# Patient Record
Sex: Female | Born: 1970 | Race: White | Hispanic: No | Smoking: Never smoker
Health system: Southern US, Community
[De-identification: ages and names within clinical notes are randomized; demographics above are authoritative.]

## PROBLEM LIST (undated history)

## (undated) DIAGNOSIS — E785 Hyperlipidemia, unspecified: Secondary | ICD-10-CM

## (undated) DIAGNOSIS — F32A Depression, unspecified: Secondary | ICD-10-CM

## (undated) DIAGNOSIS — I10 Essential (primary) hypertension: Secondary | ICD-10-CM

## (undated) DIAGNOSIS — I679 Cerebrovascular disease, unspecified: Secondary | ICD-10-CM

## (undated) DIAGNOSIS — E119 Type 2 diabetes mellitus without complications: Secondary | ICD-10-CM

## (undated) DIAGNOSIS — I639 Cerebral infarction, unspecified: Secondary | ICD-10-CM

## (undated) HISTORY — PX: OTHER SURGICAL HISTORY: SHX169

## (undated) HISTORY — DX: Cerebrovascular disease, unspecified: I67.9

---

## 2012-11-17 ENCOUNTER — Ambulatory Visit: Payer: Self-pay | Admitting: Endocrinology

## 2020-09-24 ENCOUNTER — Other Ambulatory Visit: Payer: Self-pay

## 2020-09-24 ENCOUNTER — Emergency Department (HOSPITAL_COMMUNITY): Payer: BC Managed Care – PPO

## 2020-09-24 ENCOUNTER — Inpatient Hospital Stay (HOSPITAL_COMMUNITY)
Admission: EM | Admit: 2020-09-24 | Discharge: 2020-10-01 | DRG: 065 | Disposition: A | Discharge status: Rehabilitation Facility | Payer: BC Managed Care – PPO | Attending: Internal Medicine | Admitting: Internal Medicine

## 2020-09-24 ENCOUNTER — Encounter (HOSPITAL_COMMUNITY): Payer: Self-pay | Admitting: Radiology

## 2020-09-24 ENCOUNTER — Observation Stay (HOSPITAL_COMMUNITY): Payer: BC Managed Care – PPO

## 2020-09-24 DIAGNOSIS — I6302 Cerebral infarction due to thrombosis of basilar artery: Secondary | ICD-10-CM

## 2020-09-24 DIAGNOSIS — E872 Acidosis: Secondary | ICD-10-CM | POA: Diagnosis present

## 2020-09-24 DIAGNOSIS — E871 Hypo-osmolality and hyponatremia: Secondary | ICD-10-CM | POA: Diagnosis not present

## 2020-09-24 DIAGNOSIS — E781 Pure hyperglyceridemia: Secondary | ICD-10-CM | POA: Diagnosis present

## 2020-09-24 DIAGNOSIS — Z823 Family history of stroke: Secondary | ICD-10-CM

## 2020-09-24 DIAGNOSIS — Z79899 Other long term (current) drug therapy: Secondary | ICD-10-CM

## 2020-09-24 DIAGNOSIS — G8191 Hemiplegia, unspecified affecting right dominant side: Secondary | ICD-10-CM | POA: Diagnosis present

## 2020-09-24 DIAGNOSIS — D751 Secondary polycythemia: Secondary | ICD-10-CM | POA: Diagnosis not present

## 2020-09-24 DIAGNOSIS — R29810 Facial weakness: Secondary | ICD-10-CM | POA: Diagnosis present

## 2020-09-24 DIAGNOSIS — R739 Hyperglycemia, unspecified: Secondary | ICD-10-CM | POA: Diagnosis not present

## 2020-09-24 DIAGNOSIS — F32A Depression, unspecified: Secondary | ICD-10-CM | POA: Diagnosis present

## 2020-09-24 DIAGNOSIS — B373 Candidiasis of vulva and vagina: Secondary | ICD-10-CM | POA: Diagnosis not present

## 2020-09-24 DIAGNOSIS — I6322 Cerebral infarction due to unspecified occlusion or stenosis of basilar arteries: Principal | ICD-10-CM | POA: Diagnosis present

## 2020-09-24 DIAGNOSIS — E785 Hyperlipidemia, unspecified: Secondary | ICD-10-CM | POA: Diagnosis present

## 2020-09-24 DIAGNOSIS — I639 Cerebral infarction, unspecified: Secondary | ICD-10-CM | POA: Diagnosis present

## 2020-09-24 DIAGNOSIS — Z20822 Contact with and (suspected) exposure to covid-19: Secondary | ICD-10-CM | POA: Diagnosis present

## 2020-09-24 DIAGNOSIS — I10 Essential (primary) hypertension: Secondary | ICD-10-CM | POA: Diagnosis present

## 2020-09-24 DIAGNOSIS — E119 Type 2 diabetes mellitus without complications: Secondary | ICD-10-CM

## 2020-09-24 DIAGNOSIS — Z880 Allergy status to penicillin: Secondary | ICD-10-CM

## 2020-09-24 DIAGNOSIS — E669 Obesity, unspecified: Secondary | ICD-10-CM | POA: Diagnosis present

## 2020-09-24 DIAGNOSIS — R260 Ataxic gait: Secondary | ICD-10-CM | POA: Diagnosis present

## 2020-09-24 DIAGNOSIS — Z87892 Personal history of anaphylaxis: Secondary | ICD-10-CM

## 2020-09-24 DIAGNOSIS — E1165 Type 2 diabetes mellitus with hyperglycemia: Secondary | ICD-10-CM

## 2020-09-24 DIAGNOSIS — E1159 Type 2 diabetes mellitus with other circulatory complications: Secondary | ICD-10-CM

## 2020-09-24 DIAGNOSIS — I809 Phlebitis and thrombophlebitis of unspecified site: Secondary | ICD-10-CM | POA: Diagnosis present

## 2020-09-24 DIAGNOSIS — Z9181 History of falling: Secondary | ICD-10-CM

## 2020-09-24 DIAGNOSIS — F419 Anxiety disorder, unspecified: Secondary | ICD-10-CM | POA: Diagnosis present

## 2020-09-24 DIAGNOSIS — R29703 NIHSS score 3: Secondary | ICD-10-CM | POA: Diagnosis present

## 2020-09-24 DIAGNOSIS — Z888 Allergy status to other drugs, medicaments and biological substances status: Secondary | ICD-10-CM

## 2020-09-24 HISTORY — DX: Depression, unspecified: F32.A

## 2020-09-24 HISTORY — DX: Type 2 diabetes mellitus without complications: E11.9

## 2020-09-24 HISTORY — DX: Hyperlipidemia, unspecified: E78.5

## 2020-09-24 HISTORY — DX: Essential (primary) hypertension: I10

## 2020-09-24 LAB — CBC
HCT: 46.2 % — ABNORMAL HIGH (ref 36.0–46.0)
Hemoglobin: 15.2 g/dL — ABNORMAL HIGH (ref 12.0–15.0)
MCH: 25.9 pg — ABNORMAL LOW (ref 26.0–34.0)
MCHC: 32.9 g/dL (ref 30.0–36.0)
MCV: 78.6 fL — ABNORMAL LOW (ref 80.0–100.0)
Platelets: 247 10*3/uL (ref 150–400)
RBC: 5.88 MIL/uL — ABNORMAL HIGH (ref 3.87–5.11)
RDW: 14.6 % (ref 11.5–15.5)
WBC: 10.7 10*3/uL — ABNORMAL HIGH (ref 4.0–10.5)
nRBC: 0 % (ref 0.0–0.2)

## 2020-09-24 LAB — RESP PANEL BY RT-PCR (FLU A&B, COVID) ARPGX2
Influenza A by PCR: NEGATIVE
Influenza B by PCR: NEGATIVE
SARS Coronavirus 2 by RT PCR: NEGATIVE

## 2020-09-24 LAB — DIFFERENTIAL
Abs Immature Granulocytes: 0.03 10*3/uL (ref 0.00–0.07)
Basophils Absolute: 0.1 10*3/uL (ref 0.0–0.1)
Basophils Relative: 1 %
Eosinophils Absolute: 0.2 10*3/uL (ref 0.0–0.5)
Eosinophils Relative: 2 %
Immature Granulocytes: 0 %
Lymphocytes Relative: 24 %
Lymphs Abs: 2.5 10*3/uL (ref 0.7–4.0)
Monocytes Absolute: 0.6 10*3/uL (ref 0.1–1.0)
Monocytes Relative: 6 %
Neutro Abs: 7.2 10*3/uL (ref 1.7–7.7)
Neutrophils Relative %: 67 %

## 2020-09-24 LAB — URINALYSIS, ROUTINE W REFLEX MICROSCOPIC
Bilirubin Urine: NEGATIVE
Glucose, UA: >=500 mg/dL — AB
Hgb urine dipstick: NEGATIVE
Ketones, ur: 80 mg/dL — AB
Nitrite: POSITIVE — AB
Protein, ur: NEGATIVE mg/dL
Specific Gravity, Urine: >1.046 — ABNORMAL HIGH (ref 1.005–1.030)
pH: 5 (ref 5.0–8.0)

## 2020-09-24 LAB — COMPREHENSIVE METABOLIC PANEL
ALT: 19 U/L (ref 0–44)
AST: 14 U/L — ABNORMAL LOW (ref 15–41)
Albumin: 3.7 g/dL (ref 3.5–5.0)
Alkaline Phosphatase: 66 U/L (ref 38–126)
Anion gap: 12 (ref 5–15)
BUN: 13 mg/dL (ref 6–20)
CO2: 22 mmol/L (ref 22–32)
Calcium: 9.1 mg/dL (ref 8.9–10.3)
Chloride: 99 mmol/L (ref 98–111)
Creatinine, Ser: 0.59 mg/dL (ref 0.44–1.00)
GFR, Estimated: >60 mL/min (ref >60)
Glucose, Bld: 310 mg/dL — ABNORMAL HIGH (ref 70–99)
Potassium: 4 mmol/L (ref 3.5–5.1)
Sodium: 133 mmol/L — ABNORMAL LOW (ref 135–145)
Total Bilirubin: 1.5 mg/dL — ABNORMAL HIGH (ref 0.3–1.2)
Total Protein: 6.5 g/dL (ref 6.5–8.1)

## 2020-09-24 LAB — I-STAT CHEM 8, ED
BUN: 13 mg/dL (ref 6–20)
Calcium, Ion: 1.13 mmol/L — ABNORMAL LOW (ref 1.15–1.40)
Chloride: 101 mmol/L (ref 98–111)
Creatinine, Ser: 0.4 mg/dL — ABNORMAL LOW (ref 0.44–1.00)
Glucose, Bld: 314 mg/dL — ABNORMAL HIGH (ref 70–99)
HCT: 47 % — ABNORMAL HIGH (ref 36.0–46.0)
Hemoglobin: 16 g/dL — ABNORMAL HIGH (ref 12.0–15.0)
Potassium: 4.1 mmol/L (ref 3.5–5.1)
Sodium: 135 mmol/L (ref 135–145)
TCO2: 23 mmol/L (ref 22–32)

## 2020-09-24 LAB — CBG MONITORING, ED
Glucose-Capillary: 299 mg/dL — ABNORMAL HIGH (ref 70–99)
Glucose-Capillary: 295 mg/dL — ABNORMAL HIGH (ref 70–99)

## 2020-09-24 LAB — I-STAT BETA HCG BLOOD, ED (MC, WL, AP ONLY): I-stat hCG, quantitative: <5 m[IU]/mL (ref <5)

## 2020-09-24 LAB — PROTIME-INR
INR: 1 (ref 0.8–1.2)
Prothrombin Time: 13 seconds (ref 11.4–15.2)

## 2020-09-24 LAB — APTT: aPTT: 23 seconds — ABNORMAL LOW (ref 24–36)

## 2020-09-24 LAB — CK: Total CK: 112 U/L (ref 38–234)

## 2020-09-24 MED ORDER — STROKE: EARLY STAGES OF RECOVERY BOOK
Freq: Once | Status: AC
  Filled 2020-09-24: qty 1

## 2020-09-24 MED ORDER — LORAZEPAM 2 MG/ML IJ SOLN
0.5000 mg | Freq: Once | INTRAMUSCULAR | Status: AC
  Administered 2020-09-24: 0.5 mg via INTRAVENOUS
  Filled 2020-09-24: qty 1

## 2020-09-24 MED ORDER — INSULIN ASPART 100 UNIT/ML IJ SOLN
0.0000 [IU] | Freq: Three times a day (TID) | INTRAMUSCULAR | Status: DC
  Administered 2020-09-24: 3 [IU] via SUBCUTANEOUS
  Administered 2020-09-25: 4 [IU] via SUBCUTANEOUS

## 2020-09-24 MED ORDER — SODIUM CHLORIDE 0.9 % IV BOLUS
1000.0000 mL | Freq: Once | INTRAVENOUS | Status: AC
  Administered 2020-09-24: 1000 mL via INTRAVENOUS

## 2020-09-24 MED ORDER — IOHEXOL 350 MG/ML SOLN
75.0000 mL | Freq: Once | INTRAVENOUS | Status: AC | PRN
  Administered 2020-09-24: 75 mL via INTRAVENOUS

## 2020-09-24 MED ORDER — SODIUM CHLORIDE 0.9% FLUSH
3.0000 mL | Freq: Once | INTRAVENOUS | Status: AC
Start: 2020-09-24 — End: 2020-09-24
  Administered 2020-09-24: 3 mL via INTRAVENOUS

## 2020-09-24 MED ORDER — ASPIRIN 325 MG PO TABS
325.0000 mg | ORAL_TABLET | Freq: Every day | ORAL | Status: DC
  Administered 2020-09-24 – 2020-09-25 (×2): 325 mg via ORAL
  Filled 2020-09-24 (×2): qty 1

## 2020-09-24 MED ORDER — ACETAMINOPHEN 325 MG PO TABS
650.0000 mg | ORAL_TABLET | Freq: Four times a day (QID) | ORAL | Status: DC | PRN
  Administered 2020-09-30 (×2): 650 mg via ORAL
  Filled 2020-09-24 (×2): qty 2

## 2020-09-24 MED ORDER — ACETAMINOPHEN 650 MG RE SUPP: 650.0000 mg | Freq: Four times a day (QID) | RECTAL | Status: DC | PRN

## 2020-09-24 NOTE — Consult Note (Signed)
Neurology Consultation Reason for Consult: Right-sided weakness Referring Physician: Para Skeans  CC: Right-sided weakness  History is obtained from: Patient  HPI: Amber [name] is a 50 y.o. female with a history of hypertension who presents with right-sided weakness that has been going on since July 4.  She states that around noon on July 4 she started having right-sided weakness.  Yesterday she fell and was down for about 20 hours.  She states that the right-sided weakness, however has been fairly static since onset.  When her mother found her today, she called 911 to have the patient brought into the emergency department.  Despite being symptomatic for 48 hours,  EMS activated a code stroke and the patient was taken for emergent CT which demonstrated a pontine infarct.  CTA reveals no LVO.   LKW: July 4 tpa given?: no, outside of window   ROS: A 14 point ROS was performed and is negative except as noted in the HPI.    Past Medical History:  Diagnosis Date   Depression    HTN (hypertension)      Family medical history: Mother-stroke   Social History: She denies tobacco Exam: Current vital signs: BP (!) 157/87   Pulse 88   Temp 97.9 F (36.6 C) (Oral)   Resp 12   SpO2 97%  Vital signs in last 24 hours: Temp:  [97.9 F (36.6 C)] 97.9 F (36.6 C) (07/06 1655) Pulse Rate:  [88-98] 88 (07/06 1800) Resp:  [11-18] 12 (07/06 1800) BP: (149-163)/(76-87) 157/87 (07/06 1800) SpO2:  [94 %-100 %] 97 % (07/06 1800)   Physical Exam  Constitutional: Appears well-developed and well-nourished.  Psych: Affect appropriate to situation Eyes: No scleral injection HENT: No OP obstruction MSK: no joint deformities.  Cardiovascular: Normal rate and regular rhythm.  Respiratory: Effort normal, non-labored breathing GI: Soft.  No distension. There is no tenderness.  Skin: WDI  Neuro: Mental Status: Patient is awake, alert, oriented to person, place, month, year, and  situation. Patient is able to give a clear and coherent history. No signs of aphasia or neglect Cranial Nerves: II: Visual Fields are full. Pupils are equal, round, and reactive to light.   III,IV, VI: EOMI without ptosis or diploplia.  V: Facial sensation is symmetric to temperature VII: Facial movement with right facial weakness.  VIII: hearing is intact to voice X: Uvula elevates symmetrically XI: Shoulder shrug is symmetric. XII: tongue is midline without atrophy or fasciculations.  Motor: Tone is normal. Bulk is normal. 5/5 strength was present on the left, she has 4/5 weakness of the right arm and leg.  Sensory: Sensation is diminished in the right arm  Cerebellar: Ataxia out of proportion to weakness in the right arm and leg.      I have reviewed labs in epic and the results pertinent to this consultation are: Cr 0.59 Glucose 310  I have reviewed the images obtained:CT head- pontine and thalamic strokes  Impression: 50 year old female with likely small vessel ischemic stroke.  I suspect that the pontine stroke is acute, and she will need to be admitted for physical therapy and secondary risk factor modification.  Recommendations: - HgbA1c, fasting lipid panel - MRI  of the brain without contrast - Frequent neuro checks - Echocardiogram - Prophylactic therapy-Antiplatelet med: Aspirin - dose 325mg  PO or 300mg  PR - Risk factor modification - Telemetry monitoring - PT consult, OT consult, Speech consult - Stroke team to follow    , MD [facility] [phone]  If 7pm- 7am, please page neurology on call as listed in [place].

## 2020-09-24 NOTE — ED Notes (Signed)
Patient transported to MRI 
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
## 2020-09-25 ENCOUNTER — Encounter (HOSPITAL_COMMUNITY): Payer: Self-pay | Admitting: Internal Medicine

## 2020-09-25 ENCOUNTER — Observation Stay (HOSPITAL_COMMUNITY): Payer: BC Managed Care – PPO

## 2020-09-25 DIAGNOSIS — G8191 Hemiplegia, unspecified affecting right dominant side: Secondary | ICD-10-CM | POA: Diagnosis present

## 2020-09-25 DIAGNOSIS — E872 Acidosis: Secondary | ICD-10-CM | POA: Diagnosis present

## 2020-09-25 DIAGNOSIS — I10 Essential (primary) hypertension: Secondary | ICD-10-CM | POA: Diagnosis present

## 2020-09-25 DIAGNOSIS — F32A Depression, unspecified: Secondary | ICD-10-CM | POA: Diagnosis present

## 2020-09-25 DIAGNOSIS — E669 Obesity, unspecified: Secondary | ICD-10-CM | POA: Diagnosis present

## 2020-09-25 DIAGNOSIS — R29703 NIHSS score 3: Secondary | ICD-10-CM | POA: Diagnosis present

## 2020-09-25 DIAGNOSIS — E781 Pure hyperglyceridemia: Secondary | ICD-10-CM | POA: Diagnosis present

## 2020-09-25 DIAGNOSIS — I6389 Other cerebral infarction: Secondary | ICD-10-CM | POA: Diagnosis not present

## 2020-09-25 DIAGNOSIS — E1165 Type 2 diabetes mellitus with hyperglycemia: Secondary | ICD-10-CM | POA: Diagnosis present

## 2020-09-25 DIAGNOSIS — E785 Hyperlipidemia, unspecified: Secondary | ICD-10-CM | POA: Diagnosis present

## 2020-09-25 DIAGNOSIS — Z79899 Other long term (current) drug therapy: Secondary | ICD-10-CM | POA: Diagnosis not present

## 2020-09-25 DIAGNOSIS — R739 Hyperglycemia, unspecified: Secondary | ICD-10-CM | POA: Diagnosis present

## 2020-09-25 DIAGNOSIS — I809 Phlebitis and thrombophlebitis of unspecified site: Secondary | ICD-10-CM | POA: Diagnosis present

## 2020-09-25 DIAGNOSIS — Z888 Allergy status to other drugs, medicaments and biological substances status: Secondary | ICD-10-CM | POA: Diagnosis not present

## 2020-09-25 DIAGNOSIS — Z880 Allergy status to penicillin: Secondary | ICD-10-CM | POA: Diagnosis not present

## 2020-09-25 DIAGNOSIS — D751 Secondary polycythemia: Secondary | ICD-10-CM | POA: Diagnosis not present

## 2020-09-25 DIAGNOSIS — B373 Candidiasis of vulva and vagina: Secondary | ICD-10-CM | POA: Diagnosis not present

## 2020-09-25 DIAGNOSIS — Z9181 History of falling: Secondary | ICD-10-CM | POA: Diagnosis not present

## 2020-09-25 DIAGNOSIS — I6322 Cerebral infarction due to unspecified occlusion or stenosis of basilar arteries: Secondary | ICD-10-CM | POA: Diagnosis present

## 2020-09-25 DIAGNOSIS — R260 Ataxic gait: Secondary | ICD-10-CM | POA: Diagnosis present

## 2020-09-25 DIAGNOSIS — Z87892 Personal history of anaphylaxis: Secondary | ICD-10-CM | POA: Diagnosis not present

## 2020-09-25 DIAGNOSIS — Z20822 Contact with and (suspected) exposure to covid-19: Secondary | ICD-10-CM | POA: Diagnosis present

## 2020-09-25 DIAGNOSIS — E119 Type 2 diabetes mellitus without complications: Secondary | ICD-10-CM

## 2020-09-25 DIAGNOSIS — R29810 Facial weakness: Secondary | ICD-10-CM | POA: Diagnosis present

## 2020-09-25 DIAGNOSIS — I639 Cerebral infarction, unspecified: Secondary | ICD-10-CM | POA: Diagnosis present

## 2020-09-25 DIAGNOSIS — E871 Hypo-osmolality and hyponatremia: Secondary | ICD-10-CM | POA: Diagnosis not present

## 2020-09-25 DIAGNOSIS — F419 Anxiety disorder, unspecified: Secondary | ICD-10-CM | POA: Diagnosis present

## 2020-09-25 LAB — CBC
HCT: 42.9 % (ref 36.0–46.0)
Hemoglobin: 13.5 g/dL (ref 12.0–15.0)
MCH: 25.5 pg — ABNORMAL LOW (ref 26.0–34.0)
MCHC: 31.5 g/dL (ref 30.0–36.0)
MCV: 81.1 fL (ref 80.0–100.0)
Platelets: 239 K/uL (ref 150–400)
RBC: 5.29 MIL/uL — ABNORMAL HIGH (ref 3.87–5.11)
RDW: 14.7 % (ref 11.5–15.5)
WBC: 8.8 K/uL (ref 4.0–10.5)
nRBC: 0 % (ref 0.0–0.2)

## 2020-09-25 LAB — LIPID PANEL
Cholesterol: 212 mg/dL — ABNORMAL HIGH (ref 0–200)
HDL: 30 mg/dL — ABNORMAL LOW (ref >40)
LDL Cholesterol: 107 mg/dL — ABNORMAL HIGH (ref 0–99)
Total CHOL/HDL Ratio: 7.1 RATIO
Triglycerides: 377 mg/dL — ABNORMAL HIGH (ref <150)
VLDL: 75 mg/dL — ABNORMAL HIGH (ref 0–40)

## 2020-09-25 LAB — ECHOCARDIOGRAM COMPLETE BUBBLE STUDY
Area-P 1/2: 3.91 cm2
S' Lateral: 3.8 cm

## 2020-09-25 LAB — COMPREHENSIVE METABOLIC PANEL
ALT: 17 U/L (ref 0–44)
AST: 12 U/L — ABNORMAL LOW (ref 15–41)
Albumin: 3.3 g/dL — ABNORMAL LOW (ref 3.5–5.0)
Alkaline Phosphatase: 64 U/L (ref 38–126)
Anion gap: 8 (ref 5–15)
BUN: 9 mg/dL (ref 6–20)
CO2: 22 mmol/L (ref 22–32)
Calcium: 8.6 mg/dL — ABNORMAL LOW (ref 8.9–10.3)
Chloride: 105 mmol/L (ref 98–111)
Creatinine, Ser: 0.54 mg/dL (ref 0.44–1.00)
GFR, Estimated: >60 mL/min (ref >60)
Glucose, Bld: 351 mg/dL — ABNORMAL HIGH (ref 70–99)
Potassium: 3.8 mmol/L (ref 3.5–5.1)
Sodium: 135 mmol/L (ref 135–145)
Total Bilirubin: 1.1 mg/dL (ref 0.3–1.2)
Total Protein: 6 g/dL — ABNORMAL LOW (ref 6.5–8.1)

## 2020-09-25 LAB — CBG MONITORING, ED
Glucose-Capillary: 330 mg/dL — ABNORMAL HIGH (ref 70–99)
Glucose-Capillary: 308 mg/dL — ABNORMAL HIGH (ref 70–99)
Glucose-Capillary: 313 mg/dL — ABNORMAL HIGH (ref 70–99)

## 2020-09-25 LAB — HEMOGLOBIN A1C
Hgb A1c MFr Bld: 11.7 % — ABNORMAL HIGH (ref 4.8–5.6)
Mean Plasma Glucose: 289.09 mg/dL

## 2020-09-25 LAB — GLUCOSE, CAPILLARY: Glucose-Capillary: 291 mg/dL — ABNORMAL HIGH (ref 70–99)

## 2020-09-25 LAB — HIV ANTIBODY (ROUTINE TESTING W REFLEX): HIV Screen 4th Generation wRfx: NONREACTIVE

## 2020-09-25 LAB — MAGNESIUM: Magnesium: 1.8 mg/dL (ref 1.7–2.4)

## 2020-09-25 MED ORDER — LACTATED RINGERS IV SOLN: INTRAVENOUS | Status: DC

## 2020-09-25 MED ORDER — CLOPIDOGREL BISULFATE 300 MG PO TABS
300.0000 mg | ORAL_TABLET | Freq: Once | ORAL | Status: AC
  Administered 2020-09-25: 300 mg via ORAL
  Filled 2020-09-25: qty 1

## 2020-09-25 MED ORDER — SERTRALINE HCL 100 MG PO TABS
100.0000 mg | ORAL_TABLET | Freq: Every day | ORAL | Status: DC
  Administered 2020-09-25 – 2020-10-01 (×7): 100 mg via ORAL
  Filled 2020-09-25 (×7): qty 1

## 2020-09-25 MED ORDER — INSULIN GLARGINE 100 UNIT/ML ~~LOC~~ SOLN
10.0000 [IU] | Freq: Every day | SUBCUTANEOUS | Status: DC
  Administered 2020-09-25: 10 [IU] via SUBCUTANEOUS
  Filled 2020-09-25 (×3): qty 0.1

## 2020-09-25 MED ORDER — INSULIN ASPART 100 UNIT/ML IJ SOLN
0.0000 [IU] | Freq: Every day | INTRAMUSCULAR | Status: DC
  Administered 2020-09-25: 3 [IU] via SUBCUTANEOUS
  Administered 2020-09-26: 2 [IU] via SUBCUTANEOUS
  Administered 2020-09-27 – 2020-09-29 (×3): 3 [IU] via SUBCUTANEOUS
  Administered 2020-09-30: 2 [IU] via SUBCUTANEOUS

## 2020-09-25 MED ORDER — ASPIRIN EC 81 MG PO TBEC
81.0000 mg | DELAYED_RELEASE_TABLET | Freq: Every day | ORAL | Status: DC
  Administered 2020-09-26 – 2020-10-01 (×6): 81 mg via ORAL
  Filled 2020-09-25 (×6): qty 1

## 2020-09-25 MED ORDER — INSULIN ASPART 100 UNIT/ML IJ SOLN
0.0000 [IU] | Freq: Three times a day (TID) | INTRAMUSCULAR | Status: DC
  Administered 2020-09-25 (×2): 15 [IU] via SUBCUTANEOUS
  Administered 2020-09-26: 11 [IU] via SUBCUTANEOUS
  Administered 2020-09-26: 20 [IU] via SUBCUTANEOUS
  Administered 2020-09-26: 15 [IU] via SUBCUTANEOUS
  Administered 2020-09-27: 4 [IU] via SUBCUTANEOUS
  Administered 2020-09-27: 11 [IU] via SUBCUTANEOUS
  Administered 2020-09-27: 7 [IU] via SUBCUTANEOUS
  Administered 2020-09-28 (×2): 11 [IU] via SUBCUTANEOUS
  Administered 2020-09-28: 4 [IU] via SUBCUTANEOUS
  Administered 2020-09-29 (×2): 11 [IU] via SUBCUTANEOUS
  Administered 2020-09-29: 4 [IU] via SUBCUTANEOUS
  Administered 2020-09-30: 11 [IU] via SUBCUTANEOUS
  Administered 2020-09-30 – 2020-10-01 (×2): 7 [IU] via SUBCUTANEOUS
  Administered 2020-10-01: 11 [IU] via SUBCUTANEOUS

## 2020-09-25 MED ORDER — ATORVASTATIN CALCIUM 80 MG PO TABS
80.0000 mg | ORAL_TABLET | Freq: Every day | ORAL | Status: DC
  Administered 2020-09-25: 80 mg via ORAL
  Filled 2020-09-25: qty 1

## 2020-09-25 MED ORDER — CLOPIDOGREL BISULFATE 75 MG PO TABS
75.0000 mg | ORAL_TABLET | Freq: Every day | ORAL | Status: DC
  Administered 2020-09-26 – 2020-10-01 (×6): 75 mg via ORAL
  Filled 2020-09-25 (×6): qty 1

## 2020-09-25 NOTE — Progress Notes (Signed)
Rehab Admissions Coordinator Note:  Patient was screened by Clois Dupes for appropriateness for an Inpatient Acute Rehab Consult per therapy recs.   At this time, we are recommending Inpatient Rehab consult. I will place order per protocol.  Clois Dupes RN MSN 09/25/2020, 5:41 PM  I can be reached at [phone].
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
## 2020-09-25 NOTE — Progress Notes (Addendum)
Inpatient Diabetes Program Recommendations  AACE/ADA: New Consensus Statement on Inpatient Glycemic Control (2015)  Target Ranges:  Prepandial:   less than 140 mg/dL      Peak postprandial:   less than 180 mg/dL (1-2 hours)      Critically ill patients:  140 - 180 mg/dL   Results for Amber [name], Amber [name] (MRN [id number]) as of 09/25/2020 09:59  Ref. Range 09/24/2020 16:23 09/24/2020 20:32 09/25/2020 08:12  Glucose-Capillary Latest Ref Range: 70 - 99 mg/dL 737 (H) 106 (H)  3 units NOVOLOG  330 (H)  4 units NOVOLOG    Results for Amber [name], Amber [name] (MRN [id number]) as of 09/25/2020 09:59  Ref. Range 09/25/2020 03:47  Hemoglobin A1C Latest Ref Range: 4.8 - 5.6 % 11.7 (H)  (289 mg/dl)    Admit with: Acute ischemic CVA  History: DM  Home DM Meds: None listed       Pt states she takes the following: Glipizide 10 mg Daily, Novolog SSI BID, and Wegovy once Weekly injection  Current Orders: Novolog Resistant Correction Scale/ SSI (0-20 units) TID AC + HS     Lantus 10 units Daily     Note Lantus and Novolog SSi to start this AM   Spoke with pt around 12pm today.  Pt A&O and able to have meaningful conversation.  Pt told me she is in transition of seeing her new PCP--Has appt with Dr. Manson Passey with [facility] on 10/06/2020.  Told me she takes Glipizide 10 mg Daily + Novolog SSi BID.  Was taking Wegovy since October 2021, however, she ran out 2 months ago and has not taken any since then.  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  We reviewed her Current A1c of 11.7% and pt was frustrated to hear it was so high--stated her last A1c was 10.1% about 3 weeks ago.  Used to take Lantus insulin but stopped about 1 year ago.  Pt states she knows her A1c needs to be closer to 7% and she is willing to escalate her meds at home in order to achieve better  glucose control.  Did not have any further questions for me at this time and was very appreciative of visit.   --Will follow patient during hospitalization--  Ambrose Finland RN, MSN, CDE Diabetes Coordinator Inpatient Glycemic Control Team Team Pager: [phone] (8a-5p)

## 2020-09-25 NOTE — Progress Notes (Signed)
[facility] PROGRESS NOTE    Amber [name]  QVZ:[id number] DOB: [birth year] DOA: 09/24/2020 PCP: Pcp, No      Brief Narrative:  Amber [name] is a 50 y.o. F with HTN, DM, who presented with right-sided weakness.  2 days prior to admission, the patient had sudden onset right hemiparesis and right facial droop.  She did not want to inconvenience anyone to drive her to the ER, so she waited until 7/6 to present.  In the ER, CT head suggested new stroke.  Neurology were consulted.         Assessment & Plan:  Acute stroke MRI shows left pons infarct. -Non-invasive angiography showed intracranial atherosclerosis not adjacent to suspect lesion -Echocardiogram shows no cardiac source -Carotid imaging without significant stenosis   -Lipids ordered: started on atorvastatin -Aspirin ordered at admission --> continue aspirin and Plavix three months -Atrial fibrillation: None on tele -tPA not given because outside window -Dysphagia screen ordered in ER -PT eval orderered -Smoking cessation: not pertinent, nonsmoker      Diabetes Glucoses elevated A1c 11% - Continue Lantus - Increase SS corrections   Anxiety -Continue sertraline  Hypertension BP normal now -Permissive HTN -Hold lisinopril HCTZ  Asymptomatic bacteriuria No treatement necessary        Disposition: Status is: Inpatient  Remains inpatient appropriate because: she has dense right hemiparesis and will require residential rehabilitation either inpatient rehab or SNF  Dispo: The patient is from: Home              Anticipated d/c is to:  TBD              Patient currently is not medically stable to d/c.   Difficult to place patient No           Level of care: Med-Surg       MDM: The below labs and imaging reports were reviewed and summarized above.  Medication management as above.   DVT prophylaxis: SCDs Start: 09/24/20 1825  Code Status: FULL Family Communication:               Subjective: She still has right-sided weakness.  No confusion, loss of consciousness, fever, chest pain, dyspnea.  Objective: Vitals:   09/25/20 0600 09/25/20 0700 09/25/20 1000 09/25/20 1330  BP: 138/74 134/69 137/65 (!) 159/81  Pulse: 77 76 93 86  Resp: 15 17 17  (!) 21  Temp:      TempSrc:      SpO2: 93% 95% 95% 95%   No intake or output data in the 24 hours ending 09/25/20 1805 There were no vitals filed for this visit.  Examination: General appearance:  adult female, alert and in no acute distress.   HEENT: Anicteric, conjunctiva pink, lids and lashes normal. No nasal deformity, discharge, epistaxis.  Lips moist.   Skin: Warm and dry.  no jaundice.  No suspicious rashes or lesions. Cardiac: RRR, nl S1-S2, no murmurs appreciated.  Capillary refill is brisk.  JVPnot visible.  No LE edema.  Radial  pulses 2+ and symmetric. Respiratory: Normal respiratory rate and rhythm.  CTAB without rales or wheezes. Abdomen: Abdomen soft.  no TTP. No ascites, distension, hepatosplenomegaly.   MSK: No deformities or effusions. Neuro: Awake and alert.  EOMI, she has right-sided weakness in the right arm and leg, barely able to lift the right arm.  She has right-sided facial droop.11/26/20 Speech fluent.    Psych: Sensorium intact and responding to questions, attention normal. Affect normal.  Judgment  and insight appear normal.    Data Reviewed: I have personally reviewed following labs and imaging studies:  CBC: Recent Labs  Lab 09/24/20 1628 09/24/20 1634 09/25/20 0347  WBC 10.7*  --  8.8  NEUTROABS 7.2  --   --   HGB 15.2* 16.0* 13.5  HCT 46.2* 47.0* 42.9  MCV 78.6*  --  81.1  PLT 247  --  239   Basic Metabolic Panel: Recent Labs  Lab 09/24/20 1628 09/24/20 1634 09/25/20 0347  NA 133* 135 135  K 4.0 4.1 3.8  CL 99 101 105  CO2 22  --  22  GLUCOSE 310* 314* 351*  BUN 13 13 9   CREATININE 0.59 0.40* 0.54  CALCIUM 9.1  --  8.6*  MG  --   --  1.8    GFR: CrCl cannot be calculated (Unknown ideal weight.). Liver Function Tests: Recent Labs  Lab 09/24/20 1628 09/25/20 0347  AST 14* 12*  ALT 19 17  ALKPHOS 66 64  BILITOT 1.5* 1.1  PROT 6.5 6.0*  ALBUMIN 3.7 3.3*   No results for input(s): LIPASE, AMYLASE in the last 168 hours. No results for input(s): AMMONIA in the last 168 hours. Coagulation Profile: Recent Labs  Lab 09/24/20 1628  INR 1.0   Cardiac Enzymes: Recent Labs  Lab 09/24/20 1628  CKTOTAL 112   BNP (last 3 results) No results for input(s): PROBNP in the last 8760 hours. HbA1C: Recent Labs    09/25/20 0347  HGBA1C 11.7*   CBG: Recent Labs  Lab 09/24/20 1623 09/24/20 2032 09/25/20 0812 09/25/20 1159 09/25/20 1641  GLUCAP 299* 295* 330* 308* 313*   Lipid Profile: Recent Labs    09/25/20 0348  CHOL 212*  HDL 30*  LDLCALC 107*  TRIG 377*  CHOLHDL 7.1   Thyroid Function Tests: No results for input(s): TSH, T4TOTAL, FREET4, T3FREE, THYROIDAB in the last 72 hours. Anemia Panel: No results for input(s): VITAMINB12, FOLATE, FERRITIN, TIBC, IRON, RETICCTPCT in the last 72 hours. Urine analysis:    Component Value Date/Time   COLORURINE YELLOW 09/24/2020 1919   APPEARANCEUR HAZY (A) 09/24/2020 1919   LABSPEC >1.046 (H) 09/24/2020 1919   PHURINE 5.0 09/24/2020 1919   GLUCOSEU >=500 (A) 09/24/2020 1919   HGBUR NEGATIVE 09/24/2020 1919   BILIRUBINUR NEGATIVE 09/24/2020 1919   KETONESUR 80 (A) 09/24/2020 1919   PROTEINUR NEGATIVE 09/24/2020 1919   NITRITE POSITIVE (A) 09/24/2020 1919   LEUKOCYTESUR TRACE (A) 09/24/2020 1919   Sepsis Labs: @LABRCNTIP (procalcitonin:4,lacticacidven:4)  ) Recent Results (from the past 240 hour(s))  Resp Panel by RT-PCR (Flu A&B, Covid) Nasopharyngeal Swab     Status: None   Collection Time: 09/24/20  4:28 PM   Specimen: Nasopharyngeal Swab; Nasopharyngeal(NP) swabs in vial transport medium  Result Value Ref Range Status   SARS Coronavirus 2 by RT PCR  NEGATIVE NEGATIVE Final    Comment: (NOTE) SARS-CoV-2 target nucleic acids are NOT DETECTED.  The SARS-CoV-2 RNA is generally detectable in upper respiratory specimens during the acute phase of infection. The lowest concentration of SARS-CoV-2 viral copies this assay can detect is 138 copies/mL. A negative result does not preclude SARS-Cov-2 infection and should not be used as the sole basis for treatment or other patient management decisions. A negative result may occur with  improper specimen collection/handling, submission of specimen other than nasopharyngeal swab, presence of viral mutation(s) within the areas targeted by this assay, and inadequate number of viral copies(<138 copies/mL). A negative result must be combined with  clinical observations, patient history, and epidemiological information. The expected result is Negative.  Fact Sheet for Patients:  BloggerCourse.com  Fact Sheet for Healthcare Providers:  SeriousBroker.it  This test is no t yet approved or cleared by the [place] FDA and  has been authorized for detection and/or diagnosis of SARS-CoV-2 by FDA under an Emergency Use Authorization (EUA). This EUA will remain  in effect (meaning this test can be used) for the duration of the COVID-19 declaration under Section 564(b)(1) of the Act, 21 U.S.C.section 360bbb-3(b)(1), unless the authorization is terminated  or revoked sooner.       Influenza A by PCR NEGATIVE NEGATIVE Final   Influenza B by PCR NEGATIVE NEGATIVE Final    Comment: (NOTE) The Xpert Xpress SARS-CoV-2/FLU/RSV plus assay is intended as an aid in the diagnosis of influenza from Nasopharyngeal swab specimens and should not be used as a sole basis for treatment. Nasal washings and aspirates are unacceptable for Xpert Xpress SARS-CoV-2/FLU/RSV testing.  Fact Sheet for Patients: BloggerCourse.com  Fact Sheet for  Healthcare Providers: SeriousBroker.it  This test is not yet approved or cleared by the [place] FDA and has been authorized for detection and/or diagnosis of SARS-CoV-2 by FDA under an Emergency Use Authorization (EUA). This EUA will remain in effect (meaning this test can be used) for the duration of the COVID-19 declaration under Section 564(b)(1) of the Act, 21 U.S.C. section 360bbb-3(b)(1), unless the authorization is terminated or revoked.  Performed at [facility], [address] [place], [place] [address]          Radiology Studies: MR BRAIN WO CONTRAST  Result Date: 09/24/2020 CLINICAL DATA:  Suspected stroke EXAM: MRI HEAD WITHOUT CONTRAST TECHNIQUE: Multiplanar, multiecho pulse sequences of the brain and surrounding structures were obtained without intravenous contrast. COMPARISON:  None. FINDINGS: Brain: There is a 10 mm acute infarct within the left pons. No acute or chronic hemorrhage. Normal white matter signal, parenchymal volume and CSF spaces. The midline structures are normal. Vascular: Major flow voids are preserved. Skull and upper cervical spine: Normal calvarium and skull base. Visualized upper cervical spine and soft tissues are normal. Sinuses/Orbits:No paranasal sinus fluid levels or advanced mucosal thickening. No mastoid or middle ear effusion. Normal orbits. IMPRESSION: 10 mm acute infarct of the left pons. No hemorrhage or mass effect. Electronically Signed   By: Deatra Robinson M.D.   On: 09/24/2020 23:00   ECHOCARDIOGRAM COMPLETE BUBBLE STUDY  Result Date: 09/25/2020    ECHOCARDIOGRAM REPORT   Patient Name:   LODIE WAHEED Date of Exam: 09/25/2020 Medical Rec #:  [id number]      Height:       67.0 in Accession #:    [id number]     Weight:       260.0 lb Date of Birth:  1970-10-17       BSA:          2.261 m Patient Age:    50 years       BP:           143/41 mmHg Patient Gender: F              HR:           84 bpm. Exam  Location:  Inpatient Procedure: 2D Echo, Cardiac Doppler, Color Doppler and Saline Contrast Bubble            Study Indications:    Stroke I63.9  History:  Patient has no prior history of Echocardiogram examinations.                 Risk Factors:Hypertension, Diabetes and Dyslipidemia.  Sonographer:    Tiffany Dance Referring Phys: [id number] Angie Fava IMPRESSIONS  1. Left ventricular ejection fraction, by estimation, is 60 to 65%. The left ventricle has normal function. The left ventricle has no regional wall motion abnormalities. Left ventricular diastolic parameters are consistent with Grade II diastolic dysfunction (pseudonormalization).  2. Right ventricular systolic function is normal. The right ventricular size is normal. Tricuspid regurgitation signal is inadequate for assessing PA pressure.  3. Left atrial size was mildly dilated.  4. The mitral valve is normal in structure. No evidence of mitral valve regurgitation. No evidence of mitral stenosis.  5. The aortic valve is tricuspid. Aortic valve regurgitation is not visualized. No aortic stenosis is present.  6. The inferior vena cava is normal in size with greater than 50% respiratory variability, suggesting right atrial pressure of 3 mmHg.  7. Bubble study negative. FINDINGS  Left Ventricle: Left ventricular ejection fraction, by estimation, is 60 to 65%. The left ventricle has normal function. The left ventricle has no regional wall motion abnormalities. The left ventricular internal cavity size was normal in size. There is  no left ventricular hypertrophy. Left ventricular diastolic parameters are consistent with Grade II diastolic dysfunction (pseudonormalization). Right Ventricle: The right ventricular size is normal. No increase in right ventricular wall thickness. Right ventricular systolic function is normal. Tricuspid regurgitation signal is inadequate for assessing PA pressure. Left Atrium: Left atrial size was mildly dilated. Right  Atrium: Right atrial size was normal in size. Pericardium: There is no evidence of pericardial effusion. Mitral Valve: The mitral valve is normal in structure. No evidence of mitral valve regurgitation. No evidence of mitral valve stenosis. Tricuspid Valve: The tricuspid valve is normal in structure. Tricuspid valve regurgitation is trivial. Aortic Valve: The aortic valve is tricuspid. Aortic valve regurgitation is not visualized. No aortic stenosis is present. Pulmonic Valve: The pulmonic valve was normal in structure. Pulmonic valve regurgitation is not visualized. Aorta: The aortic root is normal in size and structure. Venous: The inferior vena cava is normal in size with greater than 50% respiratory variability, suggesting right atrial pressure of 3 mmHg. IAS/Shunts: Bubble study negative. Agitated saline contrast was given intravenously to evaluate for intracardiac shunting.  LEFT VENTRICLE PLAX 2D LVIDd:         4.80 cm  Diastology LVIDs:         3.80 cm  LV e' medial:    6.34 cm/s LV PW:         1.30 cm  LV E/e' medial:  14.2 LV IVS:        0.90 cm  LV e' lateral:   9.88 cm/s LVOT diam:     2.00 cm  LV E/e' lateral: 9.1 LV SV:         91 LV SV Index:   40 LVOT Area:     3.14 cm  RIGHT VENTRICLE             IVC RV Basal diam:  2.60 cm     IVC diam: 1.25 cm RV S prime:     14.50 cm/s TAPSE (M-mode): 2.5 cm LEFT ATRIUM             Index       RIGHT ATRIUM           Index LA diam:  3.90 cm 1.73 cm/m  RA Area:     11.00 cm LA Vol (A2C):   68.6 ml 30.35 ml/m RA Volume:   20.60 ml  9.11 ml/m LA Vol (A4C):   34.6 ml 15.31 ml/m LA Biplane Vol: 49.1 ml 21.72 ml/m  AORTIC VALVE LVOT Vmax:   126.00 cm/s LVOT Vmean:  93.800 cm/s LVOT VTI:    0.289 m  AORTA Ao Root diam: 3.00 cm Ao Asc diam:  2.80 cm MITRAL VALVE MV Area (PHT): 3.91 cm    SHUNTS MV Decel Time: 194 msec    Systemic VTI:  0.29 m MV E velocity: 90.30 cm/s  Systemic Diam: 2.00 cm MV A velocity: 64.60 cm/s MV E/A ratio:  1.40 Marca Anconaalton Mclean MD  Electronically signed by Marca Anconaalton Mclean MD Signature Date/Time: 09/25/2020/3:58:15 PM    Final    CT HEAD CODE STROKE WO CONTRAST  Result Date: 09/24/2020 CLINICAL DATA:  Code stroke.  Right-sided weakness. EXAM: CT HEAD WITHOUT CONTRAST TECHNIQUE: Contiguous axial images were obtained from the base of the skull through the vertex without intravenous contrast. COMPARISON:  None. FINDINGS: Brain: Abnormal hypoattenuation within the left pons and left thalamus, concerning for age indeterminate infarcts. No significant mass effect. No acute hemorrhage, hydrocephalus, midline shift, extra-axial fluid collection. Vascular: No hyperdense vessel identified. Calcific atherosclerosis. Skull: No acute fracture. Sinuses/Orbits: Mucosal thickening of a posterior right ethmoid air cell. Unremarkable orbits. Other: No mastoid effusions. IMPRESSION: Abnormal hypoattenuation within the left pons and left thalamus, concerning for age indeterminate infarcts that could be acute/subacute. Recommend MRI to further evaluate. Code stroke imaging results were communicated on 09/24/2020 at 4:36 pm to provider Dr. Amada JupiterKirkpatrick via telephone, who verbally acknowledged these results. Electronically Signed   By: Feliberto HartsFrederick S Jones MD   On: 09/24/2020 16:39   CT ANGIO HEAD NECK W WO CM (CODE STROKE)  Result Date: 09/24/2020 CLINICAL DATA:  Right-sided weakness. EXAM: CT ANGIOGRAPHY HEAD AND NECK TECHNIQUE: Multidetector CT imaging of the head and neck was performed using the standard protocol during bolus administration of intravenous contrast. Multiplanar CT image reconstructions and MIPs were obtained to evaluate the vascular anatomy. Carotid stenosis measurements (when applicable) are obtained utilizing NASCET criteria, using the distal internal carotid diameter as the denominator. CONTRAST:  75mL OMNIPAQUE IOHEXOL 350 MG/ML SOLN COMPARISON:  None. FINDINGS: CTA NECK FINDINGS Aortic arch: Normal variant 4 vessel aortic arch with the left  vertebral artery arising from the arch. Widely patent brachiocephalic and subclavian arteries. Right carotid system: Patent with a small amount of calcified plaque in the carotid bulb. No evidence of a significant stenosis or dissection. Left carotid system: Patent with a small to moderate amount of calcified and soft plaque at the carotid bifurcation. No evidence of a significant stenosis or dissection. Vertebral arteries: Patent and small bilaterally with the left being particularly hypoplastic. No evidence of a significant stenosis or dissection. Skeleton: Mild-to-moderate disc and moderate facet degeneration in the cervical spine. Other neck: No evidence of cervical lymphadenopathy or mass. Upper chest: Clear lung apices. Review of the MIP images confirms the above findings CTA HEAD FINDINGS Anterior circulation: The internal carotid arteries are patent from skull base to carotid termini with mild atherosclerotic irregularity but no significant stenosis. ACAs and MCAs are patent with mild-to-moderate branch vessel irregularity but no evidence of a proximal branch occlusion or significant proximal stenosis. No aneurysm is identified. Posterior circulation: The intracranial vertebral arteries are patent with the left ending in PICA. The basilar artery is patent and congenitally  small in caliber with diffuse irregularity as well as a moderate focal stenosis in its midportion. There are fetal origins of both PCAs. Both PCAs are patent with diffuse irregularity. There is a severe proximal right P2 stenosis. No aneurysm is identified. Venous sinuses: Patent. Anatomic variants: Fetal PCAs. Review of the MIP images confirms the above findings IMPRESSION: 1. No emergent large vessel occlusion. 2. 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 3. Cervical carotid atherosclerosis without stenosis. These results were communicated to Dr. Amada Jupiter at 4:57 pm on  09/24/2020 by text page via the [facility] messaging system. Electronically Signed   By: Sebastian Ache M.D.   On: 09/24/2020 17:06        Scheduled Meds:  aspirin EC  81 mg Oral Daily   atorvastatin  80 mg Oral QHS   [START ON 09/26/2020] clopidogrel  75 mg Oral Daily   insulin aspart  0-20 Units Subcutaneous TID WC   insulin aspart  0-5 Units Subcutaneous QHS   insulin glargine  10 Units Subcutaneous Daily   sertraline  100 mg Oral Daily   Continuous Infusions:     LOS: 0 days    Time spent: 25 minutes    Alberteen Sam, MD [facility] 09/25/2020, 6:05 PM     Please page though AMION or Epic secure chat:  For [employer], Higher education careers adviser

## 2020-09-25 NOTE — Progress Notes (Signed)
STROKE TEAM PROGRESS NOTE   INTERVAL HISTORY No one is at the bedside.  She presented with several days history of gait ataxia on imbalance and MRI scan shows left paramedian pontine infarct.  CT angiogram shows severe right posterior cerebral artery and moderate mid basilar artery stenosis.  LDL cholesterol 107 mg percent and hemoglobin A1c is 11.7.  Patient is interested in participating the sleep smart study.  Vitals:   09/25/20 0600 09/25/20 0700 09/25/20 1000 09/25/20 1330  BP: 138/74 134/69 137/65 (!) 159/81  Pulse: 77 76 93 86  Resp: 15 17 17  (!) 21  Temp:      TempSrc:      SpO2: 93% 95% 95% 95%   CBC:  Recent Labs  Lab 09/24/20 1628 09/24/20 1634 09/25/20 0347  WBC 10.7*  --  8.8  NEUTROABS 7.2  --   --   HGB 15.2* 16.0* 13.5  HCT 46.2* 47.0* 42.9  MCV 78.6*  --  81.1  PLT 247  --  239   Basic Metabolic Panel:  Recent Labs  Lab 09/24/20 1628 09/24/20 1634 09/25/20 0347  NA 133* 135 135  K 4.0 4.1 3.8  CL 99 101 105  CO2 22  --  22  GLUCOSE 310* 314* 351*  BUN 13 13 9   CREATININE 0.59 0.40* 0.54  CALCIUM 9.1  --  8.6*  MG  --   --  1.8   Lipid Panel:  Recent Labs  Lab 09/25/20 0348  CHOL 212*  TRIG 377*  HDL 30*  CHOLHDL 7.1  VLDL 75*  LDLCALC 107*   HgbA1c:  Recent Labs  Lab 09/25/20 0347  HGBA1C 11.7*   Urine Drug Screen: No results for input(s): LABOPIA, COCAINSCRNUR, LABBENZ, AMPHETMU, THCU, LABBARB in the last 168 hours.  Alcohol Level No results for input(s): ETH in the last 168 hours.  IMAGING past 24 hours MR BRAIN WO CONTRAST  Result Date: 09/24/2020 IMPRESSION: 10 mm acute infarct of the left pons. No hemorrhage or mass effect. Electronically Signed   By: 11/26/20 M.D.   On: 09/24/2020 23:00   CT HEAD CODE STROKE WO CONTRAST  Result Date: 09/24/2020 MPRESSION: Abnormal hypoattenuation within the left pons and left thalamus, concerning for age indeterminate infarcts that could be acute/subacute.   CT ANGIO HEAD NECK W WO CM  (CODE STROKE)  Result Date: 09/24/2020 IMPRESSION: 1. No emergent large vessel occlusion. 2. 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 3. Cervical carotid atherosclerosis without stenosis.   PHYSICAL EXAM Obese middle-age  . Afebrile. Head is nontraumatic. Neck is supple without bruit.    Cardiac exam no murmur or gallop. Lungs are clear to auscultation. Distal pulses are well felt. lady not in distress. Neurological Exam ;  Awake  Alert oriented x 3. Normal speech and language.eye movements full without nystagmus.but mild saccadic dysmetria on right greater than left lateral gaze.  Fundi were not visualized. Vision acuity and fields appear normal. Hearing is normal. Palatal movements are normal. Face symmetric. Tongue midline. Normal strength except diminished fine finger movements on the right and orbits lateral right upper extremity.  Impaired right finger-to-nose and knee to heel coordination., tone, reflexes and coordination. Normal sensation. Gait deferred.  NIH stroke scale 2.  Premorbid modified Rankin 0  ASSESSMENT/PLAN [name] is a 50 y.o. female with history of hypertension and depression presenting with right sided weakness.  CT head revealed a left pontine infarct.    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 Code Stroke CT head: abnormal hypoattenuation within the left pons and left thalamus CTA head & neck: intracranial atherosclerosis including severe proximal right P2 and moderate basilar artery stenoses. Cervical carotid atherosclerosis MRI brain: 10 mm acute infarct of the left pon  2D Echo pending LDL 107 HgbA1c 11.7 VTE prophylaxis - SCDs Diet: carb modified No antithrombotic prior to admission, now on aspirin 81 mg daily and clopidogrel 75 mg daily. Recommend aspirin 81mg  and clopidogrel 75mg  for 3 months then aspirin alone Therapy recommendations:   pending Disposition:  pending  Proximal right P2 and moderate basilar artery stenoses Plavix and aspirin for 3 months then aspirin alone Outpatient followup  Hypertension Home meds:  lisinopril-hydrochlorothiazide 20-12.5mg  bid Stable BP goal <130/90 Long-term BP goal normotensive  Hyperlipidemia Home meds:  none,  LDL 107, goal < 70 Add atorvastatin 80mg  daily  Continue statin at discharge  Diabetes type II Uncontrolled Home meds:  none HgbA1c 11.7, goal < 7.0 Diabetes consult CBGs Recent Labs    09/24/20 2032 09/25/20 0812 09/25/20 1159  GLUCAP 295* 330* 308*    SSI  Other Stroke Risk Factors Obesity, There is no height or weight on file to calculate BMI., BMI >/= 30 associated with increased stroke risk, recommend weight loss, diet and exercise as appropriate  Family hx stroke (mother)  Other Active Problems Anxiety/depression: Zoloft  Hospital day # 0  Lissy Olivencia-Simmons, ACNP-BC 09/25/2020 2:05pm  I have personally obtained history,examined this patient, reviewed notes, independently viewed imaging studies, participated in medical decision making and plan of care.ROS completed by me personally and pertinent positives fully documented  I have made any additions or clarifications directly to the above note. Agree with note above.  She presented with several day history of gait ataxia and weakness secondary to pontine infarct likely from symptomatic moderate basilar stenosis.  Recommend dual antiplatelet therapy of aspirin and Plavix for 3 months followed by aspirin alone and aggressive risk factor modification.  Continue ongoing stroke work-up.  Patient also appears to be at risk for sleep apnea and may consider possible participation in the sleep smart study if interested.  She will be given information to review and decide. Greater than 50% time during this 35-minute visit was spent on counseling and coordination of care and discussion only care team and  answering questions.  Discussed with Dr. 11/26/20, MD Medical Director [facility] Pager: [phone] 09/25/2020 3:40 PM  To contact Stroke Continuity provider, please refer to [facility]. After hours, contact General Neurology

## 2020-09-25 NOTE — Progress Notes (Signed)
  Echocardiogram 2D Echocardiogram has been performed.  Amber [name] [name] 09/25/2020, 10:02 AM

## 2020-09-25 NOTE — ED Notes (Signed)
Output of 750cc of urine

## 2020-09-25 NOTE — Evaluation (Signed)
Physical Therapy Evaluation Patient Details Name: Amber [name] MRN: [id number] DOB: 07-Aug-1970 Today's Date: 09/25/2020   History of Present Illness  Pt is 50 yo female who presented on 09/24/20 with R sided weakness that began on 09/22/20 with multiple falls and episode of laying in floor for 20 hrs and then 8 hrs.  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   Pt with hx of HTN and depression.  Clinical Impression  Pt admitted with above diagnosis. At baseline, pt independent and works as a Runner, broadcasting/film/video. She lives with her mother and also has a fiancee for support. She began having R sided weakness on 09/22/20 with multiple falls and episodes of being unable to get off floor for hours. Today, she presents with R sided weakness (UE worse than LE) and R facial droop.  Additionally, pt with decreased balance.  Pt is motivated and has good rehab potential.  Strongly recommend CIR at d/c.  Pt currently with functional limitations due to the deficits listed below (see PT Problem List). Pt will benefit from skilled PT to increase their independence and safety with mobility to allow discharge to the venue listed below.       Follow Up Recommendations CIR    Equipment Recommendations  Other (comment);Wheelchair cushion (measurements PT);Wheelchair (measurements PT);3in1 (PT) (further assessment post acute (hemi walker vs walker with platform or hand trough))    Recommendations for Other Services Rehab consult     Precautions / Restrictions Precautions Precautions: Fall      Mobility  Bed Mobility Overal bed mobility: Needs Assistance Bed Mobility: Supine to Sit;Sit to Supine     Supine to sit: Min assist;+2 for safety/equipment Sit to supine: Min assist   General bed mobility comments: +2 safety as pt on elevated ED stretcher    Transfers Overall transfer level: Needs assistance Equipment used: 2 person hand held assist Transfers: Sit to/from  Stand Sit to Stand: Min assist;+2 physical assistance         General transfer comment: Min A of 2 for safety and to stabilize R side; cues for safety  Ambulation/Gait Ambulation/Gait assistance: Min assist;+2 physical assistance Gait Distance (Feet): 3 Feet Assistive device: 2 person hand held assist Gait Pattern/deviations: Step-to pattern;Decreased stride length;Decreased weight shift to right Gait velocity: decreased   General Gait Details: Seen in ED so with limited space.  Initially, worked on weight shifting with R knee blocked and able to progress to a few side steps toward [facility] with cues for sequencing and R knee blocked  Stairs            Wheelchair Mobility    Modified Rankin (Stroke Patients Only) Modified Rankin (Stroke Patients Only) Pre-Morbid Rankin Score: No symptoms Modified Rankin: Moderately severe disability     Balance Overall balance assessment: Needs assistance Sitting-balance support: No upper extremity supported Sitting balance-Leahy Scale: Good Sitting balance - Comments: Able to sit EOB and minimally shift weight; did not significantly challenge as pt on ED stretcher that was tall   Standing balance support: Bilateral upper extremity supported Standing balance-Leahy Scale: Poor Standing balance comment: Requiring UE support and min A from therapist to stabilize                             Pertinent Vitals/Pain Pain Assessment: 0-10 Pain Score: 2  Pain Location: legs sore from falls; elbows sore from rug burn Pain Descriptors /  Indicators: Discomfort;Sore Pain Intervention(s): Limited activity within patient's tolerance;Monitored during session;Repositioned    Home Living Family/patient expects to be discharged to:: Inpatient rehab Living Arrangements:  (Pt has apartment in mother's basement; mother lives upstairs and is independent) Available Help at Discharge: Family;Available PRN/intermittently Type of Home: House Home  Access: Stairs to enter;Level entry (level entry to first floor where mother lives; down 14 stairs to pt's living area) Entrance Stairs-Rails: Right;Left;Can reach both Entrance Stairs-Number of Steps: 14 Home Layout: Multi-level;Able to live on main level with bedroom/bathroom (pt lives in basement but could stay on first floor with mother if needed) Home Equipment: None      Prior Function Level of Independence: Independent         Comments: Pt works full time as Engineer, agricultural        Extremity/Trunk Assessment   Upper Extremity Assessment Upper Extremity Assessment: Defer to OT evaluation    Lower Extremity Assessment Lower Extremity Assessment: LLE deficits/detail;RLE deficits/detail RLE Deficits / Details: ROM WFL but did note only able to get to neutral dorsiflexion; MMT: ankle 4-/5, knee 4-/5, hip 4/5 RLE Sensation: WNL LLE Deficits / Details: ROM WFL; MMT 5/5 LLE Sensation: WNL    Cervical / Trunk Assessment Cervical / Trunk Assessment: Normal  Communication   Communication: No difficulties  Cognition Arousal/Alertness: Awake/alert Behavior During Therapy: WFL for tasks assessed/performed Overall Cognitive Status: Within Functional Limits for tasks assessed                                 General Comments: Pt recalling therapist names, provided detailed history, followed all commands.  Some decreased insight into medical condition/awareness - she had R sided weakness with multiple falls and inability to walk but waited a few days to come to hospital      General Comments General comments (skin integrity, edema, etc.): Vision testing: Some limitations at baseline due to needing cartaract on R eye and congential strabismus.  Pt reports no changes in vision. Educated on safe transfers and movements, stroke rehab, recommendations, and HEP at bed level to begin as able.    Exercises     Assessment/Plan    PT Assessment Patient  needs continued PT services  PT Problem List Decreased strength;Decreased mobility;Decreased safety awareness;Impaired tone;Decreased range of motion;Decreased activity tolerance;Decreased balance;Decreased knowledge of use of DME       PT Treatment Interventions Therapeutic activities;DME instruction;Gait training;Therapeutic exercise;Patient/family education;Balance training;Functional mobility training;Neuromuscular re-education    PT Goals (Current goals can be found in the Care Plan section)  Acute Rehab PT Goals Patient Stated Goal: regain strength; walk; return to teaching PT Goal Formulation: With patient Time For Goal Achievement: 10/09/20 Potential to Achieve Goals: Good Additional Goals Additional Goal #1: Will increase R leg strength to 5/5 for gait and stairs    Frequency Min 4X/week   Barriers to discharge        Co-evaluation PT/OT/SLP Co-Evaluation/Treatment: Yes Reason for Co-Treatment: Complexity of the patient's impairments (multi-system involvement);For patient/therapist safety PT goals addressed during session: Mobility/safety with mobility;Balance OT goals addressed during session: ADL's and self-care;Strengthening/ROM       AM-PAC PT "6 Clicks" Mobility  Outcome Measure Help needed turning from your back to your side while in a flat bed without using bedrails?: A Little Help needed moving from lying on your back to sitting on the side of a flat bed without using bedrails?:  A Lot Help needed moving to and from a bed to a chair (including a wheelchair)?: A Lot Help needed standing up from a chair using your arms (e.g., wheelchair or bedside chair)?: A Lot Help needed to walk in hospital room?: A Lot Help needed climbing 3-5 steps with a railing? : A Lot 6 Click Score: 13    End of Session Equipment Utilized During Treatment: Gait belt Activity Tolerance: Patient tolerated treatment well Patient left: in bed;with call bell/phone within reach (in ED on  stretcher) Nurse Communication: Mobility status PT Visit Diagnosis: Unsteadiness on feet (R26.81);Muscle weakness (generalized) (M62.81);Hemiplegia and hemiparesis Hemiplegia - Right/Left: Right Hemiplegia - dominant/non-dominant: Dominant Hemiplegia - caused by: Cerebral infarction    Time: 1950-9326 PT Time Calculation (min) (ACUTE ONLY): 38 min   Charges:   PT Evaluation $PT Eval Moderate Complexity: 1 Mod PT Treatments $Therapeutic Activity: 23-37 mins        Anise Salvo, PT Acute Rehab Services Pager [phone] [facility] [phone]   Rayetta Humphrey 09/25/2020, 5:08 PM

## 2020-09-25 NOTE — ED Notes (Signed)
Attempted to call report x 1  

## 2020-09-26 DIAGNOSIS — I6302 Cerebral infarction due to thrombosis of basilar artery: Secondary | ICD-10-CM

## 2020-09-26 DIAGNOSIS — E7800 Pure hypercholesterolemia, unspecified: Secondary | ICD-10-CM

## 2020-09-26 DIAGNOSIS — E1159 Type 2 diabetes mellitus with other circulatory complications: Secondary | ICD-10-CM

## 2020-09-26 LAB — GLUCOSE, CAPILLARY
Glucose-Capillary: 320 mg/dL — ABNORMAL HIGH (ref 70–99)
Glucose-Capillary: 367 mg/dL — ABNORMAL HIGH (ref 70–99)
Glucose-Capillary: 277 mg/dL — ABNORMAL HIGH (ref 70–99)
Glucose-Capillary: 243 mg/dL — ABNORMAL HIGH (ref 70–99)

## 2020-09-26 MED ORDER — HYDROCHLOROTHIAZIDE 12.5 MG PO CAPS
12.5000 mg | ORAL_CAPSULE | Freq: Every day | ORAL | Status: DC
  Administered 2020-09-27 – 2020-10-01 (×5): 12.5 mg via ORAL
  Filled 2020-09-26 (×5): qty 1

## 2020-09-26 MED ORDER — GLIPIZIDE 5 MG PO TABS
5.0000 mg | ORAL_TABLET | Freq: Every day | ORAL | Status: DC
  Administered 2020-09-27 – 2020-10-01 (×5): 5 mg via ORAL
  Filled 2020-09-26 (×6): qty 1

## 2020-09-26 MED ORDER — CYCLOBENZAPRINE HCL 10 MG PO TABS
5.0000 mg | ORAL_TABLET | Freq: Once | ORAL | Status: AC
  Administered 2020-09-26: 5 mg via ORAL
  Filled 2020-09-26: qty 1

## 2020-09-26 MED ORDER — ATORVASTATIN CALCIUM 40 MG PO TABS
40.0000 mg | ORAL_TABLET | Freq: Every day | ORAL | Status: DC
  Administered 2020-09-26 – 2020-09-30 (×5): 40 mg via ORAL
  Filled 2020-09-26 (×5): qty 1

## 2020-09-26 MED ORDER — INSULIN GLARGINE 100 UNIT/ML ~~LOC~~ SOLN
20.0000 [IU] | Freq: Every day | SUBCUTANEOUS | Status: DC
  Administered 2020-09-26 – 2020-09-27 (×2): 20 [IU] via SUBCUTANEOUS
  Filled 2020-09-26 (×3): qty 0.2

## 2020-09-26 MED ORDER — INSULIN ASPART 100 UNIT/ML IJ SOLN
5.0000 [IU] | Freq: Three times a day (TID) | INTRAMUSCULAR | Status: DC
  Administered 2020-09-26 – 2020-09-28 (×8): 5 [IU] via SUBCUTANEOUS

## 2020-09-26 NOTE — Progress Notes (Signed)
[facility] PROGRESS NOTE    Esther Bradstreet  WCB:[id number] DOB: 1971/01/30 DOA: 09/24/2020 PCP: Pcp, No      Brief Narrative:  Amber [name] is a 50 y.o. F with HTN, DM, who presented with right-sided weakness.  2 days prior to admission, the patient had sudden onset right hemiparesis and right facial droop.  She did not want to inconvenience anyone to drive her to the ER, so she waited until 7/6 to present.  In the ER, CT head suggested new stroke.  Neurology were consulted.         Assessment & Plan:  Acute stroke MRI shows left pons infarct. -Non-invasive angiography showed intracranial atherosclerosis not adjacent to suspect lesion -Echocardiogram shows no cardiac source -Carotid imaging without significant stenosis   -Needs sleep study after discharge -Lipids ordered: started on atorvastatin -Aspirin ordered at admission --> continue aspirin and Plavix three months then aspirin alone -Atrial fibrillation: None on tele -tPA not given because outside window -Dysphagia screen ordered in ER -PT eval orderered -Smoking cessation: not pertinent, nonsmoker      Diabetes Glucoses still elevated A1c 11% Previously was on Aspart 40 units BID and metformin.  Also tried glipizide plus semaglutide patient believes.    - Continue Lantus, increase dose - Continue SS corrections, increase scale - Resume home glipizide - Resume home semaglutide at discharge   Anxiety -Continue sertraline  Hypertension BP slightly elevated - Resume home HCTZ tomorrow -Resume Lisinopril on d/c   Asymptomatic bacteriuria No treatement necessary        Disposition: Status is: Inpatient  Remains inpatient appropriate because: she has dense right hemiparesis and will require residential rehabilitation either inpatient rehab or SNF  Dispo: The patient is from: Home              Anticipated d/c is to:  TBD              Patient currently is not medically stable to  d/c.   Difficult to place patient No           Level of care: Med-Surg       MDM: The below labs and imaging reports were reviewed and summarized above.  Medication management as above.   DVT prophylaxis: SCDs Start: 09/24/20 1825  Code Status: FULL Family Communication:              Subjective: Her right-sided weakness is still present.  No other fever, confusion, loss of consciousness, dyspnea, chest pain.  Objective: Vitals:   09/26/20 0500 09/26/20 0759 09/26/20 1134 09/26/20 1619  BP:  (!) 144/83 (!) 147/89 139/76  Pulse:  71 84 87  Resp:  17 18 17   Temp:  97.7 F (36.5 C) 97.9 F (36.6 C) 98.7 F (37.1 C)  TempSrc:  Oral Oral Oral  SpO2:  97% 98% 96%  Weight: 123.5 kg     Height: 5\' 10"  (1.778 m)       Intake/Output Summary (Last 24 hours) at 09/26/2020 1708 Last data filed at 09/26/2020 0600 Gross per 24 hour  Intake --  Output 600 ml  Net -600 ml   Filed Weights   09/26/20 0500  Weight: 123.5 kg    Examination: General appearance:  adult female, alert and in no acute distress.   HEENT: Anicteric, conjunctiva pink, lids and lashes normal. No nasal deformity, discharge, epistaxis.  Lips moist.   Skin: Warm and dry.  no jaundice.  No suspicious rashes or lesions. Cardiac: RRR, nl  S1-S2, no murmurs appreciated.  Capillary refill is brisk.  JVPnot visible.  No LE edema.  Radial  pulses 2+ and symmetric. Respiratory: Normal respiratory rate and rhythm.  CTAB without rales or wheezes. Abdomen: Abdomen soft.  no TTP. No ascites, distension, hepatosplenomegaly.   MSK: No deformities or effusions. Neuro: Awake and alert.  EOMI, she has right-sided weakness in the right arm and leg, barely able to lift the right arm.  She has right-sided facial droop.Marland Kitchen Speech fluent.    Psych: Sensorium intact and responding to questions, attention normal. Affect normal.  Judgment and insight appear normal.    Data Reviewed: I have personally reviewed following  labs and imaging studies:  CBC: Recent Labs  Lab 09/24/20 1628 09/24/20 1634 09/25/20 0347  WBC 10.7*  --  8.8  NEUTROABS 7.2  --   --   HGB 15.2* 16.0* 13.5  HCT 46.2* 47.0* 42.9  MCV 78.6*  --  81.1  PLT 247  --  239   Basic Metabolic Panel: Recent Labs  Lab 09/24/20 1628 09/24/20 1634 09/25/20 0347  NA 133* 135 135  K 4.0 4.1 3.8  CL 99 101 105  CO2 22  --  22  GLUCOSE 310* 314* 351*  BUN 13 13 9   CREATININE 0.59 0.40* 0.54  CALCIUM 9.1  --  8.6*  MG  --   --  1.8   GFR: Estimated Creatinine Clearance: 117.5 mL/min (by C-G formula based on SCr of 0.54 mg/dL). Liver Function Tests: Recent Labs  Lab 09/24/20 1628 09/25/20 0347  AST 14* 12*  ALT 19 17  ALKPHOS 66 64  BILITOT 1.5* 1.1  PROT 6.5 6.0*  ALBUMIN 3.7 3.3*   No results for input(s): LIPASE, AMYLASE in the last 168 hours. No results for input(s): AMMONIA in the last 168 hours. Coagulation Profile: Recent Labs  Lab 09/24/20 1628  INR 1.0   Cardiac Enzymes: Recent Labs  Lab 09/24/20 1628  CKTOTAL 112   BNP (last 3 results) No results for input(s): PROBNP in the last 8760 hours. HbA1C: Recent Labs    09/25/20 0347  HGBA1C 11.7*   CBG: Recent Labs  Lab 09/25/20 1641 09/25/20 2148 09/26/20 0611 09/26/20 1135 09/26/20 1617  GLUCAP 313* 291* 320* 367* 277*   Lipid Profile: Recent Labs    09/25/20 0348  CHOL 212*  HDL 30*  LDLCALC 107*  TRIG 377*  CHOLHDL 7.1   Thyroid Function Tests: No results for input(s): TSH, T4TOTAL, FREET4, T3FREE, THYROIDAB in the last 72 hours. Anemia Panel: No results for input(s): VITAMINB12, FOLATE, FERRITIN, TIBC, IRON, RETICCTPCT in the last 72 hours. Urine analysis:    Component Value Date/Time   COLORURINE YELLOW 09/24/2020 1919   APPEARANCEUR HAZY (A) 09/24/2020 1919   LABSPEC >1.046 (H) 09/24/2020 1919   PHURINE 5.0 09/24/2020 1919   GLUCOSEU >=500 (A) 09/24/2020 1919   HGBUR NEGATIVE 09/24/2020 1919   BILIRUBINUR NEGATIVE  09/24/2020 1919   KETONESUR 80 (A) 09/24/2020 1919   PROTEINUR NEGATIVE 09/24/2020 1919   NITRITE POSITIVE (A) 09/24/2020 1919   LEUKOCYTESUR TRACE (A) 09/24/2020 1919   Sepsis Labs: @LABRCNTIP (procalcitonin:4,lacticacidven:4)  ) Recent Results (from the past 240 hour(s))  Resp Panel by RT-PCR (Flu A&B, Covid) Nasopharyngeal Swab     Status: None   Collection Time: 09/24/20  4:28 PM   Specimen: Nasopharyngeal Swab; Nasopharyngeal(NP) swabs in vial transport medium  Result Value Ref Range Status   SARS Coronavirus 2 by RT PCR NEGATIVE NEGATIVE Final  Comment: (NOTE) SARS-CoV-2 target nucleic acids are NOT DETECTED.  The SARS-CoV-2 RNA is generally detectable in upper respiratory specimens during the acute phase of infection. The lowest concentration of SARS-CoV-2 viral copies this assay can detect is 138 copies/mL. A negative result does not preclude SARS-Cov-2 infection and should not be used as the sole basis for treatment or other patient management decisions. A negative result may occur with  improper specimen collection/handling, submission of specimen other than nasopharyngeal swab, presence of viral mutation(s) within the areas targeted by this assay, and inadequate number of viral copies(<138 copies/mL). A negative result must be combined with clinical observations, patient history, and epidemiological information. The expected result is Negative.  Fact Sheet for Patients:  BloggerCourse.com  Fact Sheet for Healthcare Providers:  SeriousBroker.it  This test is no t yet approved or cleared by the [place] FDA and  has been authorized for detection and/or diagnosis of SARS-CoV-2 by FDA under an Emergency Use Authorization (EUA). This EUA will remain  in effect (meaning this test can be used) for the duration of the COVID-19 declaration under Section 564(b)(1) of the Act, 21 U.S.C.section 360bbb-3(b)(1), unless the  authorization is terminated  or revoked sooner.       Influenza A by PCR NEGATIVE NEGATIVE Final   Influenza B by PCR NEGATIVE NEGATIVE Final    Comment: (NOTE) The Xpert Xpress SARS-CoV-2/FLU/RSV plus assay is intended as an aid in the diagnosis of influenza from Nasopharyngeal swab specimens and should not be used as a sole basis for treatment. Nasal washings and aspirates are unacceptable for Xpert Xpress SARS-CoV-2/FLU/RSV testing.  Fact Sheet for Patients: BloggerCourse.com  Fact Sheet for Healthcare Providers: SeriousBroker.it  This test is not yet approved or cleared by the [place] FDA and has been authorized for detection and/or diagnosis of SARS-CoV-2 by FDA under an Emergency Use Authorization (EUA). This EUA will remain in effect (meaning this test can be used) for the duration of the COVID-19 declaration under Section 564(b)(1) of the Act, 21 U.S.C. section 360bbb-3(b)(1), unless the authorization is terminated or revoked.  Performed at [facility], [address], [place], [place] [address]          Radiology Studies: MR BRAIN WO CONTRAST  Result Date: 09/24/2020 CLINICAL DATA:  Suspected stroke EXAM: MRI HEAD WITHOUT CONTRAST TECHNIQUE: Multiplanar, multiecho pulse sequences of the brain and surrounding structures were obtained without intravenous contrast. COMPARISON:  None. FINDINGS: Brain: There is a 10 mm acute infarct within the left pons. No acute or chronic hemorrhage. Normal white matter signal, parenchymal volume and CSF spaces. The midline structures are normal. Vascular: Major flow voids are preserved. Skull and upper cervical spine: Normal calvarium and skull base. Visualized upper cervical spine and soft tissues are normal. Sinuses/Orbits:No paranasal sinus fluid levels or advanced mucosal thickening. No mastoid or middle ear effusion. Normal orbits. IMPRESSION: 10 mm acute infarct of the  left pons. No hemorrhage or mass effect. Electronically Signed   By: Deatra Robinson M.D.   On: 09/24/2020 23:00   ECHOCARDIOGRAM COMPLETE BUBBLE STUDY  Result Date: 09/25/2020    ECHOCARDIOGRAM REPORT   Patient Name:   Amber [name] Date of Exam: 09/25/2020 Medical Rec #:  [id number]      Height:       67.0 in Accession #:    [id number]     Weight:       260.0 lb Date of Birth:  1970/09/02       BSA:  2.261 m Patient Age:    50 years       BP:           143/41 mmHg Patient Gender: F              HR:           84 bpm. Exam Location:  Inpatient Procedure: 2D Echo, Cardiac Doppler, Color Doppler and Saline Contrast Bubble            Study Indications:    Stroke I63.9  History:        Patient has no prior history of Echocardiogram examinations.                 Risk Factors:Hypertension, Diabetes and Dyslipidemia.  Sonographer:    Tiffany Dance Referring Phys: [id number] Angie Fava IMPRESSIONS  1. Left ventricular ejection fraction, by estimation, is 60 to 65%. The left ventricle has normal function. The left ventricle has no regional wall motion abnormalities. Left ventricular diastolic parameters are consistent with Grade II diastolic dysfunction (pseudonormalization).  2. Right ventricular systolic function is normal. The right ventricular size is normal. Tricuspid regurgitation signal is inadequate for assessing PA pressure.  3. Left atrial size was mildly dilated.  4. The mitral valve is normal in structure. No evidence of mitral valve regurgitation. No evidence of mitral stenosis.  5. The aortic valve is tricuspid. Aortic valve regurgitation is not visualized. No aortic stenosis is present.  6. The inferior vena cava is normal in size with greater than 50% respiratory variability, suggesting right atrial pressure of 3 mmHg.  7. Bubble study negative. FINDINGS  Left Ventricle: Left ventricular ejection fraction, by estimation, is 60 to 65%. The left ventricle has normal function. The left ventricle has no  regional wall motion abnormalities. The left ventricular internal cavity size was normal in size. There is  no left ventricular hypertrophy. Left ventricular diastolic parameters are consistent with Grade II diastolic dysfunction (pseudonormalization). Right Ventricle: The right ventricular size is normal. No increase in right ventricular wall thickness. Right ventricular systolic function is normal. Tricuspid regurgitation signal is inadequate for assessing PA pressure. Left Atrium: Left atrial size was mildly dilated. Right Atrium: Right atrial size was normal in size. Pericardium: There is no evidence of pericardial effusion. Mitral Valve: The mitral valve is normal in structure. No evidence of mitral valve regurgitation. No evidence of mitral valve stenosis. Tricuspid Valve: The tricuspid valve is normal in structure. Tricuspid valve regurgitation is trivial. Aortic Valve: The aortic valve is tricuspid. Aortic valve regurgitation is not visualized. No aortic stenosis is present. Pulmonic Valve: The pulmonic valve was normal in structure. Pulmonic valve regurgitation is not visualized. Aorta: The aortic root is normal in size and structure. Venous: The inferior vena cava is normal in size with greater than 50% respiratory variability, suggesting right atrial pressure of 3 mmHg. IAS/Shunts: Bubble study negative. Agitated saline contrast was given intravenously to evaluate for intracardiac shunting.  LEFT VENTRICLE PLAX 2D LVIDd:         4.80 cm  Diastology LVIDs:         3.80 cm  LV e' medial:    6.34 cm/s LV PW:         1.30 cm  LV E/e' medial:  14.2 LV IVS:        0.90 cm  LV e' lateral:   9.88 cm/s LVOT diam:     2.00 cm  LV E/e' lateral: 9.1 LV SV:  91 LV SV Index:   40 LVOT Area:     3.14 cm  RIGHT VENTRICLE             IVC RV Basal diam:  2.60 cm     IVC diam: 1.25 cm RV S prime:     14.50 cm/s TAPSE (M-mode): 2.5 cm LEFT ATRIUM             Index       RIGHT ATRIUM           Index LA diam:         3.90 cm 1.73 cm/m  RA Area:     11.00 cm LA Vol (A2C):   68.6 ml 30.35 ml/m RA Volume:   20.60 ml  9.11 ml/m LA Vol (A4C):   34.6 ml 15.31 ml/m LA Biplane Vol: 49.1 ml 21.72 ml/m  AORTIC VALVE LVOT Vmax:   126.00 cm/s LVOT Vmean:  93.800 cm/s LVOT VTI:    0.289 m  AORTA Ao Root diam: 3.00 cm Ao Asc diam:  2.80 cm MITRAL VALVE MV Area (PHT): 3.91 cm    SHUNTS MV Decel Time: 194 msec    Systemic VTI:  0.29 m MV E velocity: 90.30 cm/s  Systemic Diam: 2.00 cm MV A velocity: 64.60 cm/s MV E/A ratio:  1.40 Marca Anconaalton Mclean MD Electronically signed by Marca Anconaalton Mclean MD Signature Date/Time: 09/25/2020/3:58:15 PM    Final         Scheduled Meds:  aspirin EC  81 mg Oral Daily   atorvastatin  40 mg Oral QHS   clopidogrel  75 mg Oral Daily   insulin aspart  0-20 Units Subcutaneous TID WC   insulin aspart  0-5 Units Subcutaneous QHS   insulin aspart  5 Units Subcutaneous TID WC   insulin glargine  20 Units Subcutaneous Daily   sertraline  100 mg Oral Daily   Continuous Infusions:     LOS: 1 day    Time spent: 25  minutes    Alberteen Samhristopher P Zeffie Bickert, MD [facility] 09/26/2020, 5:08 PM     Please page though AMION or Epic secure chat:  For [employer] Holdings Corporation[employer]ion password, Higher education careers advisercontact charge nurse

## 2020-09-26 NOTE — Progress Notes (Addendum)
Patient explains having muscle spasm and cramping in her R. Leg that flexes her leg up toward her body. She explains that has been occurring every night since stroke.   She would like some relief from this, have paged the MD for possible new orders.   Addenum: patient was relieved of muscle spasms with a 5mg  dose of Flexeril.

## 2020-09-26 NOTE — Progress Notes (Addendum)
Occupational Therapy Treatment Patient Details Name: Amber [name] MRN: [id number] DOB: 08-07-1970 Today's Date: 09/26/2020    History of present illness 50 yo female who presented on 09/24/20 with R sided weakness that began on 09/22/20 with multiple falls and episode of laying in floor for 20 hrs and then 8 hrs.  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   Pt with hx of HTN and depression.   OT comments  Pt very motivated to participate in therapy and return to PLOF. Providing handout and education on AROM, FM coordination, and theraputty exercises. Pt participating in AROM of RUE and theraputty exercises. Pt continues to present with demonstrating decreased pinch/grasp strength, coordination, motor planning, and proprioception. Providing cues throughout for normalizing movement patterns. Continue to highly recommend dc to CIR for intensive OT and will continue to follow acutely as admitted.    Follow Up Recommendations  CIR    Equipment Recommendations  None recommended by OT    Recommendations for Other Services Rehab consult    Precautions / Restrictions Precautions Precautions: Fall Restrictions Weight Bearing Restrictions: No       Mobility Bed Mobility Overal bed mobility: Needs Assistance Bed Mobility: Supine to Sit;Sit to Supine     Supine to sit: Min assist Sit to supine: Min guard   General bed mobility comments: Min A to hold therapist's hand and pull into upright posture. Min guard A for safety    Transfers                      Balance Overall balance assessment: Needs assistance Sitting-balance support: No upper extremity supported Sitting balance-Leahy Scale: Good                                     ADL either performed or assessed with clinical judgement   ADL                                         General ADL Comments: Focused session on education for exercises  including FM skills, AROM of RUE, and theraputty.     Vision   Vision Assessment?: Yes Eye Alignment: Impaired (comment) Ocular Range of Motion: Within Functional Limits Alignment/Gaze Preference: Within Defined Limits Tracking/Visual Pursuits: Able to track stimulus in all quads without difficulty Visual Fields: No apparent deficits Additional Comments: congenital strabissmus   Perception     Praxis      Cognition Arousal/Alertness: Awake/alert Behavior During Therapy: WFL for tasks assessed/performed Overall Cognitive Status: Within Functional Limits for tasks assessed                                 General Comments: Very motivated and eager to participate in therapy. Applying due and education well.        Exercises Exercises: General Upper Extremity;Other exercises;Hand exercises General Exercises - Upper Extremity Elbow Flexion: AROM;Right;5 reps;Seated Elbow Extension: AROM;Right;5 reps;Seated Wrist Flexion: AROM;Right;5 reps;Seated Wrist Extension: AROM;Right;5 reps;Seated Digit Composite Flexion: AROM;Right;5 reps;Seated Composite Extension: AROM;Right;5 reps;Seated Hand Exercises Forearm Supination: AROM;Right;10 reps;Seated (visual cues) Forearm Pronation: AROM;Right;10 reps;Seated (visual cues) Digit Lifts: AROM;Right;5 reps;Seated Opposition: AROM;Right;5 reps;Seated Other Exercises Other Exercises: Theraputty exercises reviewed. Rolling putty into ball with  BUE, flattening putty, rolling into worm, and thne pinching. Pt requiring verbal cues for normalizing movement patterns and reducing compensatory hiking of shoulder. Also providing hand over hand for pinching to isolate thumb and index finger. Use of super soft tan putty   Shoulder Instructions       General Comments      Pertinent Vitals/ Pain       Pain Assessment: Faces Faces Pain Scale: Hurts a little bit Pain Location: RUE Pain Descriptors / Indicators: Discomfort;Sore Pain  Intervention(s): Monitored during session;Limited activity within patient's tolerance;Repositioned  Home Living                                          Prior Functioning/Environment              Frequency  Min 2X/week        Progress Toward Goals  OT Goals(current goals can now be found in the care plan section)  Progress towards OT goals: Progressing toward goals  Acute Rehab OT Goals Patient Stated Goal: to use my hand and walk OT Goal Formulation: With patient Time For Goal Achievement: 10/09/20 Potential to Achieve Goals: Good ADL Goals Pt Will Perform Grooming: with min guard assist;standing Pt Will Perform Upper Body Bathing: with set-up;sitting Pt Will Perform Lower Body Bathing: with min guard assist;sit to/from stand Pt Will Perform Upper Body Dressing: with set-up;sitting Pt Will Perform Lower Body Dressing: with min guard assist;sit to/from stand Pt Will Transfer to Toilet: with min guard assist;ambulating;regular height toilet;bedside commode;grab bars Pt Will Perform Toileting - Clothing Manipulation and hygiene: with min guard assist;sit to/from stand Pt/caregiver will Perform Home Exercise Program: Increased ROM;Right Upper extremity;With Supervision;With written HEP provided Additional ADL Goal #1: Pt will use Rt UE as an active assist during ADLs consistently  Plan      Co-evaluation                 AM-PAC OT "6 Clicks" Daily Activity     Outcome Measure   Help from another person eating meals?: A Little Help from another person taking care of personal grooming?: A Little Help from another person toileting, which includes using toliet, bedpan, or urinal?: A Lot Help from another person bathing (including washing, rinsing, drying)?: A Lot Help from another person to put on and taking off regular upper body clothing?: A Little Help from another person to put on and taking off regular lower body clothing?: A Lot 6 Click  Score: 15    End of Session Equipment Utilized During Treatment: Gait belt  OT Visit Diagnosis: Unsteadiness on feet (R26.81);Hemiplegia and hemiparesis;Muscle weakness (generalized) (M62.81);History of falling (Z91.81) Hemiplegia - Right/Left: Right Hemiplegia - dominant/non-dominant: Dominant Hemiplegia - caused by: Cerebral infarction   Activity Tolerance Patient tolerated treatment well   Patient Left in bed;with call bell/phone within reach   Nurse Communication Mobility status        Time: 1528-1600 OT Time Calculation (min): 32 min  Charges: OT General Charges $OT Visit: 1 Visit OT Treatments $Therapeutic Exercise: 23-37 mins  Jazzmyne Rasnick MSOT, OTR/L Acute Rehab Pager: [phone] Office: [phone]   Theodoro Grist Latha [place] 09/26/2020, 4:40 PM

## 2020-09-26 NOTE — Progress Notes (Signed)
Inpatient Rehab Admissions Coordinator:   I met with Pt. To discuss potential CIR admit. Pt is interested and states that she has 24/7 supervision level support from her mother and intermittent min assist from her fiance. [employer] does not allow me to open cases Friday-Sunday, so I will open a case with her insurance on Monday.   Clemens Catholic, [place], [place] Admissions Coordinator  [phone] ([place]) [phone] (office)

## 2020-09-26 NOTE — Plan of Care (Signed)
  Problem: Education: Goal: Knowledge of General Education information will improve Description: Including pain rating scale, medication(s)/side effects and non-pharmacologic comfort measures Outcome: Progressing   Problem: Health Behavior/Discharge Planning: Goal: Ability to manage health-related needs will improve Outcome: Progressing   Problem: Clinical Measurements: Goal: Ability to maintain clinical measurements within normal limits will improve Outcome: Progressing Goal: Will remain free from infection Outcome: Progressing Goal: Diagnostic test results will improve Outcome: Progressing Goal: Respiratory complications will improve Outcome: Progressing Goal: Cardiovascular complication will be avoided Outcome: Progressing   Problem: Activity: Goal: Risk for activity intolerance will decrease Outcome: Progressing   Problem: Nutrition: Goal: Adequate nutrition will be maintained Outcome: Progressing   Problem: Coping: Goal: Level of anxiety will decrease Outcome: Progressing   Problem: Elimination: Goal: Will not experience complications related to bowel motility Outcome: Progressing Goal: Will not experience complications related to urinary retention Outcome: Progressing   Problem: Pain Managment: Goal: General experience of comfort will improve Outcome: Progressing   Problem: Safety: Goal: Ability to remain free from injury will improve Outcome: Progressing   Problem: Skin Integrity: Goal: Risk for impaired skin integrity will decrease Outcome: Progressing   Problem: Education: Goal: Knowledge of disease or condition will improve Outcome: Progressing Goal: Knowledge of secondary prevention will improve Outcome: Progressing Goal: Individualized Educational Video(s) Outcome: Progressing   

## 2020-09-26 NOTE — Progress Notes (Signed)
STROKE TEAM PROGRESS NOTE   INTERVAL HISTORY No family is at the bedside. Pt sitting in bed for lunch. She still has right hemiparesis and right facial droop, PT/OT recommend CIR. She was educated on risk factor modification especially DM control.   Vitals:   09/26/20 0317 09/26/20 0500 09/26/20 0759 09/26/20 1134  BP: (!) 145/74  (!) 144/83 (!) 147/89  Pulse: 69  71 84  Resp: 17  17 18   Temp: 97.7 F (36.5 C)  97.7 F (36.5 C) 97.9 F (36.6 C)  TempSrc:   Oral Oral  SpO2: 97%  97% 98%  Weight:  123.5 kg    Height:  5\' 10"  (1.778 m)     CBC:  Recent Labs  Lab 09/24/20 1628 09/24/20 1634 09/25/20 0347  WBC 10.7*  --  8.8  NEUTROABS 7.2  --   --   HGB 15.2* 16.0* 13.5  HCT 46.2* 47.0* 42.9  MCV 78.6*  --  81.1  PLT 247  --  239   Basic Metabolic Panel:  Recent Labs  Lab 09/24/20 1628 09/24/20 1634 09/25/20 0347  NA 133* 135 135  K 4.0 4.1 3.8  CL 99 101 105  CO2 22  --  22  GLUCOSE 310* 314* 351*  BUN 13 13 9   CREATININE 0.59 0.40* 0.54  CALCIUM 9.1  --  8.6*  MG  --   --  1.8   Lipid Panel:  Recent Labs  Lab 09/25/20 0348  CHOL 212*  TRIG 377*  HDL 30*  CHOLHDL 7.1  VLDL 75*  LDLCALC 107*   HgbA1c:  Recent Labs  Lab 09/25/20 0347  HGBA1C 11.7*   Urine Drug Screen: No results for input(s): LABOPIA, COCAINSCRNUR, LABBENZ, AMPHETMU, THCU, LABBARB in the last 168 hours.  Alcohol Level No results for input(s): ETH in the last 168 hours.  IMAGING past 24 hours MR BRAIN WO CONTRAST  Result Date: 09/24/2020 IMPRESSION: 10 mm acute infarct of the left pons. No hemorrhage or mass effect. Electronically Signed   By: 11/26/20 M.D.   On: 09/24/2020 23:00   CT HEAD CODE STROKE WO CONTRAST  Result Date: 09/24/2020 MPRESSION: Abnormal hypoattenuation within the left pons and left thalamus, concerning for age indeterminate infarcts that could be acute/subacute.   CT ANGIO HEAD NECK W WO CM (CODE STROKE)  Result Date: 09/24/2020 IMPRESSION: 1. No emergent  large vessel occlusion. 2. 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 3. Cervical carotid atherosclerosis without stenosis.   PHYSICAL EXAM Obese middle-age  . Afebrile. Head is nontraumatic. Neck is supple without bruit.    Cardiac exam no murmur or gallop. Lungs are clear to auscultation. Distal pulses are well felt. lady not in distress.  Neurological Exam ;  Awake  Alert oriented x 3. 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 Fundi were not visualized. Vision acuity and fields appear normal. Hearing is normal. Palatal movements are normal. Right facial droop. Tongue midline. Normal strength on the left UE and LE, however, RUE 3/5 proximal and distally, RLE 4/5 proximal and 4+/5 distally. Impaired right finger-to-nose and knee to heel coordination not out of proportion to weakness. Normal sensation. Gait deferred.   ASSESSMENT/PLAN Amber [name] is a 50 y.o. female with history of hypertension, DM and depression presenting with right sided weakness.  CT head revealed a left pontine infarct.    Stroke:  acute left pontine stroke likely from symptomatic moderate distal basilar stenosis. Code Stroke CT head: abnormal hypoattenuation within the left pons and left thalamus CTA head & neck: intracranial atherosclerosis including severe proximal right P2 and moderate basilar artery stenoses. Cervical carotid atherosclerosis MRI brain: 10 mm acute infarct of the left pon, old left thalamic lacune 2D Echo EF 60-65% LDL 107 HgbA1c 11.7 VTE prophylaxis - SCDs No antithrombotic prior to admission, now on aspirin 81 mg daily and clopidogrel 75 mg daily DAPT for 3 months then aspirin alone Therapy recommendations:  CIR Disposition:  pending  Hypertension Home meds:  lisinopril-hydrochlorothiazide 20-12.5mg  bid Stable Long-term BP goal  normotensive  Hyperlipidemia Home meds:  none  LDL 107, goal < 70 Add atorvastatin 40mg  daily  Continue statin at discharge  Diabetes type II Uncontrolled Home meds:  none HgbA1c 11.7, goal < 7.0 hyperglycemia CBGs SSI Close follow up with PCP for better DM control  Other Stroke Risk Factors Obesity, Body mass index is 39.07 kg/m., BMI >/= 30 associated with increased stroke risk, recommend weight loss, diet and exercise as appropriate  Family hx stroke (mother)  Other Active Problems Anxiety/depression: Zoloft  Hospital day # 1  Neurology will sign off. Please call with questions. Pt will follow up with stroke clinic NP at [facility] in about 4 weeks. Thanks for the consult.  [facility], MD PhD Stroke Neurology 09/26/2020 12:15 PM  To contact Stroke Continuity provider, please refer to 11/27/2020. After hours, contact General Neurology

## 2020-09-26 NOTE — Progress Notes (Signed)
Physical Therapy Treatment Patient Details Name: Amber [name] MRN: [id number] DOB: [birth year] Today's Date: 09/26/2020    History of Present Illness 50 yo female who presented on 09/24/20 with R sided weakness that began on 09/22/20 with multiple falls and episode of laying in floor for 20 hrs and then 8 hrs.  She was found to have acute L pontine (73mm L pons) CVA with moderate basilar stenosis.   Pt with hx of HTN and depression.    PT Comments    Patient very motivated and eager to participate with therapy. Patient receptive to all education and cueing throughout. Patient required min-modA for 20' ambulation and cues for attending to R hand on RW due to impaired proprioception and coordination. Performed sit to stand x 10 with cues for maintaining midline and equal weight distribution. Continue to recommend comprehensive inpatient rehab (CIR) for post-acute therapy needs.     Follow Up Recommendations  CIR     Equipment Recommendations  Wheelchair cushion (measurements PT);Wheelchair (measurements PT);3in1 (PT);Rolling Iniya Matzek with 5" wheels    Recommendations for Other Services       Precautions / Restrictions Precautions Precautions: Fall Restrictions Weight Bearing Restrictions: No    Mobility  Bed Mobility Overal bed mobility: Needs Assistance Bed Mobility: Supine to Sit;Sit to Supine     Supine to sit: Min assist Sit to supine: Min guard   General bed mobility comments: Min A to hold therapist's hand and pull into upright posture. Min guard A for safety    Transfers Overall transfer level: Needs assistance Equipment used: Rolling Kareem Cathey (2 wheeled) Transfers: Sit to/from Stand Sit to Stand: Min assist         General transfer comment: minA for rise and steady initially and when cued to stand maintaining midline and to limit favoring L side due to R weakness. Repeated sit to stands x 10  Ambulation/Gait Ambulation/Gait assistance: Mod assist;Min assist Gait  Distance (Feet): 20 Feet Assistive device: Rolling Makalya Nave (2 wheeled) Gait Pattern/deviations: Decreased stride length;Decreased weight shift to right;Step-through pattern Gait velocity: decreased   General Gait Details: Demos mild decreased safety awareness with increasing gait speed and not attending to R UE on RW. Min-modA during mobility for balance, RW management, and R hand support   Stairs             Wheelchair Mobility    Modified Rankin (Stroke Patients Only) Modified Rankin (Stroke Patients Only) Pre-Morbid Rankin Score: No symptoms Modified Rankin: Moderately severe disability     Balance Overall balance assessment: Needs assistance Sitting-balance support: No upper extremity supported Sitting balance-Leahy Scale: Good     Standing balance support: Bilateral upper extremity supported Standing balance-Leahy Scale: Poor Standing balance comment: Requiring UE support and min A from therapist to stabilize                            Cognition Arousal/Alertness: Awake/alert Behavior During Therapy: WFL for tasks assessed/performed Overall Cognitive Status: Within Functional Limits for tasks assessed                                 General Comments: Very motivated and eager to participate in therapy. Applying cues and education well.      Exercises General Exercises - Upper Extremity Elbow Flexion: AROM;Right;5 reps;Seated Elbow Extension: AROM;Right;5 reps;Seated Wrist Flexion: AROM;Right;5 reps;Seated Wrist Extension: AROM;Right;5 reps;Seated Digit Composite Flexion: AROM;Right;5 reps;Seated Composite  Extension: AROM;Right;5 reps;Seated Hand Exercises Forearm Supination: AROM;Right;10 reps;Seated (visual cues) Forearm Pronation: AROM;Right;10 reps;Seated (visual cues) Digit Lifts: AROM;Right;5 reps;Seated Opposition: AROM;Right;5 reps;Seated Other Exercises Other Exercises: Sit to stands x 10 with minA    General Comments         Pertinent Vitals/Pain Pain Assessment: Faces Faces Pain Scale: Hurts a little bit Pain Location: RUE Pain Descriptors / Indicators: Discomfort;Sore Pain Intervention(s): Monitored during session;Repositioned    Home Living                      Prior Function            PT Goals (current goals can now be found in the care plan section) Acute Rehab PT Goals Patient Stated Goal: to use my hand and walk PT Goal Formulation: With patient Time For Goal Achievement: 10/09/20 Potential to Achieve Goals: Good Progress towards PT goals: Progressing toward goals    Frequency    Min 4X/week      PT Plan Current plan remains appropriate    Co-evaluation              AM-PAC PT "6 Clicks" Mobility   Outcome Measure  Help needed turning from your back to your side while in a flat bed without using bedrails?: A Little Help needed moving from lying on your back to sitting on the side of a flat bed without using bedrails?: A Little Help needed moving to and from a bed to a chair (including a wheelchair)?: A Little Help needed standing up from a chair using your arms (e.g., wheelchair or bedside chair)?: A Little Help needed to walk in hospital room?: A Lot Help needed climbing 3-5 steps with a railing? : A Lot 6 Click Score: 16    End of Session Equipment Utilized During Treatment: Gait belt Activity Tolerance: Patient tolerated treatment well Patient left: in bed;with call bell/phone within reach;with bed alarm set;with family/visitor present Nurse Communication: Mobility status PT Visit Diagnosis: Unsteadiness on feet (R26.81);Muscle weakness (generalized) (M62.81);Hemiplegia and hemiparesis Hemiplegia - Right/Left: Right Hemiplegia - dominant/non-dominant: Dominant Hemiplegia - caused by: Cerebral infarction     Time: 3220-2542 PT Time Calculation (min) (ACUTE ONLY): 41 min  Charges:  $Gait Training: 23-37 mins $Therapeutic Exercise: 8-22 mins                      Kaity Pitstick A. Dan Humphreys PT, DPT Acute Rehabilitation Services Pager [phone] Office [phone]    Viviann Spare 09/26/2020, 5:20 PM

## 2020-09-27 LAB — BASIC METABOLIC PANEL
Anion gap: 9 (ref 5–15)
BUN: 14 mg/dL (ref 6–20)
CO2: 25 mmol/L (ref 22–32)
Calcium: 9.4 mg/dL (ref 8.9–10.3)
Chloride: 102 mmol/L (ref 98–111)
Creatinine, Ser: 0.57 mg/dL (ref 0.44–1.00)
GFR, Estimated: >60 mL/min (ref >60)
Glucose, Bld: 200 mg/dL — ABNORMAL HIGH (ref 70–99)
Potassium: 4 mmol/L (ref 3.5–5.1)
Sodium: 136 mmol/L (ref 135–145)

## 2020-09-27 LAB — GLUCOSE, CAPILLARY
Glucose-Capillary: 258 mg/dL — ABNORMAL HIGH (ref 70–99)
Glucose-Capillary: 240 mg/dL — ABNORMAL HIGH (ref 70–99)
Glucose-Capillary: 197 mg/dL — ABNORMAL HIGH (ref 70–99)
Glucose-Capillary: 257 mg/dL — ABNORMAL HIGH (ref 70–99)

## 2020-09-27 LAB — MAGNESIUM: Magnesium: 1.6 mg/dL — ABNORMAL LOW (ref 1.7–2.4)

## 2020-09-27 MED ORDER — CYCLOBENZAPRINE HCL 10 MG PO TABS
5.0000 mg | ORAL_TABLET | Freq: Three times a day (TID) | ORAL | Status: DC | PRN
  Administered 2020-09-27 – 2020-09-30 (×3): 5 mg via ORAL
  Filled 2020-09-27 (×3): qty 1

## 2020-09-27 NOTE — Plan of Care (Signed)
  Problem: Education: Goal: Knowledge of General Education information will improve Description: Including pain rating scale, medication(s)/side effects and non-pharmacologic comfort measures Outcome: Progressing   Problem: Health Behavior/Discharge Planning: Goal: Ability to manage health-related needs will improve Outcome: Progressing   Problem: Clinical Measurements: Goal: Ability to maintain clinical measurements within normal limits will improve Outcome: Progressing Goal: Will remain free from infection Outcome: Progressing Goal: Diagnostic test results will improve Outcome: Progressing Goal: Respiratory complications will improve Outcome: Progressing Goal: Cardiovascular complication will be avoided Outcome: Progressing   Problem: Activity: Goal: Risk for activity intolerance will decrease Outcome: Progressing   Problem: Nutrition: Goal: Adequate nutrition will be maintained Outcome: Progressing   Problem: Coping: Goal: Level of anxiety will decrease Outcome: Progressing   Problem: Elimination: Goal: Will not experience complications related to bowel motility Outcome: Progressing Goal: Will not experience complications related to urinary retention Outcome: Progressing   Problem: Pain Managment: Goal: General experience of comfort will improve Outcome: Progressing   Problem: Safety: Goal: Ability to remain free from injury will improve Outcome: Progressing   Problem: Skin Integrity: Goal: Risk for impaired skin integrity will decrease Outcome: Progressing   Problem: Education: Goal: Knowledge of disease or condition will improve Outcome: Progressing Goal: Knowledge of secondary prevention will improve Outcome: Progressing Goal: Individualized Educational Video(s) Outcome: Progressing   

## 2020-09-27 NOTE — Progress Notes (Signed)
[facility] PROGRESS NOTE    Amber [name]  YTK:[id number] DOB: [birth year] DOA: 09/24/2020 PCP: Pcp, No      Brief Narrative:  Amber [name] is a 50 y.o. F with HTN, DM, who presented with right-sided weakness.  2 days prior to admission, the patient had sudden onset right hemiparesis and right facial droop.  She did not want to inconvenience anyone to drive her to the ER, so she waited until 7/6 to present.  In the ER, CT head suggested new stroke.  Neurology were consulted.         Assessment & Plan:  Acute stroke Admitted and MRI showed left pons infarct.  Had persistent marked right sided hemiparesis.  Noninvasive angiography showed likely cause of lesion with significant atherosclerosis and basilar artery feeding pons.  -Echocardiogram showed no cardiogenic source, carotid imaging without significant stenosis - Needs outpatient sleep study - Lipids ordered, started on atorvastatin - Aspirin ordered at admission, continue aspirin and Plavix for 3 months, then aspirin 81 mg alone indefinitely - No atrial fibrillation on telemetry, likely atheroembolic from basilar artery - tPA not given because outside the window - Dysphagia screen ordered in the ER - PT evaluation ordered, recommending CIR - Non-smoker      Diabetes Glucose elevated, but improving A1c 11% Previously was on aspart 40 units twice daily and metformin and had reasonable control.  She recalls prior to that at some point being on a GLP-1 agonist glipizide and having also good control. - Continue Lantus - Continue sliding scale corrections - Resume home glipizide - Resume semaglutide lower dose at discharge with close PCP follow-up    Anxiety - Continue sertraline  Hypertension Blood pressure normal - Continue HCTZ - Resume lisinopril on discharge  Asymptomatic bacteriuria No treatment necessary   Cramps in the feet - Check magnesium and potassium - Okay to take Flexeril as  needed     Disposition: Status is: Inpatient  Remains inpatient appropriate because: she has dense right hemiparesis and will require residential rehabilitation either inpatient rehab or SNF  Dispo: The patient is from: Home              Anticipated d/c is to:  TBD              Patient currently is not medically stable to d/c.   Difficult to place patient No           Level of care: Med-Surg       MDM: The below labs and imaging reports were reviewed and summarized above.  Medication management as above.   DVT prophylaxis: SCDs Start: 09/24/20 1825  Code Status: FULL Family Communication:              Subjective: No fever, confusion, dyspnea, chest pain, loss of consciousness.  Still has dense right-sided hemiparesis, able to lift arm above the bed, but quite discoordinated.  Still with right facial droop.  Has cramps in the right foot and calf.  Objective: Vitals:   09/27/20 0330 09/27/20 0729 09/27/20 1137 09/27/20 1515  BP: (!) 143/74 118/71 (!) 143/81 (!) 151/75  Pulse: 71 64 84 87  Resp: 18 18 18 18   Temp: 97.8 F (36.6 C) 98.4 F (36.9 C) 98.8 F (37.1 C) 97.9 F (36.6 C)  TempSrc: Oral Oral Oral Oral  SpO2: 96% 97% 98% 100%  Weight:      Height:        Intake/Output Summary (Last 24 hours) at 09/27/2020 1537  Last data filed at 09/27/2020 1200 Gross per 24 hour  Intake 840 ml  Output --  Net 840 ml   Filed Weights   09/26/20 0500  Weight: 123.5 kg    Examination: General appearance: Adult female, lying in bed, no acute distress, interactive     HEENT: Right-sided facial droop, anicteric, conjunctival pink, lids and lashes normal.  No nasal deformity, discharge, or epistaxis. Skin:  Cardiac: RRR, no murmurs, no lower extremity edema Respiratory: Normal respiratory rate and rhythm, lungs clear without rales or wheezes Abdomen:   MSK:  Neuro: Right-sided weakness in the right arm and leg, right-sided dyskinesia, right-sided facial  droop.  Speech fluent, extraocular movements intact, awake and alert. Psych: Sensorium intact responding to questions, attention normal, affect pleasant, judgment insight appear normal            Data Reviewed: I have personally reviewed following labs and imaging studies:  CBC: Recent Labs  Lab 09/24/20 1628 09/24/20 1634 09/25/20 0347  WBC 10.7*  --  8.8  NEUTROABS 7.2  --   --   HGB 15.2* 16.0* 13.5  HCT 46.2* 47.0* 42.9  MCV 78.6*  --  81.1  PLT 247  --  239   Basic Metabolic Panel: Recent Labs  Lab 09/24/20 1628 09/24/20 1634 09/25/20 0347  NA 133* 135 135  K 4.0 4.1 3.8  CL 99 101 105  CO2 22  --  22  GLUCOSE 310* 314* 351*  BUN 13 13 9   CREATININE 0.59 0.40* 0.54  CALCIUM 9.1  --  8.6*  MG  --   --  1.8   GFR: Estimated Creatinine Clearance: 117.5 mL/min (by C-G formula based on SCr of 0.54 mg/dL). Liver Function Tests: Recent Labs  Lab 09/24/20 1628 09/25/20 0347  AST 14* 12*  ALT 19 17  ALKPHOS 66 64  BILITOT 1.5* 1.1  PROT 6.5 6.0*  ALBUMIN 3.7 3.3*   No results for input(s): LIPASE, AMYLASE in the last 168 hours. No results for input(s): AMMONIA in the last 168 hours. Coagulation Profile: Recent Labs  Lab 09/24/20 1628  INR 1.0   Cardiac Enzymes: Recent Labs  Lab 09/24/20 1628  CKTOTAL 112   BNP (last 3 results) No results for input(s): PROBNP in the last 8760 hours. HbA1C: Recent Labs    09/25/20 0347  HGBA1C 11.7*   CBG: Recent Labs  Lab 09/26/20 1135 09/26/20 1617 09/26/20 2149 09/27/20 0633 09/27/20 1137  GLUCAP 367* 277* 243* 258* 240*   Lipid Profile: Recent Labs    09/25/20 0348  CHOL 212*  HDL 30*  LDLCALC 107*  TRIG 377*  CHOLHDL 7.1   Thyroid Function Tests: No results for input(s): TSH, T4TOTAL, FREET4, T3FREE, THYROIDAB in the last 72 hours. Anemia Panel: No results for input(s): VITAMINB12, FOLATE, FERRITIN, TIBC, IRON, RETICCTPCT in the last 72 hours. Urine analysis:    Component Value  Date/Time   COLORURINE YELLOW 09/24/2020 1919   APPEARANCEUR HAZY (A) 09/24/2020 1919   LABSPEC >1.046 (H) 09/24/2020 1919   PHURINE 5.0 09/24/2020 1919   GLUCOSEU >=500 (A) 09/24/2020 1919   HGBUR NEGATIVE 09/24/2020 1919   BILIRUBINUR NEGATIVE 09/24/2020 1919   KETONESUR 80 (A) 09/24/2020 1919   PROTEINUR NEGATIVE 09/24/2020 1919   NITRITE POSITIVE (A) 09/24/2020 1919   LEUKOCYTESUR TRACE (A) 09/24/2020 1919   Sepsis Labs: @LABRCNTIP (procalcitonin:4,lacticacidven:4)  ) Recent Results (from the past 240 hour(s))  Resp Panel by RT-PCR (Flu A&B, Covid) Nasopharyngeal Swab     Status: None  Collection Time: 09/24/20  4:28 PM   Specimen: Nasopharyngeal Swab; Nasopharyngeal(NP) swabs in vial transport medium  Result Value Ref Range Status   SARS Coronavirus 2 by RT PCR NEGATIVE NEGATIVE Final    Comment: (NOTE) SARS-CoV-2 target nucleic acids are NOT DETECTED.  The SARS-CoV-2 RNA is generally detectable in upper respiratory specimens during the acute phase of infection. The lowest concentration of SARS-CoV-2 viral copies this assay can detect is 138 copies/mL. A negative result does not preclude SARS-Cov-2 infection and should not be used as the sole basis for treatment or other patient management decisions. A negative result may occur with  improper specimen collection/handling, submission of specimen other than nasopharyngeal swab, presence of viral mutation(s) within the areas targeted by this assay, and inadequate number of viral copies(<138 copies/mL). A negative result must be combined with clinical observations, patient history, and epidemiological information. The expected result is Negative.  Fact Sheet for Patients:  BloggerCourse.com  Fact Sheet for Healthcare Providers:  SeriousBroker.it  This test is no t yet approved or cleared by the [place] FDA and  has been authorized for detection and/or diagnosis of  SARS-CoV-2 by FDA under an Emergency Use Authorization (EUA). This EUA will remain  in effect (meaning this test can be used) for the duration of the COVID-19 declaration under Section 564(b)(1) of the Act, 21 U.S.C.section 360bbb-3(b)(1), unless the authorization is terminated  or revoked sooner.       Influenza A by PCR NEGATIVE NEGATIVE Final   Influenza B by PCR NEGATIVE NEGATIVE Final    Comment: (NOTE) The Xpert Xpress SARS-CoV-2/FLU/RSV plus assay is intended as an aid in the diagnosis of influenza from Nasopharyngeal swab specimens and should not be used as a sole basis for treatment. Nasal washings and aspirates are unacceptable for Xpert Xpress SARS-CoV-2/FLU/RSV testing.  Fact Sheet for Patients: BloggerCourse.com  Fact Sheet for Healthcare Providers: SeriousBroker.it  This test is not yet approved or cleared by the [place] FDA and has been authorized for detection and/or diagnosis of SARS-CoV-2 by FDA under an Emergency Use Authorization (EUA). This EUA will remain in effect (meaning this test can be used) for the duration of the COVID-19 declaration under Section 564(b)(1) of the Act, 21 U.S.C. section 360bbb-3(b)(1), unless the authorization is terminated or revoked.  Performed at [facility], [address] [place], [place] [address]          Radiology Studies: No results found.      Scheduled Meds:  aspirin EC  81 mg Oral Daily   atorvastatin  40 mg Oral QHS   clopidogrel  75 mg Oral Daily   glipiZIDE  5 mg Oral QAC breakfast   hydrochlorothiazide  12.5 mg Oral Daily   insulin aspart  0-20 Units Subcutaneous TID WC   insulin aspart  0-5 Units Subcutaneous QHS   insulin aspart  5 Units Subcutaneous TID WC   insulin glargine  20 Units Subcutaneous Daily   sertraline  100 mg Oral Daily   Continuous Infusions:     LOS: 2 days    Time spent: 25  minutes    Alberteen Sam, MD [facility] 09/27/2020, 3:37 PM     Please page though AMION or Epic secure chat:  For [employer], Higher education careers adviser

## 2020-09-27 NOTE — Progress Notes (Signed)
Physical Therapy Treatment Patient Details Name: Amber [name] MRN: [id number] DOB: [birth year] Today's Date: 09/27/2020    History of Present Illness 50 yo female who presented on 09/24/20 with R sided weakness that began on 09/22/20 with multiple falls and episode of laying in floor for 20 hrs and then 8 hrs.  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   Pt with hx of HTN and depression.    PT Comments    Patient declined OOB mobility this session due to fatigue. Patient agreeable to bed level exercises. Exercises focused on slow controlled movement. Educated patient on inpatient rehab, use of R UE during functional tasks, and getting up to use bathroom rather than Pure[place]. Patient highly motivated to return to independence. Continue to recommend comprehensive inpatient rehab (CIR) for post-acute therapy needs.     Follow Up Recommendations  CIR     Equipment Recommendations  Wheelchair cushion (measurements PT);Wheelchair (measurements PT);3in1 (PT);Rolling Keisean Skowron with 5" wheels    Recommendations for Other Services       Precautions / Restrictions Precautions Precautions: Fall Restrictions Weight Bearing Restrictions: No    Mobility  Bed Mobility               General bed mobility comments: declined OOB mobility due to fatigue but agreeable to bed level exercises    Transfers                    Ambulation/Gait                 Stairs             Wheelchair Mobility    Modified Rankin (Stroke Patients Only) Modified Rankin (Stroke Patients Only) Pre-Morbid Rankin Score: No symptoms Modified Rankin: Moderately severe disability     Balance                                            Cognition Arousal/Alertness: Awake/alert Behavior During Therapy: WFL for tasks assessed/performed Overall Cognitive Status: Within Functional Limits for tasks assessed                                         Exercises General Exercises - Lower Extremity Ankle Circles/Pumps: AROM;Both;10 reps;Supine Quad Sets: AROM;Both;10 reps;Supine Heel Slides: AROM;Both;10 reps;Supine Hip ABduction/ADduction: AROM;Both;10 reps;Supine Straight Leg Raises: AROM;Both;10 reps;Supine    General Comments        Pertinent Vitals/Pain Pain Assessment: Faces Faces Pain Scale: Hurts a little bit Pain Location: RUE Pain Descriptors / Indicators: Discomfort;Sore Pain Intervention(s): Monitored during session    Home Living                      Prior Function            PT Goals (current goals can now be found in the care plan section) Acute Rehab PT Goals Patient Stated Goal: to use my hand and walk PT Goal Formulation: With patient Time For Goal Achievement: 10/09/20 Potential to Achieve Goals: Good Progress towards PT goals: Progressing toward goals    Frequency    Min 4X/week      PT Plan Current plan remains appropriate    Co-evaluation  AM-PAC PT "6 Clicks" Mobility   Outcome Measure  Help needed turning from your back to your side while in a flat bed without using bedrails?: A Little Help needed moving from lying on your back to sitting on the side of a flat bed without using bedrails?: A Little Help needed moving to and from a bed to a chair (including a wheelchair)?: A Little Help needed standing up from a chair using your arms (e.g., wheelchair or bedside chair)?: A Little Help needed to walk in hospital room?: A Lot Help needed climbing 3-5 steps with a railing? : A Lot 6 Click Score: 16    End of Session   Activity Tolerance: Patient tolerated treatment well Patient left: in bed;with call bell/phone within reach;with bed alarm set Nurse Communication: Mobility status PT Visit Diagnosis: Unsteadiness on feet (R26.81);Muscle weakness (generalized) (M62.81);Hemiplegia and hemiparesis Hemiplegia - Right/Left:  Right Hemiplegia - dominant/non-dominant: Dominant Hemiplegia - caused by: Cerebral infarction     Time: 9381-8299 PT Time Calculation (min) (ACUTE ONLY): 39 min  Charges:  $Therapeutic Exercise: 23-37 mins $Therapeutic Activity: 8-22 mins                     Zania Kalisz A. Dan Humphreys PT, DPT Acute Rehabilitation Services Pager [phone] Office [phone]    Viviann Spare 09/27/2020, 4:56 PM
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
## 2020-09-27 NOTE — Progress Notes (Signed)
Inpatient Rehab Admissions Coordinator:    I spoke with pt.'s mother and niece and they confirm that they can provide 24/7 support upon d/c from CIR.   Megan Salon, MS, CCC-SLP Rehab Admissions Coordinator  [phone] (celll) [phone] (office)

## 2020-09-28 LAB — GLUCOSE, CAPILLARY
Glucose-Capillary: 292 mg/dL — ABNORMAL HIGH (ref 70–99)
Glucose-Capillary: 297 mg/dL — ABNORMAL HIGH (ref 70–99)
Glucose-Capillary: 169 mg/dL — ABNORMAL HIGH (ref 70–99)
Glucose-Capillary: 278 mg/dL — ABNORMAL HIGH (ref 70–99)

## 2020-09-28 MED ORDER — MAGNESIUM SULFATE 2 GM/50ML IV SOLN
2.0000 g | Freq: Once | INTRAVENOUS | Status: AC
  Administered 2020-09-28: 2 g via INTRAVENOUS
  Filled 2020-09-28: qty 50

## 2020-09-28 MED ORDER — INSULIN GLARGINE 100 UNIT/ML ~~LOC~~ SOLN
40.0000 [IU] | Freq: Every day | SUBCUTANEOUS | Status: DC
  Administered 2020-09-28: 40 [IU] via SUBCUTANEOUS
  Filled 2020-09-28 (×3): qty 0.4

## 2020-09-28 NOTE — Progress Notes (Signed)
[facility] PROGRESS NOTE    Amber [name]  WSF:[id number] DOB: 1970-07-30 DOA: 09/24/2020 PCP: Pcp, No      Brief Narrative:  Mrs. Petree is a 50 y.o. F with HTN, DM, who presented with right-sided weakness.  2 days prior to admission, the patient had sudden onset right hemiparesis and right facial droop.  She did not want to inconvenience anyone to drive her to the ER, so she waited until 7/6 to present.  In the ER, CT head suggested new stroke.  Neurology were consulted.         Assessment & Plan:  Acute stroke Admitted and MRI showed left pons infarct.  Had persistent marked right sided hemiparesis.  Noninvasive angiography showed likely cause of lesion with significant atherosclerosis and basilar artery feeding pons.  -Echocardiogram showed no cardiogenic source, carotid imaging without significant stenosis - Needs outpatient sleep study - Lipids ordered, continue Atorvastatin - Aspirin ordered at admission, continue aspirin and Plavix for 3 months, then aspirin 81 mg alone indefinitely - No atrial fibrillation on telemetry, likely atheroembolic from basilar artery - tPA not given because outside the window - Dysphagia screen ordered in the ER - PT evaluation ordered, recommending CIR - Non-smoker      Diabetes Glucose elevated still  A1c 11% Previously was on aspart 40 units twice daily and metformin and had reasonable control.  She recalls prior to that at some point being on a GLP-1 agonist glipizide and having also good control.  Got over 25 units correction insulin yesterday -Continue Lantus, increased dose from 20-40 - Continue sliding scale corrections - Continue glipizide - Resume semaglutide lower dose at discharge with close PCP follow-up    Anxiety - Continue sertraline  Hypertension Blood pressure controlled - Continue HCTZ - Continue lisinopril discharge   Asymptomatic bacteriuria No treatment necessary    Hypomagnesemia - Replete magnesium     Disposition: Status is: Inpatient  Remains inpatient appropriate because: she has dense right hemiparesis and will require residential rehabilitation either inpatient rehab or SNF  Dispo: The patient is from: Home              Anticipated d/c is to:  TBD              Patient currently is not medically stable to d/c.   Difficult to place patient No           Level of care: Med-Surg       MDM: The below labs and imaging reports were reviewed and summarized above.  Medication management as above.   DVT prophylaxis: SCDs Start: 09/24/20 1825  Code Status: FULL Family Communication:              Subjective: No new confusion, fever, chest pain, dyspnea, cramps in the foot.  Her strength is slightly better, but she is still severely impaired.    Objective: Vitals:   09/27/20 2319 09/28/20 0428 09/28/20 0500 09/28/20 0730  BP: 94/83 137/78  (!) 144/75  Pulse: 82 75  69  Resp: 18 16  14   Temp: 98.5 F (36.9 C) 98 F (36.7 C)  98.1 F (36.7 C)  TempSrc: Oral Oral  Oral  SpO2: 97% 96%  96%  Weight:   125.7 kg   Height:        Intake/Output Summary (Last 24 hours) at 09/28/2020 1043 Last data filed at 09/27/2020 2300 Gross per 24 hour  Intake 220 ml  Output 1500 ml  Net -1280 ml  Filed Weights   09/26/20 0500 09/28/20 0500  Weight: 123.5 kg 125.7 kg    Examination: General appearance: Adult female, lying in bed, interactive, no acute distress     HEENT: Right-sided facial droop looks improved, anicteric, conjunctive are pink, lids and lashes normal, no nasal deformity, discharge, or epistaxis Skin:  Cardiac: RRR, no murmurs, no lower extremity edema Respiratory: Normal respiratory rate and rhythm, lungs clear without rales or wheezes Abdomen:   MSK:  Neuro: Right-sided weakness in the right arm and leg, dyskinesia, right-sided facial droop.  Speech fluent, extraocular movements intact, awake and  alert. Psych: sesosiurm Intact responding to questions, attention normal, affect normal, judgment insight appear normal            Data Reviewed: I have personally reviewed following labs and imaging studies:  CBC: Recent Labs  Lab 09/24/20 1628 09/24/20 1634 09/25/20 0347  WBC 10.7*  --  8.8  NEUTROABS 7.2  --   --   HGB 15.2* 16.0* 13.5  HCT 46.2* 47.0* 42.9  MCV 78.6*  --  81.1  PLT 247  --  239   Basic Metabolic Panel: Recent Labs  Lab 09/24/20 1628 09/24/20 1634 09/25/20 0347 09/27/20 1500  NA 133* 135 135 136  K 4.0 4.1 3.8 4.0  CL 99 101 105 102  CO2 22  --  22 25  GLUCOSE 310* 314* 351* 200*  BUN 13 13 9 14   CREATININE 0.59 0.40* 0.54 0.57  CALCIUM 9.1  --  8.6* 9.4  MG  --   --  1.8 1.6*   GFR: Estimated Creatinine Clearance: 118.7 mL/min (by C-G formula based on SCr of 0.57 mg/dL). Liver Function Tests: Recent Labs  Lab 09/24/20 1628 09/25/20 0347  AST 14* 12*  ALT 19 17  ALKPHOS 66 64  BILITOT 1.5* 1.1  PROT 6.5 6.0*  ALBUMIN 3.7 3.3*   No results for input(s): LIPASE, AMYLASE in the last 168 hours. No results for input(s): AMMONIA in the last 168 hours. Coagulation Profile: Recent Labs  Lab 09/24/20 1628  INR 1.0   Cardiac Enzymes: Recent Labs  Lab 09/24/20 1628  CKTOTAL 112   BNP (last 3 results) No results for input(s): PROBNP in the last 8760 hours. HbA1C: No results for input(s): HGBA1C in the last 72 hours.  CBG: Recent Labs  Lab 09/27/20 0633 09/27/20 1137 09/27/20 1616 09/27/20 2114 09/28/20 0615  GLUCAP 258* 240* 197* 257* 292*   Lipid Profile: No results for input(s): CHOL, HDL, LDLCALC, TRIG, CHOLHDL, LDLDIRECT in the last 72 hours.  Thyroid Function Tests: No results for input(s): TSH, T4TOTAL, FREET4, T3FREE, THYROIDAB in the last 72 hours. Anemia Panel: No results for input(s): VITAMINB12, FOLATE, FERRITIN, TIBC, IRON, RETICCTPCT in the last 72 hours. Urine analysis:    Component Value Date/Time    COLORURINE YELLOW 09/24/2020 1919   APPEARANCEUR HAZY (A) 09/24/2020 1919   LABSPEC >1.046 (H) 09/24/2020 1919   PHURINE 5.0 09/24/2020 1919   GLUCOSEU >=500 (A) 09/24/2020 1919   HGBUR NEGATIVE 09/24/2020 1919   BILIRUBINUR NEGATIVE 09/24/2020 1919   KETONESUR 80 (A) 09/24/2020 1919   PROTEINUR NEGATIVE 09/24/2020 1919   NITRITE POSITIVE (A) 09/24/2020 1919   LEUKOCYTESUR TRACE (A) 09/24/2020 1919   Sepsis Labs: @LABRCNTIP (procalcitonin:4,lacticacidven:4)  ) Recent Results (from the past 240 hour(s))  Resp Panel by RT-PCR (Flu A&B, Covid) Nasopharyngeal Swab     Status: None   Collection Time: 09/24/20  4:28 PM   Specimen: Nasopharyngeal Swab; Nasopharyngeal(NP) swabs in  vial transport medium  Result Value Ref Range Status   SARS Coronavirus 2 by RT PCR NEGATIVE NEGATIVE Final    Comment: (NOTE) SARS-CoV-2 target nucleic acids are NOT DETECTED.  The SARS-CoV-2 RNA is generally detectable in upper respiratory specimens during the acute phase of infection. The lowest concentration of SARS-CoV-2 viral copies this assay can detect is 138 copies/mL. A negative result does not preclude SARS-Cov-2 infection and should not be used as the sole basis for treatment or other patient management decisions. A negative result may occur with  improper specimen collection/handling, submission of specimen other than nasopharyngeal swab, presence of viral mutation(s) within the areas targeted by this assay, and inadequate number of viral copies(<138 copies/mL). A negative result must be combined with clinical observations, patient history, and epidemiological information. The expected result is Negative.  Fact Sheet for Patients:  BloggerCourse.com  Fact Sheet for Healthcare Providers:  SeriousBroker.it  This test is no t yet approved or cleared by the [place] FDA and  has been authorized for detection and/or diagnosis of SARS-CoV-2  by FDA under an Emergency Use Authorization (EUA). This EUA will remain  in effect (meaning this test can be used) for the duration of the COVID-19 declaration under Section 564(b)(1) of the Act, 21 U.S.C.section 360bbb-3(b)(1), unless the authorization is terminated  or revoked sooner.       Influenza A by PCR NEGATIVE NEGATIVE Final   Influenza B by PCR NEGATIVE NEGATIVE Final    Comment: (NOTE) The Xpert Xpress SARS-CoV-2/FLU/RSV plus assay is intended as an aid in the diagnosis of influenza from Nasopharyngeal swab specimens and should not be used as a sole basis for treatment. Nasal washings and aspirates are unacceptable for Xpert Xpress SARS-CoV-2/FLU/RSV testing.  Fact Sheet for Patients: BloggerCourse.com  Fact Sheet for Healthcare Providers: SeriousBroker.it  This test is not yet approved or cleared by the [place] FDA and has been authorized for detection and/or diagnosis of SARS-CoV-2 by FDA under an Emergency Use Authorization (EUA). This EUA will remain in effect (meaning this test can be used) for the duration of the COVID-19 declaration under Section 564(b)(1) of the Act, 21 U.S.C. section 360bbb-3(b)(1), unless the authorization is terminated or revoked.  Performed at [facility], [address] [place], [place] [address]          Radiology Studies: No results found.      Scheduled Meds:  aspirin EC  81 mg Oral Daily   atorvastatin  40 mg Oral QHS   clopidogrel  75 mg Oral Daily   glipiZIDE  5 mg Oral QAC breakfast   hydrochlorothiazide  12.5 mg Oral Daily   insulin aspart  0-20 Units Subcutaneous TID WC   insulin aspart  0-5 Units Subcutaneous QHS   insulin aspart  5 Units Subcutaneous TID WC   insulin glargine  40 Units Subcutaneous Daily   sertraline  100 mg Oral Daily   Continuous Infusions:  magnesium sulfate bolus IVPB        LOS: 3 days    Time spent: 25   minutes    Alberteen Sam, MD [facility] 09/28/2020, 10:43 AM     Please page though AMION or Epic secure chat:  For [employer], Higher education careers adviser

## 2020-09-29 LAB — GLUCOSE, CAPILLARY
Glucose-Capillary: 263 mg/dL — ABNORMAL HIGH (ref 70–99)
Glucose-Capillary: 251 mg/dL — ABNORMAL HIGH (ref 70–99)
Glucose-Capillary: 192 mg/dL — ABNORMAL HIGH (ref 70–99)
Glucose-Capillary: 252 mg/dL — ABNORMAL HIGH (ref 70–99)

## 2020-09-29 MED ORDER — CEPHALEXIN 500 MG PO CAPS
500.0000 mg | ORAL_CAPSULE | Freq: Three times a day (TID) | ORAL | Status: DC
  Administered 2020-09-29 – 2020-10-01 (×7): 500 mg via ORAL
  Filled 2020-09-29 (×7): qty 1

## 2020-09-29 MED ORDER — INSULIN ASPART 100 UNIT/ML IJ SOLN
8.0000 [IU] | Freq: Three times a day (TID) | INTRAMUSCULAR | Status: DC
  Administered 2020-09-29 – 2020-10-01 (×8): 8 [IU] via SUBCUTANEOUS

## 2020-09-29 MED ORDER — INSULIN GLARGINE 100 UNIT/ML ~~LOC~~ SOLN
50.0000 [IU] | Freq: Every day | SUBCUTANEOUS | Status: DC
  Administered 2020-09-29: 50 [IU] via SUBCUTANEOUS
  Filled 2020-09-29 (×2): qty 0.5

## 2020-09-29 NOTE — Progress Notes (Signed)
SLP Cancellation Note  Patient Details Name: Amber [name] MRN: [id number] DOB: 12-Feb-1971   Cancelled treatment:       Reason Eval/Treat Not Completed: SLP screened, no needs identified, will sign off; BSE ordered inadvertently and pt reported no issues with swallowing; [facility] swallow screen passed on 2020-10-20.   Tressie Stalker, M.S., CCC-SLP 09/29/2020, 2:28 PM

## 2020-09-29 NOTE — Progress Notes (Signed)
Inpatient Rehab Admissions Coordinator:    I do not have insurance auth or a bed for this pt. On CIR today. I await an updated OT note so that I can open a case with Pt.'s insurer.  I will continue to follow for potential admit pending insurance auth and bed availability.  Megan Salon, MS, CCC-SLP Rehab Admissions Coordinator  [phone] (celll) [phone] (office)

## 2020-09-29 NOTE — Progress Notes (Signed)
Physical Therapy Treatment Patient Details Name: Amber [name] MRN: [id number] DOB: 1971-02-06 Today's Date: 09/29/2020    History of Present Illness 50 yo female who presented on 09/24/20 with R sided weakness that began on 09/22/20 with multiple falls and episode of laying in floor for 20 hrs and then 8 hrs.  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   Pt with hx of HTN and depression.    PT Comments    Today's skilled session continued to focus on mobility progression and gait. Pt able to increase her gait distance this session. The pt is progressing toward goals and should benefit from continued PT to progress toward unmet goals.     Follow Up Recommendations  CIR     Equipment Recommendations  Wheelchair cushion (measurements PT);Wheelchair (measurements PT);3in1 (PT);Rolling walker with 5" wheels    Recommendations for Other Services Rehab consult     Precautions / Restrictions Precautions Precautions: Fall Restrictions Weight Bearing Restrictions: No    Mobility  Bed Mobility Overal bed mobility: Needs Assistance Bed Mobility: Rolling;Sidelying to Sit Rolling: Min assist Sidelying to sit: Min assist;Mod assist   Sit to supine: Min guard   General bed mobility comments: min assist to roll onto left side with cues to reach right arm over toward rail. with rail/HOB 20 degrees, mod assist to elevate trunk into sitting position at edge of bed while pt brought her legs off the edge of the bed.    Transfers Overall transfer level: Needs assistance Equipment used: Rolling walker (2 wheeled) Transfers: Sit to/from Stand Sit to Stand: Min assist         General transfer comment: from low bed to RW with cues for hand placement and weight shifting.  Ambulation/Gait Ambulation/Gait assistance: Mod assist;Min assist Gait Distance (Feet): 30 Feet Assistive device: Rolling walker (2 wheeled) Gait Pattern/deviations: Decreased  stride length;Decreased weight shift to right;Step-through pattern Gait velocity: decreased   General Gait Details: Demos mild decreased safety awareness with increasing gait speed and not attending to right UE/LE with gait. cues for right step placement and right hand placement through out gait. Pt needed cues/assist for walker management and processing -would take walker right up to target with gait (door, bed), then needed cues to turn around at door to continue gait, and to turn around at bed to sit down. When at edge of bed, prior to sitting had pt side step with cues needed on walker management to move the walker as she was stepping into the side of it.       Modified Rankin (Stroke Patients Only) Modified Rankin (Stroke Patients Only) Pre-Morbid Rankin Score: No symptoms Modified Rankin: Moderately severe disability     Balance Overall balance assessment: Needs assistance Sitting-balance support: Feet supported;No upper extremity supported Sitting balance-Leahy Scale: Good Sitting balance - Comments: sitting EOB during UE exercises Postural control: Left lateral lean Standing balance support: Single extremity supported;During functional activity Standing balance-Leahy Scale: Poor Standing balance comment: standing and taking side steps toward HOB, supporting self on bed rail.              Cognition Arousal/Alertness: Awake/alert Behavior During Therapy: WFL for tasks assessed/performed;Impulsive Overall Cognitive Status: Within Functional Limits for tasks assessed                 General Comments: Pt was motivated to participate with session. Cues for safety with use of RW and safety/problem solving with mobility at times  Exercises General Exercises - Lower Extremity Long Arc Quad: AROM;Strengthening;Both;10 reps;Seated Heel Raises: AROM;Strengthening;Both;10 reps;Seated Mini-Sqauts: AROM;Strengthening;Both;10 reps;Seated Other Exercises Other Exercises:  RUE reaching across midline to grasp soft squeeze cube and then dropping into cupholder. x20. Cues for reducing compensatory hiking at shoulder and internal rotation. Providing cues for normalizing movement pattern including digit extension, external rotation of forearm, and release of grasp. Other Exercises: RUE grasp soft squeeze cube on R side of table and then stacking on top of two blocks. x20. Cues for normalizing movement pattern.    General Comments General comments (skin integrity, edema, etc.): Pt pleasant and conversational. Reporting she has been working on RUE exercises and functional use this weekend.      Pertinent Vitals/Pain Pain Assessment: 0-10 Pain Score: 6  Faces Pain Scale: Hurts a little bit Pain Location: RUE where IV was removed Pain Descriptors / Indicators: Discomfort;Sore Pain Intervention(s): Limited activity within patient's tolerance;Monitored during session;Heat applied (pt with warm compress already, reapplied after session)     PT Goals (current goals can now be found in the care plan section) Acute Rehab PT Goals Patient Stated Goal: to use my hand and walk PT Goal Formulation: With patient Time For Goal Achievement: 10/09/20 Potential to Achieve Goals: Good Additional Goals Additional Goal #1: Will increase R leg strength to 5/5 for gait and stairs Progress towards PT goals: Progressing toward goals    Frequency    Min 4X/week      PT Plan Current plan remains appropriate    AM-PAC PT "6 Clicks" Mobility   Outcome Measure  Help needed turning from your back to your side while in a flat bed without using bedrails?: A Little Help needed moving from lying on your back to sitting on the side of a flat bed without using bedrails?: A Little Help needed moving to and from a bed to a chair (including a wheelchair)?: A Little Help needed standing up from a chair using your arms (e.g., wheelchair or bedside chair)?: A Little Help needed to walk in  hospital room?: A Lot Help needed climbing 3-5 steps with a railing? : A Lot 6 Click Score: 16    End of Session Equipment Utilized During Treatment: Gait belt Activity Tolerance: Patient tolerated treatment well Patient left: in bed;with call bell/phone within reach;with bed alarm set Nurse Communication: Mobility status PT Visit Diagnosis: Unsteadiness on feet (R26.81);Muscle weakness (generalized) (M62.81);Hemiplegia and hemiparesis Hemiplegia - Right/Left: Right Hemiplegia - dominant/non-dominant: Dominant Hemiplegia - caused by: Cerebral infarction     Time: 3500-9381 PT Time Calculation (min) (ACUTE ONLY): 20 min  Charges:  $Gait Training: 8-22 mins                    Sallyanne Kuster, PTA, CLT Acute [employer] Office- [phone] 09/29/20, 11:52 AM   Sallyanne Kuster 09/29/2020, 11:52 AM

## 2020-09-29 NOTE — Progress Notes (Signed)
[facility] PROGRESS NOTE    Amber [name]  RXV:[id number] DOB: 1970-05-17 DOA: 09/24/2020 PCP: Pcp, No      Brief Narrative:  Amber [name] is a 50 y.o. F with HTN, DM, who presented with right-sided weakness.  2 days prior to admission, the patient had sudden onset right hemiparesis and right facial droop.  She did not want to inconvenience anyone to drive her to the ER, so she waited until 7/6 to present.  In the ER, CT head suggested new stroke.  Neurology were consulted.         Assessment & Plan:  Acute stroke Admitted and MRI showed left pons infarct.  Had persistent marked right sided hemiparesis.  Noninvasive angiography showed likely cause of lesion with significant atherosclerosis and basilar artery feeding pons.  -Echocardiogram showed no cardiogenic source, carotid imaging without significant stenosis - Needs outpatient sleep study - Lipids ordered, Continue atorvastatin - Aspirin ordered at admission, continue aspirin and Plavix for 3 months, then aspirin 81 mg alone indefinitely - No atrial fibrillation on telemetry, likely atheroembolic from basilar artery - tPA not given because outside the window - Dysphagia screen ordered in the ER - PT evaluation ordered, recommending CIR - Non-smoker      Diabetes Glucose elevated still  A1c 11% Previously was on aspart 40 units twice daily and metformin and had reasonable control.  She recalls prior to that at some point being on a GLP-1 agonist glipizide and having also good control.  -Continue Lantus, increase dose again - Continue sliding scale corrections - Continue glipizide - Resume semaglutide lower dose atdischarge with close PCP follow-up    Thrombophlebitis Right antecubital redness and pain and swelling today after IV removed.  Tried hot pack, didn't work.  No more painful and red and swelling, Temp 81F.  -Start cephalexin 7 days  Anxiety - Continue sertraline  Hypertension Blood  pressure controlled - Continue HCTZ - Continue lisinopril at discharge   Asymptomatic bacteriuria No treatment necessary   Hypomagnesemia Resolved     Disposition: Status is: Inpatient  Remains inpatient appropriate because: she has dense right hemiparesis and will require residential rehabilitation either inpatient rehab or SNF  Dispo: The patient is from: Home              Anticipated d/c is to:  TBD              Patient currently is not medically stable to d/c.   Difficult to place patient No           Level of care: Med-Surg       MDM: The below labs and imaging reports were reviewed and summarized above.  Medication management as above.   DVT prophylaxis: SCDs Start: 09/24/20 1825  Code Status: FULL Family Communication:              Subjective: She has redness and swelling and pain in the right antecubital area.  Feels uncomfortable, feels it spreading, low-grade fever.  No chest pain, dyspnea.  Her right arm strength is slightly better, but still markedly impaired.  Objective: Vitals:   09/29/20 0748 09/29/20 1119 09/29/20 1545 09/29/20 1607  BP: 119/79 (!) 151/83 (!) 154/75   Pulse: 90 96 98   Resp: 18 18 18    Temp: 98.6 F (37 C) 98.3 F (36.8 C)  99.7 F (37.6 C)  TempSrc: Oral Oral  Oral  SpO2: 97% 98% 95%   Weight:      Height:  Intake/Output Summary (Last 24 hours) at 09/29/2020 1645 Last data filed at 09/29/2020 0854 Gross per 24 hour  Intake --  Output 600 ml  Net -600 ml   Filed Weights   09/26/20 0500 09/28/20 0500 09/29/20 0500  Weight: 123.5 kg 125.7 kg 123.9 kg    Examination: General appearance: Adult female, lying in bed, interactive, no acute distress     HEENT: Right-sided facial droop looks improved, anicteric, conjunctive are pink, lids and lashes normal, no nasal deformity, discharge, or epistaxis Skin:  Cardiac: RRR, no murmurs, no lower extremity edema Respiratory: Normal respiratory rate and  rhythm, lungs clear without rales or wheezes Abdomen:   MSK:  Neuro: Right-sided weakness in the right arm and leg, dyskinesia, right-sided facial droop.  Speech fluent, extraocular movements intact, awake and alert. Psych: sesosiurm Intact responding to questions, attention normal, affect normal, judgment insight appear normal            Data Reviewed: I have personally reviewed following labs and imaging studies:  CBC: Recent Labs  Lab 09/24/20 1628 09/24/20 1634 09/25/20 0347  WBC 10.7*  --  8.8  NEUTROABS 7.2  --   --   HGB 15.2* 16.0* 13.5  HCT 46.2* 47.0* 42.9  MCV 78.6*  --  81.1  PLT 247  --  239   Basic Metabolic Panel: Recent Labs  Lab 09/24/20 1628 09/24/20 1634 09/25/20 0347 09/27/20 1500  NA 133* 135 135 136  K 4.0 4.1 3.8 4.0  CL 99 101 105 102  CO2 22  --  22 25  GLUCOSE 310* 314* 351* 200*  BUN 13 13 9 14   CREATININE 0.59 0.40* 0.54 0.57  CALCIUM 9.1  --  8.6* 9.4  MG  --   --  1.8 1.6*   GFR: Estimated Creatinine Clearance: 117.8 mL/min (by C-G formula based on SCr of 0.57 mg/dL). Liver Function Tests: Recent Labs  Lab 09/24/20 1628 09/25/20 0347  AST 14* 12*  ALT 19 17  ALKPHOS 66 64  BILITOT 1.5* 1.1  PROT 6.5 6.0*  ALBUMIN 3.7 3.3*   No results for input(s): LIPASE, AMYLASE in the last 168 hours. No results for input(s): AMMONIA in the last 168 hours. Coagulation Profile: Recent Labs  Lab 09/24/20 1628  INR 1.0   Cardiac Enzymes: Recent Labs  Lab 09/24/20 1628  CKTOTAL 112   BNP (last 3 results) No results for input(s): PROBNP in the last 8760 hours. HbA1C: No results for input(s): HGBA1C in the last 72 hours.  CBG: Recent Labs  Lab 09/28/20 1154 09/28/20 1602 09/28/20 2123 09/29/20 0633 09/29/20 1118  GLUCAP 297* 169* 278* 263* 251*   Lipid Profile: No results for input(s): CHOL, HDL, LDLCALC, TRIG, CHOLHDL, LDLDIRECT in the last 72 hours.  Thyroid Function Tests: No results for input(s): TSH, T4TOTAL,  FREET4, T3FREE, THYROIDAB in the last 72 hours. Anemia Panel: No results for input(s): VITAMINB12, FOLATE, FERRITIN, TIBC, IRON, RETICCTPCT in the last 72 hours. Urine analysis:    Component Value Date/Time   COLORURINE YELLOW 09/24/2020 1919   APPEARANCEUR HAZY (A) 09/24/2020 1919   LABSPEC >1.046 (H) 09/24/2020 1919   PHURINE 5.0 09/24/2020 1919   GLUCOSEU >=500 (A) 09/24/2020 1919   HGBUR NEGATIVE 09/24/2020 1919   BILIRUBINUR NEGATIVE 09/24/2020 1919   KETONESUR 80 (A) 09/24/2020 1919   PROTEINUR NEGATIVE 09/24/2020 1919   NITRITE POSITIVE (A) 09/24/2020 1919   LEUKOCYTESUR TRACE (A) 09/24/2020 1919   Sepsis Labs: @LABRCNTIP (procalcitonin:4,lacticacidven:4)  ) Recent Results (from the  past 240 hour(s))  Resp Panel by RT-PCR (Flu A&B, Covid) Nasopharyngeal Swab     Status: None   Collection Time: 09/24/20  4:28 PM   Specimen: Nasopharyngeal Swab; Nasopharyngeal(NP) swabs in vial transport medium  Result Value Ref Range Status   SARS Coronavirus 2 by RT PCR NEGATIVE NEGATIVE Final    Comment: (NOTE) SARS-CoV-2 target nucleic acids are NOT DETECTED.  The SARS-CoV-2 RNA is generally detectable in upper respiratory specimens during the acute phase of infection. The lowest concentration of SARS-CoV-2 viral copies this assay can detect is 138 copies/mL. A negative result does not preclude SARS-Cov-2 infection and should not be used as the sole basis for treatment or other patient management decisions. A negative result may occur with  improper specimen collection/handling, submission of specimen other than nasopharyngeal swab, presence of viral mutation(s) within the areas targeted by this assay, and inadequate number of viral copies(<138 copies/mL). A negative result must be combined with clinical observations, patient history, and epidemiological information. The expected result is Negative.  Fact Sheet for Patients:  BloggerCourse.com  Fact Sheet  for Healthcare Providers:  SeriousBroker.it  This test is no t yet approved or cleared by the [place] FDA and  has been authorized for detection and/or diagnosis of SARS-CoV-2 by FDA under an Emergency Use Authorization (EUA). This EUA will remain  in effect (meaning this test can be used) for the duration of the COVID-19 declaration under Section 564(b)(1) of the Act, 21 U.S.C.section 360bbb-3(b)(1), unless the authorization is terminated  or revoked sooner.       Influenza A by PCR NEGATIVE NEGATIVE Final   Influenza B by PCR NEGATIVE NEGATIVE Final    Comment: (NOTE) The Xpert Xpress SARS-CoV-2/FLU/RSV plus assay is intended as an aid in the diagnosis of influenza from Nasopharyngeal swab specimens and should not be used as a sole basis for treatment. Nasal washings and aspirates are unacceptable for Xpert Xpress SARS-CoV-2/FLU/RSV testing.  Fact Sheet for Patients: BloggerCourse.com  Fact Sheet for Healthcare Providers: SeriousBroker.it  This test is not yet approved or cleared by the [place] FDA and has been authorized for detection and/or diagnosis of SARS-CoV-2 by FDA under an Emergency Use Authorization (EUA). This EUA will remain in effect (meaning this test can be used) for the duration of the COVID-19 declaration under Section 564(b)(1) of the Act, 21 U.S.C. section 360bbb-3(b)(1), unless the authorization is terminated or revoked.  Performed at [facility], [address] [place], [place] [address]          Radiology Studies: No results found.      Scheduled Meds:  aspirin EC  81 mg Oral Daily   atorvastatin  40 mg Oral QHS   clopidogrel  75 mg Oral Daily   glipiZIDE  5 mg Oral QAC breakfast   hydrochlorothiazide  12.5 mg Oral Daily   insulin aspart  0-20 Units Subcutaneous TID WC   insulin aspart  0-5 Units Subcutaneous QHS   insulin aspart  8 Units  Subcutaneous TID WC   insulin glargine  50 Units Subcutaneous Daily   sertraline  100 mg Oral Daily   Continuous Infusions:      LOS: 4 days    Time spent: 25  minutes    Alberteen Sam, MD [facility] 09/29/2020, 4:45 PM     Please page though AMION or Epic secure chat:  For [employer], Higher education careers adviser

## 2020-09-29 NOTE — Progress Notes (Signed)
Occupational Therapy Evaluation Patient Details Name: Amber [name] MRN: [id number] DOB: 12/22/1970 Today's Date: 09/29/2020    History of Present Illness 50 yo female who presented on 09/24/20 with R sided weakness that began on 09/22/20 with multiple falls and episode of laying in floor for 20 hrs and then 8 hrs.  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   Pt with hx of HTN and depression.   Clinical Impression   Pt progressing towards established OT goals. Focus session on challenging UE strength, AROM, and coordination. Pt performing grasp and release exercises with RUE reaching across midline. Cues for normalizing movement pattern and decreasing compensatory shoulder hiking/internal rotation. Due to pt age, motivation, and significant change in functional status, continue to highly recommend discharge to CIR for intensive OT and will follow acutely to optimize independence in ADL/IADLs.     Follow Up Recommendations  CIR    Equipment Recommendations  None recommended by OT    Recommendations for Other Services Rehab consult     Precautions / Restrictions Precautions Precautions: Fall Restrictions Weight Bearing Restrictions: No      Mobility Bed Mobility Overal bed mobility: Needs Assistance Bed Mobility: Sit to Supine       Sit to supine: Min guard   General bed mobility comments: Pt requiring min Guard A for safety    Transfers Overall transfer level: Needs assistance   Transfers: Sit to/from Stand Sit to Stand: Min guard         General transfer comment: Min Guard A for safety to stand and take side steps toward [facility] holding onto bed rail    Balance Overall balance assessment: Needs assistance Sitting-balance support: Feet supported;No upper extremity supported Sitting balance-Leahy Scale: Good Sitting balance - Comments: sitting EOB during UE exercises Postural control: Left lateral lean Standing balance  support: Single extremity supported;During functional activity Standing balance-Leahy Scale: Poor Standing balance comment: standing and taking side steps toward HOB, supporting self on bed rail.                           ADL either performed or assessed with clinical judgement   ADL Overall ADL's : Needs assistance/impaired                                       General ADL Comments: focus session on education for RUE exercises, see exercises section.     Vision   Vision Assessment?: Vision impaired- to be further tested in functional context     Perception     Praxis      Pertinent Vitals/Pain Pain Assessment: Faces Faces Pain Scale: Hurts a little bit Pain Location: RUE Pain Descriptors / Indicators: Discomfort;Sore Pain Intervention(s): Limited activity within patient's tolerance;Monitored during session     Hand Dominance     Extremity/Trunk Assessment Upper Extremity Assessment Upper Extremity Assessment: RUE deficits/detail RUE Deficits / Details: Apraxia, poor motor planning, and decreased pinch/grasp strength. RUE Coordination: decreased gross motor;decreased fine motor   Lower Extremity Assessment Lower Extremity Assessment: Defer to PT evaluation       Communication     Cognition Arousal/Alertness: Awake/alert Behavior During Therapy: WFL for tasks assessed/performed Overall Cognitive Status: Within Functional Limits for tasks assessed  General Comments: Very motivated to participate in UE exercises, and attentive to details of instructions for normalized movement.   General Comments  Pt pleasant and conversational. Reporting she has been working on RUE exercises and functional use this weekend.    Exercises Exercises: Other exercises Other Exercises Other Exercises: RUE reaching across midline to grasp soft squeeze cube and then dropping into cupholder. x20. Cues for reducing  compensatory hiking at shoulder and internal rotation. Providing cues for normalizing movement pattern including digit extension, external rotation of forearm, and release of grasp. Other Exercises: RUE grasp soft squeeze cube on R side of table and then stacking on top of two blocks. x20. Cues for normalizing movement pattern.   Shoulder Instructions      Home Living                                          Prior Functioning/Environment                   OT Problem List:        OT Treatment/Interventions:      OT Goals(Current goals can be found in the care plan section) Acute Rehab OT Goals Patient Stated Goal: to use my hand and walk OT Goal Formulation: With patient Time For Goal Achievement: 10/09/20 Potential to Achieve Goals: Good ADL Goals Pt Will Perform Grooming: with min guard assist;standing Pt Will Perform Upper Body Bathing: with set-up;sitting Pt Will Perform Lower Body Bathing: with min guard assist;sit to/from stand Pt Will Perform Upper Body Dressing: with set-up;sitting Pt Will Perform Lower Body Dressing: with min guard assist;sit to/from stand Pt Will Transfer to Toilet: with min guard assist;ambulating;regular height toilet;bedside commode;grab bars Pt Will Perform Toileting - Clothing Manipulation and hygiene: with min guard assist;sit to/from stand Pt/caregiver will Perform Home Exercise Program: Increased ROM;Right Upper extremity;With Supervision;With written HEP provided Additional ADL Goal #1: Pt will use Rt UE as an active assist during ADLs consistently  OT Frequency: Min 2X/week   Barriers to D/C:            Co-evaluation              AM-PAC OT "6 Clicks" Daily Activity     Outcome Measure Help from another person eating meals?: A Little Help from another person taking care of personal grooming?: A Little Help from another person toileting, which includes using toliet, bedpan, or urinal?: A Lot Help from another  person bathing (including washing, rinsing, drying)?: A Lot Help from another person to put on and taking off regular upper body clothing?: A Little Help from another person to put on and taking off regular lower body clothing?: A Lot 6 Click Score: 15   End of Session Nurse Communication: Mobility status  Activity Tolerance: Patient tolerated treatment well Patient left: in bed;with call bell/phone within reach;with bed alarm set  OT Visit Diagnosis: Unsteadiness on feet (R26.81);Hemiplegia and hemiparesis;Muscle weakness (generalized) (M62.81);History of falling (Z91.81) Hemiplegia - Right/Left: Right Hemiplegia - dominant/non-dominant: Dominant Hemiplegia - caused by: Cerebral infarction                Time: 0935-1001 OT Time Calculation (min): 26 min Charges:  OT General Charges $OT Visit: 1 Visit OT Treatments $Neuromuscular Re-education: 23-37 mins  Ladene Artist, OTDS   Ladene Artist 09/29/2020, 10:14 AM

## 2020-09-29 NOTE — Evaluation (Signed)
Speech Language Pathology Evaluation Patient Details Name: Amber [name] MRN: [id number] DOB: 1970/04/25 Today's Date: 09/29/2020 Time: 6073-7106 SLP Time Calculation (min) (ACUTE ONLY): 27 min  Problem List:  Patient Active Problem List   Diagnosis Date Noted   Hyperglycemia 09/25/2020   Stroke (HCC) 09/25/2020   HTN (hypertension)    DM2 (diabetes mellitus, type 2) (HCC)    HLD (hyperlipidemia)    Depression    Acute ischemic stroke (HCC) 09/24/2020   Past Medical History:  Past Medical History:  Diagnosis Date   Depression    DM2 (diabetes mellitus, type 2) (HCC)    HLD (hyperlipidemia)    HTN (hypertension)    Past Surgical History: History reviewed. No pertinent surgical history. HPI:  50 yo female who presented on 09/24/20 with R sided weakness that began on 09/22/20 with multiple falls and episode of laying in floor for 20 hrs and then 8 hrs.  She was found to have acute L pontine (46mm L pons) CVA with moderate basilar stenosis.   Pt with hx of HTN and depression   Assessment / Plan / Recommendation Clinical Impression  Pt assessed via the SLUMS ([employer] Mental Status Examination) with a score of 27/27 obtained with writing portion eliminated d/t R hand affected by CVA.  A typical score on this assessment is 27/30, so even excluding the writing portion, pt scored within an adequate range for this assessment.  Pt educated re: overarticulation, pausing and increasing vocal intensity/projecting voice when speaking and given oral/motor exercises and sensory strategies to improve R side oral sensory impairment/weakness.  Speech/swallowing are adequate and min affected by R sided weakness.  Pt does not c/o drooling or anterior loss during meals.  ST not recommended at this time.  Pt is functioning WFL for cognition/language and speech is intelligible within complex conversation.  Thank you for this consult.    SLP Assessment  SLP Recommendation/Assessment: Patient does  not need any further Speech Language Pathology Services SLP Visit Diagnosis: Cognitive communication deficit (R41.841)    Follow Up Recommendations       Frequency and Duration Other (Comment) (evaluation only)         SLP Evaluation Cognition  Overall Cognitive Status: Within Functional Limits for tasks assessed Arousal/Alertness: Awake/alert Orientation Level: Oriented X4 Attention: Sustained Sustained Attention: Appears intact Memory: Appears intact Immediate Memory Recall: Sock;Blue;Bed Memory Recall Sock: Without Cue Memory Recall Blue: Without Cue Memory Recall Bed: Without Cue Awareness: Appears intact Problem Solving: Appears intact Safety/Judgment: Appears intact       Comprehension  Auditory Comprehension Overall Auditory Comprehension: Appears within functional limits for tasks assessed Yes/No Questions: Within Functional Limits Commands: Within Functional Limits Conversation: Complex Visual Recognition/Discrimination Discrimination: Within Function Limits Reading Comprehension Reading Status: Within funtional limits    Expression Expression Primary Mode of Expression: Verbal Verbal Expression Overall Verbal Expression: Appears within functional limits for tasks assessed Initiation: No impairment Level of Generative/Spontaneous Verbalization: Conversation Repetition: No impairment Naming: No impairment Pragmatics: No impairment Non-Verbal Means of Communication: Not applicable Written Expression Dominant Hand: Right Written Expression: Not tested (dominant hand affected R)   Oral / Motor  Oral Motor/Sensory Function Overall Oral Motor/Sensory Function: Mild impairment (slight at rest R) Facial ROM: Reduced right Facial Symmetry: Abnormal symmetry right (slight at rest) Facial Strength: Within Functional Limits Facial Sensation: Reduced right Lingual ROM: Within Functional Limits Lingual Symmetry: Abnormal symmetry right (slight, adequate for  speech/swallowing) Lingual Strength: Within Functional Limits Lingual Sensation: Within Functional Limits Velum: Within Functional  Limits Mandible: Within Functional Limits Motor Speech Overall Motor Speech: Appears within functional limits for tasks assessed Respiration: Within functional limits Phonation: Normal Resonance: Within functional limits Articulation: Within functional limitis Intelligibility: Intelligible Motor Planning: Witnin functional limits Motor Speech Errors: Not applicable                       Tressie Stalker, M.S., CCC-SLP 09/29/2020, 2:23 PM

## 2020-09-30 LAB — GLUCOSE, CAPILLARY
Glucose-Capillary: 251 mg/dL — ABNORMAL HIGH (ref 70–99)
Glucose-Capillary: 254 mg/dL — ABNORMAL HIGH (ref 70–99)
Glucose-Capillary: 218 mg/dL — ABNORMAL HIGH (ref 70–99)
Glucose-Capillary: 115 mg/dL — ABNORMAL HIGH (ref 70–99)
Glucose-Capillary: 227 mg/dL — ABNORMAL HIGH (ref 70–99)

## 2020-09-30 MED ORDER — INSULIN GLARGINE 100 UNIT/ML ~~LOC~~ SOLN
60.0000 [IU] | Freq: Every day | SUBCUTANEOUS | Status: DC
  Administered 2020-09-30 – 2020-10-01 (×2): 60 [IU] via SUBCUTANEOUS
  Filled 2020-09-30 (×2): qty 0.6

## 2020-09-30 NOTE — Progress Notes (Signed)
Inpatient Rehab Admissions Coordinator:   I opened a case with Pt.'s insurance but do not yet have auth for  CIR. I will pursue for potential admit pending bed availability and insurance auth.   Megan Salon, MS, CCC-SLP Rehab Admissions Coordinator  [phone] (celll) [phone] (office)

## 2020-09-30 NOTE — Progress Notes (Signed)
Physical Therapy Treatment Patient Details Name: Amber [name] MRN: [id number] DOB: 1970/07/29 Today's Date: 09/30/2020    History of Present Illness 50 yo female who presented on 09/24/20 with R sided weakness that began on 09/22/20 with multiple falls and episode of laying in floor for 20 hrs and then 8 hrs.  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   Pt with hx of HTN and depression.    PT Comments    Pt sitting EOB waiting to go to bathroom upon PT arrival to room. Pt overall progressing well, participating in gait training for hallway distance with min assist to steady. Pt with RLE incoordination, requiring mod verbal and tactile cuing to improve. Will continue to follow.    Follow Up Recommendations  CIR     Equipment Recommendations  Wheelchair cushion (measurements PT);Wheelchair (measurements PT);3in1 (PT);Rolling walker with 5" wheels    Recommendations for Other Services       Precautions / Restrictions Precautions Precautions: Fall Restrictions Weight Bearing Restrictions: No    Mobility  Bed Mobility Overal bed mobility: Needs Assistance         Sit to supine: Min assist;HOB elevated   General bed mobility comments: sitting EOB upon PT arrival to room, min assist for repositioning and LE lifitng sit>supine.    Transfers Overall transfer level: Needs assistance Equipment used: Rolling walker (2 wheeled) Transfers: Sit to/from Stand Sit to Stand: Min assist         General transfer comment: min assist for initial power up and steadying upon standing, VC for hand placement when rising/sitting. STS x2, from EOB and toilet.  Ambulation/Gait Ambulation/Gait assistance: Min assist;Min guard Gait Distance (Feet): 45 Feet Assistive device: Rolling walker (2 wheeled) Gait Pattern/deviations: Decreased stride length;Decreased weight shift to right;Step-through pattern;Ataxic;Decreased step length - right Gait  velocity: decr   General Gait Details: close guard for safety, intermittent min assist to steady, guard RLE, support trunk. Verbal cuing for placement in RW, increasing step length and foot clearance RLE   Stairs             Wheelchair Mobility    Modified Rankin (Stroke Patients Only) Modified Rankin (Stroke Patients Only) Pre-Morbid Rankin Score: No symptoms Modified Rankin: Moderately severe disability     Balance Overall balance assessment: Needs assistance Sitting-balance support: Feet supported;No upper extremity supported Sitting balance-Leahy Scale: Good   Postural control: Left lateral lean Standing balance support: Single extremity supported;During functional activity Standing balance-Leahy Scale: Poor Standing balance comment: reliant on device                            Cognition Arousal/Alertness: Awake/alert Behavior During Therapy: WFL for tasks assessed/performed Overall Cognitive Status: Within Functional Limits for tasks assessed                                        Exercises      General Comments        Pertinent Vitals/Pain Pain Assessment: Faces Faces Pain Scale: Hurts a little bit Pain Location: RUE, IV removal Pain Descriptors / Indicators: Discomfort;Sore Pain Intervention(s): Limited activity within patient's tolerance;Monitored during session;Repositioned    Home Living                      Prior Function  PT Goals (current goals can now be found in the care plan section) Acute Rehab PT Goals Patient Stated Goal: to use my hand and walk PT Goal Formulation: With patient Time For Goal Achievement: 10/09/20 Potential to Achieve Goals: Good Additional Goals Additional Goal #1: Will increase R leg strength to 5/5 for gait and stairs Progress towards PT goals: Progressing toward goals    Frequency    Min 4X/week      PT Plan Current plan remains appropriate     Co-evaluation              AM-PAC PT "6 Clicks" Mobility   Outcome Measure  Help needed turning from your back to your side while in a flat bed without using bedrails?: A Little Help needed moving from lying on your back to sitting on the side of a flat bed without using bedrails?: A Little Help needed moving to and from a bed to a chair (including a wheelchair)?: A Little Help needed standing up from a chair using your arms (e.g., wheelchair or bedside chair)?: A Little Help needed to walk in hospital room?: A Little Help needed climbing 3-5 steps with a railing? : A Lot 6 Click Score: 17    End of Session Equipment Utilized During Treatment: Gait belt Activity Tolerance: Patient tolerated treatment well Patient left: in bed;with call bell/phone within reach;with bed alarm set Nurse Communication: Mobility status PT Visit Diagnosis: Unsteadiness on feet (R26.81);Muscle weakness (generalized) (M62.81);Hemiplegia and hemiparesis Hemiplegia - Right/Left: Right Hemiplegia - dominant/non-dominant: Dominant Hemiplegia - caused by: Cerebral infarction     Time: 1308-6578 PT Time Calculation (min) (ACUTE ONLY): 17 min  Charges:  $Gait Training: 8-22 mins                     Marye Round, PT DPT Acute Rehabilitation Services Pager [phone]  Office [phone]    Tyrone Apple E Christain [place] 09/30/2020, 10:37 AM

## 2020-09-30 NOTE — Progress Notes (Signed)
[facility] PROGRESS NOTE    Amber [name]  ZOX:[id number] DOB: 26-Mar-1970 DOA: 09/24/2020 PCP: Pcp, No      Brief Narrative:  Amber [name] is a 50 y.o. F with HTN, DM, who presented with right-sided weakness.  2 days prior to admission, the patient had sudden onset right hemiparesis and right facial droop.  She did not want to inconvenience anyone to drive her to the ER, so she waited until 7/6 to present.  In the ER, CT head suggested new stroke.  Neurology were consulted.         Assessment & Plan:  Acute stroke See summary from 7/11  -Continue atorvastatin - Continue aspirin and Plavix    Diabetes See summary from 7/11 -Continue Lantus, SSI, glipizide   Phlebitis Improving - Continue cephalexin  Anxiety - Continue sertraline  Hypertension Blood pressure controlled - Continue HCTZ      Disposition: Status is: Inpatient  Remains inpatient appropriate because: she has dense right hemiparesis and will require residential rehabilitation either inpatient rehab or SNF  Dispo: The patient is from: Home              Anticipated d/c is to:  TBD              Patient currently is not medically stable to d/c.   Difficult to place patient No           Level of care: Med-Surg       MDM: The below labs and imaging reports were reviewed and summarized above.  Medication management as above.   DVT prophylaxis: SCDs Start: 09/24/20 1825  Code Status: FULL Family Communication: Fiance at the bedside.             Subjective: The redness on her right antecubital area is improved and getting smaller.  No fever.  Her strength seems to be improving slightly although she is still considerably weak and discoordinated.     Objective: Vitals:   09/30/20 0404 09/30/20 0750 09/30/20 1128 09/30/20 1623  BP: 139/72 110/87 129/71 (!) 146/82  Pulse: 74 72 88 83  Resp: 18 18 18 18   Temp: 98.1 F (36.7 C) 98.1 F (36.7 C) 98.4 F  (36.9 C) 98.3 F (36.8 C)  TempSrc: Oral Oral Oral   SpO2: 94% 95% 95% 99%  Weight:      Height:        Intake/Output Summary (Last 24 hours) at 09/30/2020 1813 Last data filed at 09/30/2020 0600 Gross per 24 hour  Intake 435 ml  Output 550 ml  Net -115 ml   Filed Weights   09/26/20 0500 09/28/20 0500 09/29/20 0500  Weight: 123.5 kg 125.7 kg 123.9 kg    Examination: General appearance: Adult female, lying in bed, no acute distress    HEENT:   Skin: Skin in the right antecubital area has less redness and swelling than before, is no longer tender Cardiac:   Respiratory:   Abdomen:   MSK:  Neuro:   Psych: Sensorium intact and responding to questions, attention normal, affect normal, judgment insight appear normal            Data Reviewed: I have personally reviewed following labs and imaging studies:  CBC: Recent Labs  Lab 09/24/20 1628 09/24/20 1634 09/25/20 0347  WBC 10.7*  --  8.8  NEUTROABS 7.2  --   --   HGB 15.2* 16.0* 13.5  HCT 46.2* 47.0* 42.9  MCV 78.6*  --  81.1  PLT 247  --  239   Basic Metabolic Panel: Recent Labs  Lab 09/24/20 1628 09/24/20 1634 09/25/20 0347 09/27/20 1500  NA 133* 135 135 136  K 4.0 4.1 3.8 4.0  CL 99 101 105 102  CO2 22  --  22 25  GLUCOSE 310* 314* 351* 200*  BUN 13 13 9 14   CREATININE 0.59 0.40* 0.54 0.57  CALCIUM 9.1  --  8.6* 9.4  MG  --   --  1.8 1.6*   GFR: Estimated Creatinine Clearance: 117.8 mL/min (by C-G formula based on SCr of 0.57 mg/dL). Liver Function Tests: Recent Labs  Lab 09/24/20 1628 09/25/20 0347  AST 14* 12*  ALT 19 17  ALKPHOS 66 64  BILITOT 1.5* 1.1  PROT 6.5 6.0*  ALBUMIN 3.7 3.3*   No results for input(s): LIPASE, AMYLASE in the last 168 hours. No results for input(s): AMMONIA in the last 168 hours. Coagulation Profile: Recent Labs  Lab 09/24/20 1628  INR 1.0   Cardiac Enzymes: Recent Labs  Lab 09/24/20 1628  CKTOTAL 112   BNP (last 3 results) No results for  input(s): PROBNP in the last 8760 hours. HbA1C: No results for input(s): HGBA1C in the last 72 hours.  CBG: Recent Labs  Lab 09/29/20 2104 09/30/20 0627 09/30/20 0853 09/30/20 1126 09/30/20 1643  GLUCAP 252* 251* 254* 218* 115*   Lipid Profile: No results for input(s): CHOL, HDL, LDLCALC, TRIG, CHOLHDL, LDLDIRECT in the last 72 hours.  Thyroid Function Tests: No results for input(s): TSH, T4TOTAL, FREET4, T3FREE, THYROIDAB in the last 72 hours. Anemia Panel: No results for input(s): VITAMINB12, FOLATE, FERRITIN, TIBC, IRON, RETICCTPCT in the last 72 hours. Urine analysis:    Component Value Date/Time   COLORURINE YELLOW 09/24/2020 1919   APPEARANCEUR HAZY (A) 09/24/2020 1919   LABSPEC >1.046 (H) 09/24/2020 1919   PHURINE 5.0 09/24/2020 1919   GLUCOSEU >=500 (A) 09/24/2020 1919   HGBUR NEGATIVE 09/24/2020 1919   BILIRUBINUR NEGATIVE 09/24/2020 1919   KETONESUR 80 (A) 09/24/2020 1919   PROTEINUR NEGATIVE 09/24/2020 1919   NITRITE POSITIVE (A) 09/24/2020 1919   LEUKOCYTESUR TRACE (A) 09/24/2020 1919   Sepsis Labs: @LABRCNTIP (procalcitonin:4,lacticacidven:4)  ) Recent Results (from the past 240 hour(s))  Resp Panel by RT-PCR (Flu A&B, Covid) Nasopharyngeal Swab     Status: None   Collection Time: 09/24/20  4:28 PM   Specimen: Nasopharyngeal Swab; Nasopharyngeal(NP) swabs in vial transport medium  Result Value Ref Range Status   SARS Coronavirus 2 by RT PCR NEGATIVE NEGATIVE Final    Comment: (NOTE) SARS-CoV-2 target nucleic acids are NOT DETECTED.  The SARS-CoV-2 RNA is generally detectable in upper respiratory specimens during the acute phase of infection. The lowest concentration of SARS-CoV-2 viral copies this assay can detect is 138 copies/mL. A negative result does not preclude SARS-Cov-2 infection and should not be used as the sole basis for treatment or other patient management decisions. A negative result may occur with  improper specimen  collection/handling, submission of specimen other than nasopharyngeal swab, presence of viral mutation(s) within the areas targeted by this assay, and inadequate number of viral copies(<138 copies/mL). A negative result must be combined with clinical observations, patient history, and epidemiological information. The expected result is Negative.  Fact Sheet for Patients:   Fact Sheet for Healthcare Providers:  11/25/20  This test is no t yet approved or cleared by the [employer] FDA and  has been authorized for detection and/or diagnosis of SARS-CoV-2 by FDA  under an Emergency Use Authorization (EUA). This EUA will remain  in effect (meaning this test can be used) for the duration of the COVID-19 declaration under Section 564(b)(1) of the Act, 21 U.S.C.section 360bbb-3(b)(1), unless the authorization is terminated  or revoked sooner.       Influenza A by PCR NEGATIVE NEGATIVE Final   Influenza B by PCR NEGATIVE NEGATIVE Final    Comment: (NOTE) The Xpert Xpress SARS-CoV-2/FLU/RSV plus assay is intended as an aid in the diagnosis of influenza from Nasopharyngeal swab specimens and should not be used as a sole basis for treatment. Nasal washings and aspirates are unacceptable for Xpert Xpress SARS-CoV-2/FLU/RSV testing.  Fact Sheet for Patients: [employer]  Fact Sheet for Healthcare Providers: [employer]it  This test is not yet approved or cleared by the [place] FDA and has been authorized for detection and/or diagnosis of SARS-CoV-2 by FDA under an Emergency Use Authorization (EUA). This EUA will remain in effect (meaning this test can be used) for the duration of the COVID-19 declaration under Section 564(b)(1) of the Act, 21 U.S.C. section 360bbb-3(b)(1), unless the authorization is terminated or revoked.  Performed at [facility], [address] [place], [place] [address]          Radiology Studies: No results found.      Scheduled Meds:  aspirin EC  81 mg Oral Daily   atorvastatin  40 mg Oral QHS   cephALEXin  500 mg Oral Q8H   clopidogrel  75 mg Oral Daily   glipiZIDE  5 mg Oral QAC breakfast   hydrochlorothiazide  12.5 mg Oral Daily   insulin aspart  0-20 Units Subcutaneous TID WC   insulin aspart  0-5 Units Subcutaneous QHS   insulin aspart  8 Units Subcutaneous TID WC   insulin glargine  60 Units Subcutaneous Daily   sertraline  100 mg Oral Daily   Continuous Infusions:      LOS: 5 days    Time spent: 15  minutes    Alberteen Sam, MD [facility] 09/30/2020, 6:13 PM     Please page though AMION or Epic secure chat:  For [employer], Higher education careers adviser

## 2020-10-01 ENCOUNTER — Inpatient Hospital Stay (HOSPITAL_COMMUNITY)
Admission: RE | Admit: 2020-10-01 | Discharge: 2020-10-15 | DRG: 057 | Disposition: A | Discharge status: Home Health | Payer: BC Managed Care – PPO | Source: Intra-hospital | Attending: Physical Medicine & Rehabilitation | Admitting: Physical Medicine & Rehabilitation

## 2020-10-01 ENCOUNTER — Other Ambulatory Visit: Payer: Self-pay

## 2020-10-01 ENCOUNTER — Encounter (HOSPITAL_COMMUNITY): Payer: Self-pay | Admitting: Physical Medicine & Rehabilitation

## 2020-10-01 DIAGNOSIS — F32A Depression, unspecified: Secondary | ICD-10-CM | POA: Diagnosis present

## 2020-10-01 DIAGNOSIS — E119 Type 2 diabetes mellitus without complications: Secondary | ICD-10-CM | POA: Diagnosis present

## 2020-10-01 DIAGNOSIS — I69351 Hemiplegia and hemiparesis following cerebral infarction affecting right dominant side: Principal | ICD-10-CM

## 2020-10-01 DIAGNOSIS — G4730 Sleep apnea, unspecified: Secondary | ICD-10-CM | POA: Diagnosis present

## 2020-10-01 DIAGNOSIS — Z888 Allergy status to other drugs, medicaments and biological substances status: Secondary | ICD-10-CM | POA: Diagnosis not present

## 2020-10-01 DIAGNOSIS — Z6837 Body mass index (BMI) 37.0-37.9, adult: Secondary | ICD-10-CM

## 2020-10-01 DIAGNOSIS — E871 Hypo-osmolality and hyponatremia: Secondary | ICD-10-CM

## 2020-10-01 DIAGNOSIS — E669 Obesity, unspecified: Secondary | ICD-10-CM | POA: Diagnosis present

## 2020-10-01 DIAGNOSIS — Z880 Allergy status to penicillin: Secondary | ICD-10-CM | POA: Diagnosis not present

## 2020-10-01 DIAGNOSIS — I10 Essential (primary) hypertension: Secondary | ICD-10-CM | POA: Diagnosis present

## 2020-10-01 DIAGNOSIS — E785 Hyperlipidemia, unspecified: Secondary | ICD-10-CM | POA: Diagnosis present

## 2020-10-01 DIAGNOSIS — I69392 Facial weakness following cerebral infarction: Secondary | ICD-10-CM

## 2020-10-01 DIAGNOSIS — Z8673 Personal history of transient ischemic attack (TIA), and cerebral infarction without residual deficits: Secondary | ICD-10-CM | POA: Diagnosis present

## 2020-10-01 DIAGNOSIS — Z79899 Other long term (current) drug therapy: Secondary | ICD-10-CM

## 2020-10-01 DIAGNOSIS — I639 Cerebral infarction, unspecified: Secondary | ICD-10-CM

## 2020-10-01 DIAGNOSIS — I808 Phlebitis and thrombophlebitis of other sites: Secondary | ICD-10-CM | POA: Diagnosis present

## 2020-10-01 DIAGNOSIS — E872 Acidosis: Secondary | ICD-10-CM

## 2020-10-01 LAB — CBC WITH DIFFERENTIAL/PLATELET
Abs Immature Granulocytes: 0.07 10*3/uL (ref 0.00–0.07)
Basophils Absolute: 0.1 10*3/uL (ref 0.0–0.1)
Basophils Relative: 1 %
Eosinophils Absolute: 0.2 10*3/uL (ref 0.0–0.5)
Eosinophils Relative: 3 %
HCT: 54.3 % — ABNORMAL HIGH (ref 36.0–46.0)
Hemoglobin: 17.8 g/dL — ABNORMAL HIGH (ref 12.0–15.0)
Immature Granulocytes: 1 %
Lymphocytes Relative: 21 %
Lymphs Abs: 1.7 10*3/uL (ref 0.7–4.0)
MCH: 25.6 pg — ABNORMAL LOW (ref 26.0–34.0)
MCHC: 32.8 g/dL (ref 30.0–36.0)
MCV: 78 fL — ABNORMAL LOW (ref 80.0–100.0)
Monocytes Absolute: 0.6 10*3/uL (ref 0.1–1.0)
Monocytes Relative: 7 %
Neutro Abs: 5.4 10*3/uL (ref 1.7–7.7)
Neutrophils Relative %: 67 %
Platelets: 188 10*3/uL (ref 150–400)
RBC: 6.96 MIL/uL — ABNORMAL HIGH (ref 3.87–5.11)
RDW: 15.9 % — ABNORMAL HIGH (ref 11.5–15.5)
WBC: 8 10*3/uL (ref 4.0–10.5)
nRBC: 0 % (ref 0.0–0.2)

## 2020-10-01 LAB — GLUCOSE, CAPILLARY
Glucose-Capillary: 222 mg/dL — ABNORMAL HIGH (ref 70–99)
Glucose-Capillary: 268 mg/dL — ABNORMAL HIGH (ref 70–99)
Glucose-Capillary: 201 mg/dL — ABNORMAL HIGH (ref 70–99)
Glucose-Capillary: 242 mg/dL — ABNORMAL HIGH (ref 70–99)

## 2020-10-01 LAB — COMPREHENSIVE METABOLIC PANEL
ALT: 15 U/L (ref 0–44)
AST: 17 U/L (ref 15–41)
Albumin: 3.4 g/dL — ABNORMAL LOW (ref 3.5–5.0)
Alkaline Phosphatase: 63 U/L (ref 38–126)
Anion gap: 11 (ref 5–15)
BUN: 16 mg/dL (ref 6–20)
CO2: 21 mmol/L — ABNORMAL LOW (ref 22–32)
Calcium: 9.1 mg/dL (ref 8.9–10.3)
Chloride: 100 mmol/L (ref 98–111)
Creatinine, Ser: 0.54 mg/dL (ref 0.44–1.00)
GFR, Estimated: >60 mL/min (ref >60)
Glucose, Bld: 250 mg/dL — ABNORMAL HIGH (ref 70–99)
Potassium: 4 mmol/L (ref 3.5–5.1)
Sodium: 132 mmol/L — ABNORMAL LOW (ref 135–145)
Total Bilirubin: 0.8 mg/dL (ref 0.3–1.2)
Total Protein: 6.7 g/dL (ref 6.5–8.1)

## 2020-10-01 LAB — MAGNESIUM: Magnesium: 1.6 mg/dL — ABNORMAL LOW (ref 1.7–2.4)

## 2020-10-01 LAB — PHOSPHORUS: Phosphorus: 3.6 mg/dL (ref 2.5–4.6)

## 2020-10-01 MED ORDER — CLOPIDOGREL BISULFATE 75 MG PO TABS
75.0000 mg | ORAL_TABLET | Freq: Every day | ORAL | Status: DC
  Administered 2020-10-02 – 2020-10-15 (×14): 75 mg via ORAL
  Filled 2020-10-01 (×14): qty 1

## 2020-10-01 MED ORDER — INSULIN ASPART 100 UNIT/ML IJ SOLN
8.0000 [IU] | Freq: Three times a day (TID) | INTRAMUSCULAR | Status: DC
  Administered 2020-10-02 – 2020-10-07 (×17): 8 [IU] via SUBCUTANEOUS

## 2020-10-01 MED ORDER — ACETAMINOPHEN 325 MG PO TABS
650.0000 mg | ORAL_TABLET | Freq: Four times a day (QID) | ORAL | Status: DC | PRN
  Administered 2020-10-05 – 2020-10-13 (×5): 650 mg via ORAL
  Filled 2020-10-01 (×7): qty 2

## 2020-10-01 MED ORDER — HYDROCHLOROTHIAZIDE 12.5 MG PO CAPS
12.5000 mg | ORAL_CAPSULE | Freq: Every day | ORAL | Status: DC
  Administered 2020-10-02 – 2020-10-15 (×14): 12.5 mg via ORAL
  Filled 2020-10-01 (×14): qty 1

## 2020-10-01 MED ORDER — CEPHALEXIN 250 MG PO CAPS
500.0000 mg | ORAL_CAPSULE | Freq: Three times a day (TID) | ORAL | Status: AC
  Administered 2020-10-01 – 2020-10-06 (×15): 500 mg via ORAL
  Filled 2020-10-01 (×15): qty 2

## 2020-10-01 MED ORDER — SERTRALINE HCL 100 MG PO TABS
100.0000 mg | ORAL_TABLET | Freq: Every day | ORAL | Status: DC
  Administered 2020-10-02 – 2020-10-15 (×14): 100 mg via ORAL
  Filled 2020-10-01 (×14): qty 1

## 2020-10-01 MED ORDER — FLUCONAZOLE 150 MG PO TABS
150.0000 mg | ORAL_TABLET | Freq: Once | ORAL | Status: AC
  Administered 2020-10-01: 150 mg via ORAL
  Filled 2020-10-01 (×2): qty 1

## 2020-10-01 MED ORDER — CEPHALEXIN 500 MG PO CAPS: 500.0000 mg | ORAL_CAPSULE | Freq: Three times a day (TID) | ORAL | 0 refills | Status: DC

## 2020-10-01 MED ORDER — GLIPIZIDE 5 MG PO TABS: 5.0000 mg | ORAL_TABLET | Freq: Every day | ORAL | 0 refills | Status: DC

## 2020-10-01 MED ORDER — LISINOPRIL-HYDROCHLOROTHIAZIDE 20-12.5 MG PO TABS: 1.0000 | ORAL_TABLET | Freq: Every day | ORAL | Status: DC

## 2020-10-01 MED ORDER — EXERCISE FOR HEART AND HEALTH BOOK
Freq: Once | Status: AC
  Filled 2020-10-01: qty 1

## 2020-10-01 MED ORDER — INSULIN GLARGINE 100 UNIT/ML ~~LOC~~ SOLN
60.0000 [IU] | Freq: Every day | SUBCUTANEOUS | Status: DC
  Administered 2020-10-02: 60 [IU] via SUBCUTANEOUS
  Filled 2020-10-01: qty 0.6

## 2020-10-01 MED ORDER — MICONAZOLE NITRATE POWD: 1.0000 "application " | Freq: Two times a day (BID) | 0 refills | Status: DC

## 2020-10-01 MED ORDER — MICONAZOLE NITRATE POWD: Freq: Two times a day (BID) | Status: DC

## 2020-10-01 MED ORDER — ATORVASTATIN CALCIUM 40 MG PO TABS: 40.0000 mg | ORAL_TABLET | Freq: Every day | ORAL | 0 refills | Status: DC

## 2020-10-01 MED ORDER — ACETAMINOPHEN 650 MG RE SUPP: 650.0000 mg | Freq: Four times a day (QID) | RECTAL | Status: DC | PRN

## 2020-10-01 MED ORDER — INSULIN ASPART 100 UNIT/ML IJ SOLN
0.0000 [IU] | Freq: Three times a day (TID) | INTRAMUSCULAR | Status: DC
Start: 2020-10-02 — End: 2020-10-15
  Administered 2020-10-02: 4 [IU] via SUBCUTANEOUS
  Administered 2020-10-02: 7 [IU] via SUBCUTANEOUS
  Administered 2020-10-02 – 2020-10-03 (×2): 4 [IU] via SUBCUTANEOUS
  Administered 2020-10-03: 3 [IU] via SUBCUTANEOUS
  Administered 2020-10-03 – 2020-10-04 (×2): 4 [IU] via SUBCUTANEOUS
  Administered 2020-10-04 – 2020-10-05 (×3): 3 [IU] via SUBCUTANEOUS
  Administered 2020-10-06 (×3): 4 [IU] via SUBCUTANEOUS
  Administered 2020-10-07: 3 [IU] via SUBCUTANEOUS
  Administered 2020-10-07 – 2020-10-08 (×2): 4 [IU] via SUBCUTANEOUS
  Administered 2020-10-08: 7 [IU] via SUBCUTANEOUS
  Administered 2020-10-08 – 2020-10-09 (×2): 4 [IU] via SUBCUTANEOUS
  Administered 2020-10-09 – 2020-10-10 (×3): 3 [IU] via SUBCUTANEOUS
  Administered 2020-10-11 (×2): 7 [IU] via SUBCUTANEOUS
  Administered 2020-10-12 (×2): 3 [IU] via SUBCUTANEOUS
  Administered 2020-10-12: 4 [IU] via SUBCUTANEOUS
  Administered 2020-10-13: 3 [IU] via SUBCUTANEOUS
  Administered 2020-10-13: 4 [IU] via SUBCUTANEOUS
  Administered 2020-10-13 – 2020-10-15 (×3): 3 [IU] via SUBCUTANEOUS

## 2020-10-01 MED ORDER — ASPIRIN EC 81 MG PO TBEC
81.0000 mg | DELAYED_RELEASE_TABLET | Freq: Every day | ORAL | Status: DC
  Administered 2020-10-02 – 2020-10-15 (×14): 81 mg via ORAL
  Filled 2020-10-01 (×14): qty 1

## 2020-10-01 MED ORDER — ACETAMINOPHEN 325 MG PO TABS: 650.0000 mg | ORAL_TABLET | Freq: Four times a day (QID) | ORAL | 0 refills | Status: DC | PRN

## 2020-10-01 MED ORDER — CLOPIDOGREL BISULFATE 75 MG PO TABS: 75.0000 mg | ORAL_TABLET | Freq: Every day | ORAL | 2 refills | Status: DC

## 2020-10-01 MED ORDER — GLIPIZIDE 5 MG PO TABS
5.0000 mg | ORAL_TABLET | Freq: Every day | ORAL | Status: DC
  Administered 2020-10-02 – 2020-10-15 (×14): 5 mg via ORAL
  Filled 2020-10-01 (×14): qty 1

## 2020-10-01 MED ORDER — INSULIN GLARGINE 100 UNIT/ML ~~LOC~~ SOLN: 60.0000 [IU] | Freq: Every day | SUBCUTANEOUS | 11 refills | Status: DC

## 2020-10-01 MED ORDER — CYCLOBENZAPRINE HCL 5 MG PO TABS: 5.0000 mg | ORAL_TABLET | Freq: Three times a day (TID) | ORAL | 0 refills | Status: DC | PRN

## 2020-10-01 MED ORDER — MAGNESIUM SULFATE 2 GM/50ML IV SOLN
2.0000 g | Freq: Once | INTRAVENOUS | Status: AC
  Administered 2020-10-01: 2 g via INTRAVENOUS
  Filled 2020-10-01: qty 50

## 2020-10-01 MED ORDER — BLOOD PRESSURE CONTROL BOOK
Freq: Once | Status: AC
  Administered 2020-10-01: 1
  Filled 2020-10-01: qty 1

## 2020-10-01 MED ORDER — ATORVASTATIN CALCIUM 40 MG PO TABS
40.0000 mg | ORAL_TABLET | Freq: Every day | ORAL | Status: DC
  Administered 2020-10-01 – 2020-10-14 (×14): 40 mg via ORAL
  Filled 2020-10-01 (×14): qty 1

## 2020-10-01 MED ORDER — ASPIRIN 81 MG PO TBEC: 81.0000 mg | DELAYED_RELEASE_TABLET | Freq: Every day | ORAL | 11 refills | Status: DC

## 2020-10-01 MED ORDER — CYCLOBENZAPRINE HCL 5 MG PO TABS: 5.0000 mg | ORAL_TABLET | Freq: Three times a day (TID) | ORAL | Status: DC | PRN
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
## 2020-10-01 NOTE — H&P (Signed)
Physical Medicine and Rehabilitation Admission H&P        Chief Complaint  Patient presents with   Stroke Symptoms  : HPI: Amber [name] is a 50 year old right-handed female with history of hypertension hyperlipidemia type 2 diabetes mellitus.  Per chart review patient lives in an apartment in mothers basement.  Independent prior to admission.  She has 14 steps down to the living area..  She works full-time as a Engineer, site.  Presented 09/24/2020 after being found down for an extended amount of time with right side weakness as well as facial droop.  Cranial CT scan showed abnormal hypoattenuation within the left pons and left thalamus concerning for age-indeterminate infarct.  CT angiogram head and neck no emergent large vessel occlusion.  Patient did not receive tPA.  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  No hemorrhage or mass-effect.  Echocardiogram with ejection fraction of 60 to 65% grade 2 diastolic dysfunction.  No regional wall motion abnormalities.  Admission chemistries unremarkable except sodium 133, glucose 310, CK1 112 urinalysis positive nitrite.  Presently maintained on aspirin as well as Plavix for CVA prophylaxis x3 months then aspirin alone.  Hospital course patient did develop some right antecubital redness and swelling suspect thrombophlebitis placed on a 7-day course of Keflex.  Maintained on a regular consistency diet.  Therapy evaluations completed due to patient's right side weakness decreased functional mobility was admitted for a comprehensive rehab program.   Review of Systems Constitutional:  Negative for chills and fever. HENT:  Negative for hearing loss.   Eyes:  Negative for blurred vision and double vision. Respiratory:  Negative for cough and shortness of breath.   Cardiovascular:  Negative for chest pain, palpitations and leg swelling. Gastrointestinal:  Positive for constipation. Negative for heartburn, nausea and vomiting. Genitourinary:  Negative  for dysuria, flank pain and hematuria. Musculoskeletal:  Positive for joint pain and myalgias. Skin:  Negative for rash. Neurological:  Positive for weakness. Psychiatric/Behavioral:  Positive for depression.   All other systems reviewed and are negative.     Past Medical History:  Diagnosis Date   Depression     DM2 (diabetes mellitus, type 2) (HCC)     HLD (hyperlipidemia)     HTN (hypertension)      History reviewed. No pertinent surgical history. History reviewed. No pertinent family history. Social History:  has no history on file for tobacco use, alcohol use, and drug use. Allergies:      Allergies  Allergen Reactions   Alpha-Gal Anaphylaxis   Penicillins            Medications Prior to Admission  Medication Sig Dispense Refill   lisinopril-hydrochlorothiazide (ZESTORETIC) 20-12.5 MG tablet Take 1 tablet by mouth 2 (two) times daily.       sertraline (ZOLOFT) 100 MG tablet Take 100 mg by mouth daily.          Drug Regimen Review Drug regimen was reviewed and remains appropriate with no significant issues identified.   Home: Home Living Family/patient expects to be discharged to:: Private residence Living Arrangements: Parent Available Help at Discharge: Family, Available 24 hours/day Type of Home: Apartment Home Access: Stairs to enter, Level entry (level entry to first floor where mother lives; down 14 stairs to pt's living area) Secretary/administrator of Steps: 14 Entrance Stairs-Rails: Right, Left, Can reach both Home Layout: Multi-level, Able to live on main level with bedroom/bathroom (pt lives in basement but could stay on first floor with mother if  needed) Alternate Level Stairs-Rails: Right, Left, Can reach both Bathroom Shower/Tub: Walk-in shower, Tub/shower unit (walkin on main level; tub/shower in pts apartment) Bathroom Toilet: Handicapped height Bathroom Accessibility: Yes Home Equipment: None  Lives With: Other (Comment) (Mother)   Functional  History: Prior Function Level of Independence: Independent Comments: Pt works full time as Engineering geologist Status:  Mobility: Bed Mobility Overal bed mobility: Needs Assistance Bed Mobility: Rolling, Sidelying to Sit Rolling: Min assist Sidelying to sit: Min assist, Mod assist Supine to sit: Min assist Sit to supine: Min assist, HOB elevated General bed mobility comments: sitting EOB upon PT arrival to room, min assist for repositioning and LE lifitng sit>supine. Transfers Overall transfer level: Needs assistance Equipment used: Rolling walker (2 wheeled) Transfers: Sit to/from Stand Sit to Stand: Min assist General transfer comment: min assist for initial power up and steadying upon standing, VC for hand placement when rising/sitting. STS x2, from EOB and toilet. Ambulation/Gait Ambulation/Gait assistance: Min assist, Min guard Gait Distance (Feet): 45 Feet Assistive device: Rolling walker (2 wheeled) Gait Pattern/deviations: Decreased stride length, Decreased weight shift to right, Step-through pattern, Ataxic, Decreased step length - right General Gait Details: close guard for safety, intermittent min assist to steady, guard RLE, support trunk. Verbal cuing for placement in RW, increasing step length and foot clearance RLE Gait velocity: decr   ADL: ADL Overall ADL's : Needs assistance/impaired Eating/Feeding: Set up, Sitting, Bed level Grooming: Wash/dry hands, Wash/dry face, Oral care, Brushing hair, Set up, Sitting Upper Body Bathing: Minimal assistance, Sitting Lower Body Bathing: Moderate assistance, Sit to/from stand Upper Body Dressing : Minimal assistance, Sitting Lower Body Dressing: Moderate assistance, Sit to/from stand Toilet Transfer: Minimal assistance, +2 for physical assistance, +2 for safety/equipment, Stand-pivot, BSC Toileting- Clothing Manipulation and Hygiene: Moderate assistance, Sit to/from stand Functional mobility during ADLs: Minimal  assistance, +2 for safety/equipment, +2 for physical assistance General ADL Comments: focus session on education for RUE exercises, see exercises section.   Cognition: Cognition Overall Cognitive Status: Within Functional Limits for tasks assessed Arousal/Alertness: Awake/alert Orientation Level: Oriented X4 Attention: Sustained Sustained Attention: Appears intact Memory: Appears intact Immediate Memory Recall: Angela Adam, Bed Memory Recall Sock: Without Cue Memory Recall Blue: Without Cue Memory Recall Bed: Without Cue Awareness: Appears intact Problem Solving: Appears intact Safety/Judgment: Appears intact Cognition Arousal/Alertness: Awake/alert Behavior During Therapy: WFL for tasks assessed/performed Overall Cognitive Status: Within Functional Limits for tasks assessed General Comments: Pt was motivated to participate with session. Cues for safety with use of RW and safety/problem solving with mobility at times   Physical Exam: Blood pressure 135/82, pulse 79, temperature 98.3 F (36.8 C), temperature source Oral, resp. rate 16, height 5\' 10"  (1.778 m), weight 123.9 kg, SpO2 95 %. Physical Exam Neurological:    Comments: Patient is alert.  No acute distress.  Makes eye contact with examiner.  Follows simple commands.  Provides name and age with some delay in processing.    General: No acute distress Mood and affect are appropriate Heart: Regular rate and rhythm no rubs murmurs or extra sounds Lungs: Clear to auscultation, breathing unlabored, no rales or wheezes Abdomen: Positive bowel sounds, soft nontender to palpation, nondistended Extremities: No clubbing, cyanosis, or edema Skin: No evidence of breakdown, no evidence of rash Neurologic: Cranial nerves II through XII intact, motor strength is 5/5 in left and 3/5 Right deltoid, bicep, tricep, grip, hip flexor, knee extensors, ankle dorsiflexor and plantar flexor Sensory exam normal sensation to light touch and  proprioception  in bilateral upper and lower extremities Cerebellar exam normal finger to nose to finger as well as heel to shin in left and mild dysmetria Rupper and lower extremities Musculoskeletal: Full range of motion in all 4 extremities. No joint swelling    Lab Results Last 48 Hours        Results for orders placed or performed during the hospital encounter of 09/24/20 (from the past 48 hour(s))  Glucose, capillary     Status: Abnormal    Collection Time: 09/29/20 11:18 AM  Result Value Ref Range    Glucose-Capillary 251 (H) 70 - 99 mg/dL      Comment: Glucose reference range applies only to samples taken after fasting for at least 8 hours.  Glucose, capillary     Status: Abnormal    Collection Time: 09/29/20  5:43 PM  Result Value Ref Range    Glucose-Capillary 192 (H) 70 - 99 mg/dL      Comment: Glucose reference range applies only to samples taken after fasting for at least 8 hours.  Glucose, capillary     Status: Abnormal    Collection Time: 09/29/20  9:04 PM  Result Value Ref Range    Glucose-Capillary 252 (H) 70 - 99 mg/dL      Comment: Glucose reference range applies only to samples taken after fasting for at least 8 hours.  Glucose, capillary     Status: Abnormal    Collection Time: 09/30/20  6:27 AM  Result Value Ref Range    Glucose-Capillary 251 (H) 70 - 99 mg/dL      Comment: Glucose reference range applies only to samples taken after fasting for at least 8 hours.  Glucose, capillary     Status: Abnormal    Collection Time: 09/30/20  8:53 AM  Result Value Ref Range    Glucose-Capillary 254 (H) 70 - 99 mg/dL      Comment: Glucose reference range applies only to samples taken after fasting for at least 8 hours.  Glucose, capillary     Status: Abnormal    Collection Time: 09/30/20 11:26 AM  Result Value Ref Range    Glucose-Capillary 218 (H) 70 - 99 mg/dL      Comment: Glucose reference range applies only to samples taken after fasting for at least 8 hours.   Glucose, capillary     Status: Abnormal    Collection Time: 09/30/20  4:43 PM  Result Value Ref Range    Glucose-Capillary 115 (H) 70 - 99 mg/dL      Comment: Glucose reference range applies only to samples taken after fasting for at least 8 hours.  Glucose, capillary     Status: Abnormal    Collection Time: 09/30/20  8:50 PM  Result Value Ref Range    Glucose-Capillary 227 (H) 70 - 99 mg/dL      Comment: Glucose reference range applies only to samples taken after fasting for at least 8 hours.  Glucose, capillary     Status: Abnormal    Collection Time: 10/01/20  6:38 AM  Result Value Ref Range    Glucose-Capillary 222 (H) 70 - 99 mg/dL      Comment: Glucose reference range applies only to samples taken after fasting for at least 8 hours.  Glucose, capillary     Status: Abnormal    Collection Time: 10/01/20  9:13 AM  Result Value Ref Range    Glucose-Capillary 268 (H) 70 - 99 mg/dL      Comment: Glucose reference range  applies only to samples taken after fasting for at least 8 hours.      Imaging Results (Last 48 hours)  No results found.           Medical Problem List and Plan: 1.  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  Antithrombotics: -DVT/anticoagulation: SCDs             -antiplatelet therapy: Aspirin 81 mg daily and Plavix 75 mg daily x3 months then aspirin alone 3. Pain Management: Flexeril 5 mg 3 times daily as needed, Tylenol as needed 4. Mood: Zoloft 100 mg daily             -antipsychotic agents: N/A 5. Neuropsych: This patient is capable of making decisions on her own behalf. 6. Skin/Wound Care: Routine skin checks 7. Fluids/Electrolytes/Nutrition: Routine in and outs with follow-up chemistries 8.  Permissive hypertension.  Presently on HCTZ 12.5 mg daily.  Patient on Zestoretic 20-12.5 mg twice daily prior to  admission.  Resume as needed 9.  Diabetes mellitus.  Hemoglobin A1c 11.7.  NovoLog 8 units 3 times daily, Lantus insulin 60 units daily, Glucotrol 5 mg daily.  Check blood sugars before meals and at bedtime 10.  Thrombophlebitis.  Complete course of Keflex 11.  Hyperlipidemia.  Lipitor       Mcarthur Rossettianiel J Angiulli, PA-C "I have personally performed a face to face diagnostic evaluation of this patient.  Additionally, I have reviewed and concur with the physician assistant's documentation above." Erick ColaceAndrew E. Sumeet Geter M.D. [facility] Fellow Am Acad of [employer] Med and Rehab Diplomate Am Board of Electrodiagnostic Med Fellow Am Board of Interventional Pain

## 2020-10-01 NOTE — Progress Notes (Signed)
Physical Therapy Treatment Patient Details Name: Amber [name] MRN: [id number] DOB: 1970/03/24 Today's Date: 10/01/2020    History of Present Illness 50 yo female who presented on 09/24/20 with R sided weakness that began on 09/22/20 with multiple falls and episode of laying in floor for 20 hrs and then 8 hrs.  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   Pt with hx of HTN and depression.    PT Comments    Pt very motivated to progress mobility, and eager to use RUE throughout session. Pt ambulatory in hallway with assist to steady and correct R lateral listing in hallway, occasional RLE buckling which pt able to correct. Pt is very body-aware, does require occasional cues to attend to certain aspects of mobility. Pt tolerated repeated sit<>stands and single UE supported standing exercise well, excited about her progress and possible admission to CIR today. PT to continue to follow.    Follow Up Recommendations  CIR     Equipment Recommendations  Wheelchair cushion (measurements PT);Wheelchair (measurements PT);3in1 (PT);Rolling walker with 5" wheels    Recommendations for Other Services       Precautions / Restrictions Precautions Precautions: Fall Restrictions Weight Bearing Restrictions: No    Mobility  Bed Mobility Overal bed mobility: Needs Assistance Bed Mobility: Supine to Sit     Supine to sit: Min guard     General bed mobility comments: for safety, HOB elevated with bedrail use and increased time.    Transfers Overall transfer level: Needs assistance Equipment used: Rolling walker (2 wheeled);None Transfers: Sit to/from Stand Sit to Stand: Min guard;Min assist         General transfer comment: Min guard to stand from EOB with use of bedrail and RW, min assist for rise from [facility] and recliner for initial power up and rise.  Ambulation/Gait Ambulation/Gait assistance: Min assist Gait Distance (Feet): 60  Feet Assistive device: Rolling walker (2 wheeled) Gait Pattern/deviations: Step-through pattern;Decreased stride length;Trunk flexed;Decreased step length - right Gait velocity: decr   General Gait Details: min assist for steadying, correct R lateral drift, occasional truncal steadying due to RLE min buckling (pt-corrected). Verbal cuing for placement in RW, large step with RLE. continued RLE incoordination during swing phase.   Stairs             Wheelchair Mobility    Modified Rankin (Stroke Patients Only) Modified Rankin (Stroke Patients Only) Pre-Morbid Rankin Score: No symptoms Modified Rankin: Moderately severe disability     Balance Overall balance assessment: Needs assistance Sitting-balance support: Feet supported;No upper extremity supported Sitting balance-Leahy Scale: Good   Postural control: Right lateral lean Standing balance support: Single extremity supported;During functional activity Standing balance-Leahy Scale: Poor Standing balance comment: reliant on device, at least SL                            Cognition Arousal/Alertness: Awake/alert Behavior During Therapy: WFL for tasks assessed/performed Overall Cognitive Status: Within Functional Limits for tasks assessed                                 General Comments: Motivated to progress mobility      Exercises Other Exercises Other Exercises: forward and backward stepping to target, bilaterally x10, with SL support LUE    General Comments        Pertinent Vitals/Pain Pain  Assessment: Faces Faces Pain Scale: Hurts a little bit Pain Location: RUE, previous IV site Pain Descriptors / Indicators: Discomfort;Sore Pain Intervention(s): Limited activity within patient's tolerance;Monitored during session;Repositioned    Home Living                      Prior Function            PT Goals (current goals can now be found in the care plan section) Acute Rehab  PT Goals Patient Stated Goal: CIR PT Goal Formulation: With patient Time For Goal Achievement: 10/09/20 Potential to Achieve Goals: Good Additional Goals Additional Goal #1: Will increase R leg strength to 5/5 for gait and stairs Progress towards PT goals: Progressing toward goals    Frequency    Min 4X/week      PT Plan Current plan remains appropriate    Co-evaluation              AM-PAC PT "6 Clicks" Mobility   Outcome Measure  Help needed turning from your back to your side while in a flat bed without using bedrails?: A Little Help needed moving from lying on your back to sitting on the side of a flat bed without using bedrails?: A Little Help needed moving to and from a bed to a chair (including a wheelchair)?: A Little Help needed standing up from a chair using your arms (e.g., wheelchair or bedside chair)?: A Little Help needed to walk in hospital room?: A Little Help needed climbing 3-5 steps with a railing? : A Lot 6 Click Score: 17    End of Session Equipment Utilized During Treatment: Gait belt Activity Tolerance: Patient tolerated treatment well Patient left: with call bell/phone within reach;in chair;with chair alarm set Nurse Communication: Mobility status PT Visit Diagnosis: Unsteadiness on feet (R26.81);Muscle weakness (generalized) (M62.81);Hemiplegia and hemiparesis Hemiplegia - Right/Left: Right Hemiplegia - dominant/non-dominant: Dominant Hemiplegia - caused by: Cerebral infarction     Time: 4163-8453 PT Time Calculation (min) (ACUTE ONLY): 26 min  Charges:  $Gait Training: 23-37 mins                     Marye Round, PT DPT Acute Rehabilitation Services Pager [phone]  Office [phone]    Tanise Russman E Stroup 10/01/2020, 11:08 AM

## 2020-10-01 NOTE — Progress Notes (Signed)
Inpatient Rehab Admissions Coordinator:   I have insurance auth and a bed for this Pt.  On CIR today. MD to see Pt, but pending approval, we can transfer to CIR today.   Megan Salon, MS, CCC-SLP Rehab Admissions Coordinator  [phone] (celll) [phone] (office)

## 2020-10-01 NOTE — Progress Notes (Signed)
Inpatient Rehabilitation Medication Review by a Pharmacist  A complete drug regimen review was completed for this patient to identify any potential clinically significant medication issues.  Clinically significant medication issues were identified:  no  Check AMION for pharmacist assigned to patient if future medication questions/issues arise during this admission.  Pharmacist comments:   Time spent performing this drug regimen review (minutes):  5 minutes   [place] Vannessa Godown 10/01/2020 8:24 PM

## 2020-10-01 NOTE — Progress Notes (Signed)
Patient admitted to room 4w17. Patient transferred by bed. Patient is alert and oriented x4. Patients vitals are stable.Patient education provided. Doree Fudge, LPN

## 2020-10-01 NOTE — Discharge Summary (Signed)
Physician Discharge Summary  Amber [name] BOF:[id number] DOB: Aug 08, 1970 DOA: 09/24/2020  PCP: Pcp, No  Admit date: 09/24/2020 Discharge date: 10/01/2020  Admitted From: Home Disposition: CIR  Recommendations for Outpatient Follow-up:  Follow up with PCP in 1-2 weeks Please obtain BMP/CBC in one week Please follow up on the following pending results:  Home Health: No Equipment/Devices: None  Discharge Condition: Stable CODE STATUS: FULL CODE Diet recommendation: Heart Healthy Carb Modified Diet  Brief/Interim Summary: The patient is a 50 year old obese Caucasian female with a past medical history significant for but not limited to hypertension, diabetes mellitus type 2 which is uncontrolled as well as other comorbidities who presented with right-sided weakness.  According to the patient 2 days prior to her admission she had sudden onset of right-sided hemiparesis and right facial droop.  She did not want to inconvenience anyone to drive her to the ED so she waited until 09/24/2020 to present.  In the ED head CT was done suggesting new stroke and MRI confirmed a left pons infarct.  Neurology was consulted and she was placed on dual antiplatelet therapy.  PT OT evaluated and during the course of her hospitalization she ended up having a phlebitis which she has been treated for with p.o. Keflex.  She was found to have uncontrolled blood sugars which are now being currently managed with insulin.  Neurology recommended dual antiplatelet therapy aspirin and Plavix for 3 months and then just aspirin 81 mg alone indefinitely.  She is currently medically stable to be discharged to  CIR as time  Discharge Diagnoses:  Principal Problem:   Acute ischemic stroke (HCC) Active Problems:   Hyperglycemia   HTN (hypertension)   DM2 (diabetes mellitus, type 2) (HCC)   HLD (hyperlipidemia)   Depression   Stroke [facility])  Acute stroke -Admitted as Inpatient and MRI showed left pons infarct. -Had persistent  marked right sided hemiparesis.  Noninvasive angiography showed likely cause of lesion with significant atherosclerosis and basilar artery feeding pons. -Echocardiogram showed no cardiogenic source, carotid imaging without significant stenosis -Needs outpatient sleep study -Lipids ordered, Continue Atorvastatin 40 mg po Daily  -Aspirin ordered at admission, continue aspirin and Plavix for 3 months, then aspirin 81 mg alone indefinitely - No atrial fibrillation on telemetry, likely atheroembolic from basilar artery - tPA not given because outside the window - Dysphagia screen ordered in the ER - PT evaluation ordered, recommending CIR - Non-smoker    Diabetes Mellitus Type 2; Insulin Dependent  -Glucose elevated still -Hemoglobin A1c 11% Previously was on aspart 40 units twice daily and metformin and had reasonable control.  She recalls prior to that at some point being on a GLP-1 agonist glipizide and having also good control. -Continue Lantus 60 units sq Daily and possibly increase dose further -Continue sliding scale corrections -Continue glipizide - Resume semaglutide lower dose at Discharge with close PCP follow-up -CBGs ranging from 115-268   Thrombophlebitis -Right antecubital redness and pain and swelling today after IV removed.   -Tried hot pack, didn't work.  No more painful and red and swelling, Temp 60F. -Start cephalexin 7 days (Day 3/7)   Anxiety -Continue Home Sertraline   Hypertension -Blood pressure controlled -Continue to Monitor BP per Protocol  -Last BP Was   Asymptomatic bacteriuria -No treatment necessary but on Abx as above for Phelbitis   Hypomagnesemia -Mag Level was 1.6 -Replete with IV Mag Sulfate 2 Grams -Continue to Monitor and Replete as Necessary -Repeat Mag Level in the AM  Hyperlipidemia/Hypertriglyceridemia -Patient had a lipid done which showed a total cholesterol/HDL ratio 7.1, cholesterol 212, HDL 30, LDL 107, triglyceride level 377,  VLDL 75 -C/w Statin as above -Dietary Counseling given  Vaginal Yeast Infection -C/w Miconazole Powder -Given Fluconazole 150 mg x1 -May need to extend treatment pending if she has lack of Improvement  Hyponatremia -Mild -Na+ was 132 -In the setting of HCTZ; Continue to Monitor Closely and if consistently low hold HCTZ -Repeat CMP within 1 week  Erythrocytosis -Likely reactive -Hgb/Hct is now 17.8/54.3  -Continue to monitor and Trend -Repeat CBC within 1 week   Metabolic Acidosis -Mild. CO2 is 21, AG is 11, Chloride Level is 100 -Continue to monitor and Trend and repeat CMP within 1 week    Obesity -Complicates overall Prognosis and Care -Estimated body mass index is 39.19 kg/m as calculated from the following:   Height as of this encounter: 5\' 10"  (1.778 m).   Weight as of this encounter: 123.9 kg. -Weight Loss and Dietary Counseling given    Discharge Instructions  Discharge Instructions     Ambulatory referral to Neurology   Complete by: As directed    Follow up with stroke clinic NP (Jessica Vanschaick or , if both not available, consider Darrol Angel, or A[facility]) at [facility] in about 4 weeks. Thanks.   Call MD for:  difficulty breathing, headache or visual disturbances   Complete by: As directed    Call MD for:  extreme fatigue   Complete by: As directed    Call MD for:  hives   Complete by: As directed    Call MD for:  persistant dizziness or light-headedness   Complete by: As directed    Call MD for:  persistant nausea and vomiting   Complete by: As directed    Call MD for:  redness, tenderness, or signs of infection (pain, swelling, redness, odor or green/yellow discharge around incision site)   Complete by: As directed    Call MD for:  severe uncontrolled pain   Complete by: As directed    Call MD for:  temperature >100.4   Complete by: As directed    Diet - low sodium heart healthy   Complete by: As directed    Diet Carb Modified    Complete by: As directed    Discharge instructions   Complete by: As directed    You were cared for by a hospitalist during your hospital stay. If you have any questions about your discharge medications or the care you received while you were in the hospital after you are discharged, you can call the unit and ask to speak with the hospitalist on call if the hospitalist that took care of you is not available. Once you are discharged, your primary care physician will handle any further medical issues. Please note that NO REFILLS for any discharge medications will be authorized once you are discharged, as it is imperative that you return to your primary care physician (or establish a relationship with a primary care physician if you do not have one) for your aftercare needs so that they can reassess your need for medications and monitor your lab values.  Follow up with PCP within 1-2 weeks. Take all medications as prescribed. If symptoms change or worsen please return to the ED for evaluation   Increase activity slowly   Complete by: As directed       Allergies as of 10/01/2020       Reactions   Alpha-gal  Anaphylaxis   Penicillins         Medication List     TAKE these medications    acetaminophen 325 MG tablet Commonly known as: TYLENOL Take 2 tablets (650 mg total) by mouth every 6 (six) hours as needed for mild pain (or Fever >/= 101).   aspirin 81 MG EC tablet Take 1 tablet (81 mg total) by mouth daily. Swallow whole. Start taking on: October 02, 2020   atorvastatin 40 MG tablet Commonly known as: LIPITOR Take 1 tablet (40 mg total) by mouth at bedtime.   cephALEXin 500 MG capsule Commonly known as: KEFLEX Take 1 capsule (500 mg total) by mouth every 8 (eight) hours.   clopidogrel 75 MG tablet Commonly known as: PLAVIX Take 1 tablet (75 mg total) by mouth daily. Start taking on: October 02, 2020   cyclobenzaprine 5 MG tablet Commonly known as: FLEXERIL Take 1 tablet (5 mg  total) by mouth 3 (three) times daily as needed for muscle spasms.   glipiZIDE 5 MG tablet Commonly known as: GLUCOTROL Take 1 tablet (5 mg total) by mouth daily before breakfast. Start taking on: October 02, 2020   insulin glargine 100 UNIT/ML injection Commonly known as: LANTUS Inject 0.6 mLs (60 Units total) into the skin daily. Start taking on: October 02, 2020   lisinopril-hydrochlorothiazide 20-12.5 MG tablet Commonly known as: ZESTORETIC Take 1 tablet by mouth daily. What changed: when to take this   miconazole nitrate Powd Commonly known as: MICATIN Apply 1 application topically 2 (two) times daily.   sertraline 100 MG tablet Commonly known as: ZOLOFT Take 100 mg by mouth daily.        Follow-up Information     [facility]. Schedule an appointment as soon as possible for a visit in 1 month(s).   Specialty: Neurology Why: stroke clinic Contact information: [address] [place] [address] [phone]               Allergies  Allergen Reactions   Alpha-Gal Anaphylaxis   Penicillins     Consultations: Neurology  Procedures/Studies: MR BRAIN WO CONTRAST  Result Date: 09/24/2020 CLINICAL DATA:  Suspected stroke EXAM: MRI HEAD WITHOUT CONTRAST TECHNIQUE: Multiplanar, multiecho pulse sequences of the brain and surrounding structures were obtained without intravenous contrast. COMPARISON:  None. FINDINGS: Brain: There is a 10 mm acute infarct within the left pons. No acute or chronic hemorrhage. Normal white matter signal, parenchymal volume and CSF spaces. The midline structures are normal. Vascular: Major flow voids are preserved. Skull and upper cervical spine: Normal calvarium and skull base. Visualized upper cervical spine and soft tissues are normal. Sinuses/Orbits:No paranasal sinus fluid levels or advanced mucosal thickening. No mastoid or middle ear effusion. Normal orbits. IMPRESSION: 10 mm acute infarct of the  left pons. No hemorrhage or mass effect. Electronically Signed   By: Deatra Robinson M.D.   On: 09/24/2020 23:00   ECHOCARDIOGRAM COMPLETE BUBBLE STUDY  Result Date: 09/25/2020    ECHOCARDIOGRAM REPORT   Patient Name:   AYJAH SHOW Date of Exam: 09/25/2020 Medical Rec #:  [id number]      Height:       67.0 in Accession #:    [id number]     Weight:       260.0 lb Date of Birth:  [birth year]       BSA:          2.261 m Patient Age:    61 years  BP:           143/41 mmHg Patient Gender: F              HR:           84 bpm. Exam Location:  Inpatient Procedure: 2D Echo, Cardiac Doppler, Color Doppler and Saline Contrast Bubble            Study Indications:    Stroke I63.9  History:        Patient has no prior history of Echocardiogram examinations.                 Risk Factors:Hypertension, Diabetes and Dyslipidemia.  Sonographer:    Tiffany Dance Referring Phys: [id number] Angie FavaJUSTIN B HOWERTER IMPRESSIONS  1. Left ventricular ejection fraction, by estimation, is 60 to 65%. The left ventricle has normal function. The left ventricle has no regional wall motion abnormalities. Left ventricular diastolic parameters are consistent with Grade II diastolic dysfunction (pseudonormalization).  2. Right ventricular systolic function is normal. The right ventricular size is normal. Tricuspid regurgitation signal is inadequate for assessing PA pressure.  3. Left atrial size was mildly dilated.  4. The mitral valve is normal in structure. No evidence of mitral valve regurgitation. No evidence of mitral stenosis.  5. The aortic valve is tricuspid. Aortic valve regurgitation is not visualized. No aortic stenosis is present.  6. The inferior vena cava is normal in size with greater than 50% respiratory variability, suggesting right atrial pressure of 3 mmHg.  7. Bubble study negative. FINDINGS  Left Ventricle: Left ventricular ejection fraction, by estimation, is 60 to 65%. The left ventricle has normal function. The left ventricle has no  regional wall motion abnormalities. The left ventricular internal cavity size was normal in size. There is  no left ventricular hypertrophy. Left ventricular diastolic parameters are consistent with Grade II diastolic dysfunction (pseudonormalization). Right Ventricle: The right ventricular size is normal. No increase in right ventricular wall thickness. Right ventricular systolic function is normal. Tricuspid regurgitation signal is inadequate for assessing PA pressure. Left Atrium: Left atrial size was mildly dilated. Right Atrium: Right atrial size was normal in size. Pericardium: There is no evidence of pericardial effusion. Mitral Valve: The mitral valve is normal in structure. No evidence of mitral valve regurgitation. No evidence of mitral valve stenosis. Tricuspid Valve: The tricuspid valve is normal in structure. Tricuspid valve regurgitation is trivial. Aortic Valve: The aortic valve is tricuspid. Aortic valve regurgitation is not visualized. No aortic stenosis is present. Pulmonic Valve: The pulmonic valve was normal in structure. Pulmonic valve regurgitation is not visualized. Aorta: The aortic root is normal in size and structure. Venous: The inferior vena cava is normal in size with greater than 50% respiratory variability, suggesting right atrial pressure of 3 mmHg. IAS/Shunts: Bubble study negative. Agitated saline contrast was given intravenously to evaluate for intracardiac shunting.  LEFT VENTRICLE PLAX 2D LVIDd:         4.80 cm  Diastology LVIDs:         3.80 cm  LV e' medial:    6.34 cm/s LV PW:         1.30 cm  LV E/e' medial:  14.2 LV IVS:        0.90 cm  LV e' lateral:   9.88 cm/s LVOT diam:     2.00 cm  LV E/e' lateral: 9.1 LV SV:         91 LV SV Index:   40 LVOT Area:  3.14 cm  RIGHT VENTRICLE             IVC RV Basal diam:  2.60 cm     IVC diam: 1.25 cm RV S prime:     14.50 cm/s TAPSE (M-mode): 2.5 cm LEFT ATRIUM             Index       RIGHT ATRIUM           Index LA diam:         3.90 cm 1.73 cm/m  RA Area:     11.00 cm LA Vol (A2C):   68.6 ml 30.35 ml/m RA Volume:   20.60 ml  9.11 ml/m LA Vol (A4C):   34.6 ml 15.31 ml/m LA Biplane Vol: 49.1 ml 21.72 ml/m  AORTIC VALVE LVOT Vmax:   126.00 cm/s LVOT Vmean:  93.800 cm/s LVOT VTI:    0.289 m  AORTA Ao Root diam: 3.00 cm Ao Asc diam:  2.80 cm MITRAL VALVE MV Area (PHT): 3.91 cm    SHUNTS MV Decel Time: 194 msec    Systemic VTI:  0.29 m MV E velocity: 90.30 cm/s  Systemic Diam: 2.00 cm MV A velocity: 64.60 cm/s MV E/A ratio:  1.40 Marca Ancona MD Electronically signed by Marca Ancona MD Signature Date/Time: 09/25/2020/3:58:15 PM    Final    CT HEAD CODE STROKE WO CONTRAST  Result Date: 09/24/2020 CLINICAL DATA:  Code stroke.  Right-sided weakness. EXAM: CT HEAD WITHOUT CONTRAST TECHNIQUE: Contiguous axial images were obtained from the base of the skull through the vertex without intravenous contrast. COMPARISON:  None. FINDINGS: Brain: Abnormal hypoattenuation within the left pons and left thalamus, concerning for age indeterminate infarcts. No significant mass effect. No acute hemorrhage, hydrocephalus, midline shift, extra-axial fluid collection. Vascular: No hyperdense vessel identified. Calcific atherosclerosis. Skull: No acute fracture. Sinuses/Orbits: Mucosal thickening of a posterior right ethmoid air cell. Unremarkable orbits. Other: No mastoid effusions. IMPRESSION: Abnormal hypoattenuation within the left pons and left thalamus, concerning for age indeterminate infarcts that could be acute/subacute. Recommend MRI to further evaluate. Code stroke imaging results were communicated on 09/24/2020 at 4:36 pm to provider Dr. Amada Jupiter via telephone, who verbally acknowledged these results. Electronically Signed   By: Feliberto Harts MD   On: 09/24/2020 16:39   CT ANGIO HEAD NECK W WO CM (CODE STROKE)  Result Date: 09/24/2020 CLINICAL DATA:  Right-sided weakness. EXAM: CT ANGIOGRAPHY HEAD AND NECK TECHNIQUE: Multidetector CT  imaging of the head and neck was performed using the standard protocol during bolus administration of intravenous contrast. Multiplanar CT image reconstructions and MIPs were obtained to evaluate the vascular anatomy. Carotid stenosis measurements (when applicable) are obtained utilizing NASCET criteria, using the distal internal carotid diameter as the denominator. CONTRAST:  75mL OMNIPAQUE IOHEXOL 350 MG/ML SOLN COMPARISON:  None. FINDINGS: CTA NECK FINDINGS Aortic arch: Normal variant 4 vessel aortic arch with the left vertebral artery arising from the arch. Widely patent brachiocephalic and subclavian arteries. Right carotid system: Patent with a small amount of calcified plaque in the carotid bulb. No evidence of a significant stenosis or dissection. Left carotid system: Patent with a small to moderate amount of calcified and soft plaque at the carotid bifurcation. No evidence of a significant stenosis or dissection. Vertebral arteries: Patent and small bilaterally with the left being particularly hypoplastic. No evidence of a significant stenosis or dissection. Skeleton: Mild-to-moderate disc and moderate facet degeneration in the cervical spine. Other neck: No evidence of cervical lymphadenopathy  or mass. Upper chest: Clear lung apices. Review of the MIP images confirms the above findings CTA HEAD FINDINGS Anterior circulation: The internal carotid arteries are patent from skull base to carotid termini with mild atherosclerotic irregularity but no significant stenosis. ACAs and MCAs are patent with mild-to-moderate branch vessel irregularity but no evidence of a proximal branch occlusion or significant proximal stenosis. No aneurysm is identified. Posterior circulation: The intracranial vertebral arteries are patent with the left ending in PICA. The basilar artery is patent and congenitally small in caliber with diffuse irregularity as well as a moderate focal stenosis in its midportion. There are fetal  origins of both PCAs. Both PCAs are patent with diffuse irregularity. There is a severe proximal right P2 stenosis. No aneurysm is identified. Venous sinuses: Patent. Anatomic variants: Fetal PCAs. Review of the MIP images confirms the above findings IMPRESSION: 1. No emergent large vessel occlusion. 2. 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 3. Cervical carotid atherosclerosis without stenosis. These results were communicated to Dr. Amada Jupiter at 4:57 pm on 09/24/2020 by text page via the [facility] messaging system. Electronically Signed   By: Sebastian Ache M.D.   On: 09/24/2020 17:06     Subjective: Seen and examined and was working with a therapist and complaining of some vaginal yeast infection.  No nausea or vomiting.  Felt okay and happy that she is going to rehab.  No other concerns or complaints at this time.  Discharge Exam: Vitals:   10/01/20 0341 10/01/20 0751  BP: 133/82 135/82  Pulse: 70 79  Resp: 16 16  Temp: 97.6 F (36.4 C) 98.3 F (36.8 C)  SpO2: 96% 95%   Vitals:   09/30/20 2007 10/01/20 0001 10/01/20 0341 10/01/20 0751  BP: 130/73 123/77 133/82 135/82  Pulse: 86 72 70 79  Resp: Temp: 98.7 F (37.1 C) (!) 97.5 F (36.4 C) 97.6 F (36.4 C) 98.3 F (36.8 C)  TempSrc: Oral Oral Oral Oral  SpO2: 97% 96% 96% 95%  Weight:      Height:       General: Pt is alert, awake, not in acute distress Cardiovascular: RRR, S1/S2 +, no rubs, no gallops Respiratory: Diminished bilaterally, no wheezing, no rhonchi; unlabored breathing Abdominal: Soft, NT, distended secondary body habitus, bowel sounds + Extremities: Minimal edema, no cyanosis  The results of significant diagnostics from this hospitalization (including imaging, microbiology, ancillary and laboratory) are listed below for reference.    Microbiology: Recent Results (from the past 240 hour(s))  Resp Panel by RT-PCR (Flu A&B, Covid) Nasopharyngeal  Swab     Status: None   Collection Time: 09/24/20  4:28 PM   Specimen: Nasopharyngeal Swab; Nasopharyngeal(NP) swabs in vial transport medium  Result Value Ref Range Status   SARS Coronavirus 2 by RT PCR NEGATIVE NEGATIVE Final    Comment: (NOTE) SARS-CoV-2 target nucleic acids are NOT DETECTED.  The SARS-CoV-2 RNA is generally detectable in upper respiratory specimens during the acute phase of infection. The lowest concentration of SARS-CoV-2 viral copies this assay can detect is 138 copies/mL. A negative result does not preclude SARS-Cov-2 infection and should not be used as the sole basis for treatment or other patient management decisions. A negative result may occur with  improper specimen collection/handling, submission of specimen other than nasopharyngeal swab, presence of viral mutation(s) within the areas targeted by this assay, and inadequate number of viral copies(<138 copies/mL). A negative result must be combined with clinical  observations, patient history, and epidemiological information. The expected result is Negative.  Fact Sheet for Patients:  BloggerCourse.com  Fact Sheet for Healthcare Providers:  SeriousBroker.it  This test is no t yet approved or cleared by the [place] FDA and  has been authorized for detection and/or diagnosis of SARS-CoV-2 by FDA under an Emergency Use Authorization (EUA). This EUA will remain  in effect (meaning this test can be used) for the duration of the COVID-19 declaration under Section 564(b)(1) of the Act, 21 U.S.C.section 360bbb-3(b)(1), unless the authorization is terminated  or revoked sooner.       Influenza A by PCR NEGATIVE NEGATIVE Final   Influenza B by PCR NEGATIVE NEGATIVE Final    Comment: (NOTE) The Xpert Xpress SARS-CoV-2/FLU/RSV plus assay is intended as an aid in the diagnosis of influenza from Nasopharyngeal swab specimens and should not be used as a sole  basis for treatment. Nasal washings and aspirates are unacceptable for Xpert Xpress SARS-CoV-2/FLU/RSV testing.  Fact Sheet for Patients: BloggerCourse.com  Fact Sheet for Healthcare Providers: SeriousBroker.it  This test is not yet approved or cleared by the [place] FDA and has been authorized for detection and/or diagnosis of SARS-CoV-2 by FDA under an Emergency Use Authorization (EUA). This EUA will remain in effect (meaning this test can be used) for the duration of the COVID-19 declaration under Section 564(b)(1) of the Act, 21 U.S.C. section 360bbb-3(b)(1), unless the authorization is terminated or revoked.  Performed at [facility], [address] [place], [place] [address]     Labs: BNP (last 3 results) No results for input(s): BNP in the last 8760 hours. Basic Metabolic Panel: Recent Labs  Lab 09/24/20 1628 09/24/20 1634 09/25/20 0347 09/27/20 1500 10/01/20 0908  NA 133* 135 135 136 132*  K 4.0 4.1 3.8 4.0 4.0  CL 99 101 105 102 100  CO2 22  --  22 25 21*  GLUCOSE 310* 314* 351* 200* 250*  BUN CREATININE 0.59 0.40* 0.54 0.57 0.54  CALCIUM 9.1  --  8.6* 9.4 9.1  MG  --   --  1.8 1.6* 1.6*  PHOS  --   --   --   --  3.6   Liver Function Tests: Recent Labs  Lab 09/24/20 1628 09/25/20 0347 10/01/20 0908  AST 14* 12* 17  ALT ALKPHOS 66 64 63  BILITOT 1.5* 1.1 0.8  PROT 6.5 6.0* 6.7  ALBUMIN 3.7 3.3* 3.4*   No results for input(s): LIPASE, AMYLASE in the last 168 hours. No results for input(s): AMMONIA in the last 168 hours. CBC: Recent Labs  Lab 09/24/20 1628 09/24/20 1634 09/25/20 0347 10/01/20 0908  WBC 10.7*  --  8.8 8.0  NEUTROABS 7.2  --   --  5.4  HGB 15.2* 16.0* 13.5 17.8*  HCT 46.2* 47.0* 42.9 54.3*  MCV 78.6*  --  81.1 78.0*  PLT 247  --  239 188   Cardiac Enzymes: Recent Labs  Lab 09/24/20 1628  CKTOTAL 112   BNP: Invalid input(s):  POCBNP CBG: Recent Labs  Lab 09/30/20 1126 09/30/20 1643 09/30/20 2050 10/01/20 0638 10/01/20 0913  GLUCAP 218* 115* 227* 222* 268*   D-Dimer No results for input(s): DDIMER in the last 72 hours. Hgb A1c No results for input(s): HGBA1C in the last 72 hours. Lipid Profile No results for input(s): CHOL, HDL, LDLCALC, TRIG, CHOLHDL, LDLDIRECT in the last 72 hours. Thyroid function studies No  results for input(s): TSH, T4TOTAL, T3FREE, THYROIDAB in the last 72 hours.  Invalid input(s): FREET3 Anemia work up No results for input(s): VITAMINB12, FOLATE, FERRITIN, TIBC, IRON, RETICCTPCT in the last 72 hours. Urinalysis    Component Value Date/Time   COLORURINE YELLOW 09/24/2020 1919   APPEARANCEUR HAZY (A) 09/24/2020 1919   LABSPEC >1.046 (H) 09/24/2020 1919   PHURINE 5.0 09/24/2020 1919   GLUCOSEU >=500 (A) 09/24/2020 1919   HGBUR NEGATIVE 09/24/2020 1919   BILIRUBINUR NEGATIVE 09/24/2020 1919   KETONESUR 80 (A) 09/24/2020 1919   PROTEINUR NEGATIVE 09/24/2020 1919   NITRITE POSITIVE (A) 09/24/2020 1919   LEUKOCYTESUR TRACE (A) 09/24/2020 1919   [facility] Invalid input(s): PROCALCITONIN,  WBC,  LACTICIDVEN Microbiology Recent Results (from the past 240 hour(s))  Resp Panel by RT-PCR (Flu A&B, Covid) Nasopharyngeal Swab     Status: None   Collection Time: 09/24/20  4:28 PM   Specimen: Nasopharyngeal Swab; Nasopharyngeal(NP) swabs in vial transport medium  Result Value Ref Range Status   SARS Coronavirus 2 by RT PCR NEGATIVE NEGATIVE Final    Comment: (NOTE) SARS-CoV-2 target nucleic acids are NOT DETECTED.  The SARS-CoV-2 RNA is generally detectable in upper respiratory specimens during the acute phase of infection. The lowest concentration of SARS-CoV-2 viral copies this assay can detect is 138 copies/mL. A negative result does not preclude SARS-Cov-2 infection and should not be used as the sole basis for treatment or other patient management decisions. A negative  result may occur with  improper specimen collection/handling, submission of specimen other than nasopharyngeal swab, presence of viral mutation(s) within the areas targeted by this assay, and inadequate number of viral copies(<138 copies/mL). A negative result must be combined with clinical observations, patient history, and epidemiological information. The expected result is Negative.  Fact Sheet for Patients:  BloggerCourse.com  Fact Sheet for Healthcare Providers:  SeriousBroker.it  This test is no t yet approved or cleared by the [place] FDA and  has been authorized for detection and/or diagnosis of SARS-CoV-2 by FDA under an Emergency Use Authorization (EUA). This EUA will remain  in effect (meaning this test can be used) for the duration of the COVID-19 declaration under Section 564(b)(1) of the Act, 21 U.S.C.section 360bbb-3(b)(1), unless the authorization is terminated  or revoked sooner.       Influenza A by PCR NEGATIVE NEGATIVE Final   Influenza B by PCR NEGATIVE NEGATIVE Final    Comment: (NOTE) The Xpert Xpress SARS-CoV-2/FLU/RSV plus assay is intended as an aid in the diagnosis of influenza from Nasopharyngeal swab specimens and should not be used as a sole basis for treatment. Nasal washings and aspirates are unacceptable for Xpert Xpress SARS-CoV-2/FLU/RSV testing.  Fact Sheet for Patients: BloggerCourse.com  Fact Sheet for Healthcare Providers: SeriousBroker.it  This test is not yet approved or cleared by the [place] FDA and has been authorized for detection and/or diagnosis of SARS-CoV-2 by FDA under an Emergency Use Authorization (EUA). This EUA will remain in effect (meaning this test can be used) for the duration of the COVID-19 declaration under Section 564(b)(1) of the Act, 21 U.S.C. section 360bbb-3(b)(1), unless the authorization is  terminated or revoked.  Performed at [facility], [address], [place], [place] [address]    Time coordinating discharge: 35 minutes  SIGNED:  Merlene Laughter, DO [facility] 10/01/2020, 11:22 AM Pager is on AMION  If 7PM-7AM, please contact night-coverage [email or web address]

## 2020-10-02 LAB — COMPREHENSIVE METABOLIC PANEL
ALT: 15 U/L (ref 0–44)
AST: 13 U/L — ABNORMAL LOW (ref 15–41)
Albumin: 3.2 g/dL — ABNORMAL LOW (ref 3.5–5.0)
Alkaline Phosphatase: 61 U/L (ref 38–126)
Anion gap: 8 (ref 5–15)
BUN: 18 mg/dL (ref 6–20)
CO2: 23 mmol/L (ref 22–32)
Calcium: 9 mg/dL (ref 8.9–10.3)
Chloride: 104 mmol/L (ref 98–111)
Creatinine, Ser: 0.51 mg/dL (ref 0.44–1.00)
GFR, Estimated: >60 mL/min (ref >60)
Glucose, Bld: 230 mg/dL — ABNORMAL HIGH (ref 70–99)
Potassium: 3.9 mmol/L (ref 3.5–5.1)
Sodium: 135 mmol/L (ref 135–145)
Total Bilirubin: 0.6 mg/dL (ref 0.3–1.2)
Total Protein: 6.2 g/dL — ABNORMAL LOW (ref 6.5–8.1)

## 2020-10-02 LAB — GLUCOSE, CAPILLARY
Glucose-Capillary: 138 mg/dL — ABNORMAL HIGH (ref 70–99)
Glucose-Capillary: 215 mg/dL — ABNORMAL HIGH (ref 70–99)
Glucose-Capillary: 199 mg/dL — ABNORMAL HIGH (ref 70–99)
Glucose-Capillary: 183 mg/dL — ABNORMAL HIGH (ref 70–99)
Glucose-Capillary: 152 mg/dL — ABNORMAL HIGH (ref 70–99)

## 2020-10-02 LAB — CBC WITH DIFFERENTIAL/PLATELET
Abs Immature Granulocytes: 0.04 10*3/uL (ref 0.00–0.07)
Basophils Absolute: 0.1 10*3/uL (ref 0.0–0.1)
Basophils Relative: 1 %
Eosinophils Absolute: 0.3 10*3/uL (ref 0.0–0.5)
Eosinophils Relative: 3 %
HCT: 45.2 % (ref 36.0–46.0)
Hemoglobin: 14.8 g/dL (ref 12.0–15.0)
Immature Granulocytes: 0 %
Lymphocytes Relative: 26 %
Lymphs Abs: 2.4 10*3/uL (ref 0.7–4.0)
MCH: 25.5 pg — ABNORMAL LOW (ref 26.0–34.0)
MCHC: 32.7 g/dL (ref 30.0–36.0)
MCV: 77.9 fL — ABNORMAL LOW (ref 80.0–100.0)
Monocytes Absolute: 0.8 10*3/uL (ref 0.1–1.0)
Monocytes Relative: 9 %
Neutro Abs: 5.6 10*3/uL (ref 1.7–7.7)
Neutrophils Relative %: 61 %
Platelets: 239 10*3/uL (ref 150–400)
RBC: 5.8 MIL/uL — ABNORMAL HIGH (ref 3.87–5.11)
RDW: 14.3 % (ref 11.5–15.5)
WBC: 9.1 10*3/uL (ref 4.0–10.5)
nRBC: 0 % (ref 0.0–0.2)

## 2020-10-02 MED ORDER — INSULIN GLARGINE 100 UNIT/ML ~~LOC~~ SOLN
65.0000 [IU] | Freq: Every day | SUBCUTANEOUS | Status: DC
  Administered 2020-10-03: 65 [IU] via SUBCUTANEOUS
  Filled 2020-10-02 (×2): qty 0.65

## 2020-10-02 NOTE — Evaluation (Signed)
Speech Language Pathology Assessment and Plan  Patient Details  Name: Amber [name] MRN: [id number] Date of Birth: Feb 20, 1971  Today's Date: 10/02/2020 SLP Individual Time: 7902-4097 SLP Individual Time Calculation (min): 60 min   Hospital Problem: Active Problems:   Left pontine cerebrovascular accident [facility])  Past Medical History:  Past Medical History:  Diagnosis Date   Depression    DM2 (diabetes mellitus, type 2) ([place])    HLD (hyperlipidemia)    HTN (hypertension)    Past Surgical History: No past surgical history on file.  Assessment / Plan / Recommendation Clinical Impression  Patient is a 50 y.o. year old female with recent admission to the hospital on 09/24/2020 after being found down for an extended amount of time with right side weakness as well as facial droop.  Cranial CT scan showed abnormal hypoattenuation within the left pons and left thalamus concerning for age-indeterminate infarct.  CT angiogram head and neck no emergent large vessel occlusion. Patient transferred to CIR on 10/01/2020 .    Patient was evaluated via Cognistat assessment and scored WNL among all tested areas including orientation, attention, memory registration and recall, constructional ability, calculations, reasoning/executive functions. Patient reported mild speech changes s/p CVA secondary to mild R side oral sensory impairment/weakness. Motor speech production is adequate and oral impairments do not appear to impede patient's ability to communicate functional needs. Speech intelligibility was perceived as 100% at conversational level. Patient was educated on speech intelligibility strategies including over articulation and pausing. She returned understanding through demonstration through adequate production of multisyllabic words, sentences, and during simple and complex conversational exchange. Provided patient with handout containing 3+ syllable words for additional practice per request. Patient denies  chewing/swallowing difficulty at this time and is tolerating regular diet and thin liquids. Expressive/receptive language deemed WFL per informal assessment. Skilled ST services are not clinically indicated at this time. ST to remain available as indicated.   Skilled Therapeutic Interventions          Administered Cognistat cognitive assessment, informal speech/language assessment, provided education on speech intelligibility strategies, provided with handout containing multisyllabic words per patient request. Patient WFL and skilled SLP services are not clinically indicated at this time. Patient verbalized understanding. Patient left in bed with alarm activated and needs within reach at end of session.    SLP Assessment  Patient does not need any further [facility] Services    Recommendations  Patient destination: Home Follow up Recommendations: None Equipment Recommended: None recommended by SLP        SLP Treatment/Interventions  NA; skilled SLP services are not clinically indicated at this time. Speech/language/cognition/swallow WFL.         Pain Pain Assessment Pain Scale: 0-10 Pain Score: 0-No pain  Prior Functioning Cognitive/Linguistic Baseline: Within functional limits Type of Home: House  Lives With: Family Available Help at Discharge: Friend(s) Education: Master's in [place] Vocation: Full time employment  SLP Evaluation Cognition Overall Cognitive Status: Within Functional Limits for tasks assessed Arousal/Alertness: Awake/alert Orientation Level: Oriented X4 Attention: Focused;Sustained Focused Attention: Appears intact Sustained Attention: Appears intact Memory: Appears intact Safety/Judgment: Appears intact  Comprehension Auditory Comprehension Overall Auditory Comprehension: Appears within functional limits for tasks assessed Yes/No Questions: Within Functional Limits Commands: Within Functional Limits Conversation: Complex Visual  Recognition/Discrimination Discrimination: Within Function Limits Reading Comprehension Reading Status: Within funtional limits Expression Expression Primary Mode of Expression: Verbal Verbal Expression Overall Verbal Expression: Appears within functional limits for tasks assessed Initiation: No impairment Level of Generative/Spontaneous Verbalization: Conversation Repetition: No  impairment Naming: No impairment Pragmatics: No impairment Non-Verbal Means of Communication: Not applicable Written Expression Dominant Hand: Right Written Expression: Not tested Oral Motor Oral Motor/Sensory Function Overall Oral Motor/Sensory Function: Mild impairment Facial ROM: Reduced right Facial Symmetry: Abnormal symmetry right Facial Strength: Within Functional Limits Facial Sensation: Reduced right Lingual ROM: Within Functional Limits Lingual Symmetry: Abnormal symmetry right Lingual Strength: Within Functional Limits Lingual Sensation: Within Functional Limits Velum: Within Functional Limits Mandible: Within Functional Limits Motor Speech Overall Motor Speech: Appears within functional limits for tasks assessed Respiration: Within functional limits Phonation: Normal Resonance: Within functional limits Articulation: Within functional limitis Intelligibility: Intelligible Motor Planning: Witnin functional limits Motor Speech Errors: Not applicable  Care Tool Care Tool Cognition Expression of Ideas and Wants Expression of Ideas and Wants: Without difficulty (complex and basic) - expresses complex messages without difficulty and with speech that is clear and easy to understand   Understanding Verbal and Non-Verbal Content Understanding Verbal and Non-Verbal Content: Understands (complex and basic) - clear comprehension without cues or repetitions   Memory/Recall Ability *first 3 days only Memory/Recall Ability *first 3 days only: Location of own room;Current season;That he or she is in  a hospital/hospital unit;Staff names and faces     Recommendations for other services: None   Discharge Criteria: Patient will be discharged from SLP if patient refuses treatment 3 consecutive times without medical reason, if treatment goals not met, if there is a change in medical status, if patient makes no progress towards goals or if patient is discharged from hospital.  The above assessment, treatment plan, treatment alternatives and goals were discussed and mutually agreed upon: by patient  Patty Sermons 10/02/2020, 6:51 PM

## 2020-10-02 NOTE — Discharge Instructions (Addendum)
Inpatient Rehab Discharge Instructions  Amber [name] Discharge date and time: No discharge date for patient encounter.   Activities/Precautions/ Functional Status: Activity: activity as tolerated Diet: diabetic diet Wound Care: Routine skin checks Functional status:  ___ No restrictions     ___ Walk up steps independently ___ 24/7 supervision/assistance   ___ Walk up steps with assistance ___ Intermittent supervision/assistance  ___ Bathe/dress independently ___ Walk with walker     _x__ Bathe/dress with assistance ___ Walk Independently    ___ Shower independently ___ Walk with assistance    ___ Shower with assistance ___ No alcohol     ___ Return to work/school ________  COMMUNITY REFERRALS UPON DISCHARGE:    Outpatient: PT     OT                Agency: [facility] Phone: [phone]              Appointment Date/Time: TBD by Facility   Medical Equipment/Items Ordered: Levan Hurst and Bedside Commode                                                 Agency/Supplier: [employer]    Special Instructions: New Patient Visit with Rema Fendt, NP on Thursday December 04, 2020 at 8:50 AM at [facility] Care at [facility]. [place] [address]) [phone] No driving smoking or alcohol  Continue aspirin 81 mg daily and Plavix 75 mg daily until January 02, 2021 then aspirin alone   My questions have been answered and I understand these instructions. I will adhere to these goals and the provided educational materials after my discharge from the hospital.  Patient/Caregiver Signature _______________________________ Date __________  Clinician Signature _______________________________________ Date __________  Please bring this form and your medication list with you to all your follow-up doctor's appointments.  STROKE/TIA DISCHARGE INSTRUCTIONS SMOKING Cigarette smoking nearly doubles your risk of having a stroke & is the single most  alterable risk factor  If you smoke or have smoked in the last 12 months, you are advised to quit smoking for your health. Most of the excess cardiovascular risk related to smoking disappears within a year of stopping. Ask you doctor about anti-smoking medications [place] Quit Line: 1-800-QUIT NOW Free Smoking Cessation Classes [phone]  CHOLESTEROL Know your levels; limit fat & cholesterol in your diet  Lipid Panel     Component Value Date/Time   CHOL 212 (H) 09/25/2020 0348   TRIG 377 (H) 09/25/2020 0348   HDL 30 (L) 09/25/2020 0348   CHOLHDL 7.1 09/25/2020 0348   VLDL 75 (H) 09/25/2020 0348   LDLCALC 107 (H) 09/25/2020 0348     Many patients benefit from treatment even if their cholesterol is at goal. Goal: Total Cholesterol (CHOL) less than 160 Goal:  Triglycerides (TRIG) less than 150 Goal:  HDL greater than 40 Goal:  LDL (LDLCALC) less than 100   BLOOD PRESSURE American Stroke Association blood pressure target is less that 120/80 mm/Hg  Your discharge blood pressure is:  BP: (!) 144/77 Monitor your blood pressure Limit your salt and alcohol intake Many individuals will require more than one medication for high blood pressure  DIABETES (A1c is a blood sugar average for last 3 months) Goal HGBA1c is under 7% (HBGA1c is blood sugar average for last 3 months)  Diabetes:   Lab Results  Component Value Date   HGBA1C 11.7 (H) 09/25/2020    Your HGBA1c can be lowered with medications, healthy diet, and exercise. Check your blood sugar as directed by your physician Call your physician if you experience unexplained or low blood sugars.  PHYSICAL ACTIVITY/REHABILITATION Goal is 30 minutes at least 4 days per week  Activity: Increase activity slowly, Therapies: Physical Therapy: Home Health Return to work:  Activity decreases your risk of heart attack and stroke and makes your heart stronger.  It helps control your weight and blood pressure; helps you relax and can improve your  mood. Participate in a regular exercise program. Talk with your doctor about the best form of exercise for you (dancing, walking, swimming, cycling).  DIET/WEIGHT Goal is to maintain a healthy weight  Your discharge diet is:  Diet Order             Diet Carb Modified Fluid consistency: Thin; Room service appropriate? Yes  Diet effective now                   liquids Your height is:  Height: 5\' 10"  (177.8 cm) Your current weight is: Weight: 122.6 kg Your Body Mass Index (BMI) is:  BMI (Calculated): 38.78 Following the type of diet specifically designed for you will help prevent another stroke. Your goal weight range is:   Your goal Body Mass Index (BMI) is 19-24. Healthy food habits can help reduce 3 risk factors for stroke:  High cholesterol, hypertension, and excess weight.  RESOURCES Stroke/Support Group:  Call [phone]   STROKE EDUCATION PROVIDED/REVIEWED AND GIVEN TO PATIENT Stroke warning signs and symptoms How to activate emergency medical system (call 911). Medications prescribed at discharge. Need for follow-up after discharge. Personal risk factors for stroke. Pneumonia vaccine given: No Flu vaccine given: No My questions have been answered, the writing is legible, and I understand these instructions.  I will adhere to these goals & educational materials that have been provided to me after my discharge from the hospital.

## 2020-10-02 NOTE — Evaluation (Signed)
Occupational Therapy Assessment and Plan  Patient Details  Name: Amber [name] MRN: [id number] Date of Birth: 1971-03-04  OT Diagnosis: abnormal posture, hemiplegia affecting dominant side, and muscle weakness (generalized) Rehab Potential: Rehab Potential (ACUTE ONLY): Excellent ELOS: 12-14 days   Today's Date: 10/02/2020 OT Individual Time: 4492-0100 OT Individual Time Calculation (min): 63 min     Hospital Problem: Active Problems:   Left pontine cerebrovascular accident [facility])   Past Medical History:  Past Medical History:  Diagnosis Date   Depression    DM2 (diabetes mellitus, type 2) ([place])    HLD (hyperlipidemia)    HTN (hypertension)    Past Surgical History: No past surgical history on file.  Assessment & Plan Clinical Impression: Patient is a 50 y.o. year old female with recent admission to the hospital on 09/24/2020 after being found down for an extended amount of time with right side weakness as well as facial droop.  Cranial CT scan showed abnormal hypoattenuation within the left pons and left thalamus concerning for age-indeterminate infarct.  CT angiogram head and neck no emergent large vessel occlusion. Patient transferred to CIR on 10/01/2020 .    Patient currently requires mod with basic self-care skills secondary to muscle weakness and muscle paralysis, impaired timing and sequencing, unbalanced muscle activation, and decreased coordination, and decreased sitting balance, decreased standing balance, decreased postural control, hemiplegia, and decreased balance strategies.  Prior to hospitalization, patient could complete ADLs and IADLs with independent .  Patient will benefit from skilled intervention to decrease level of assist with basic self-care skills, increase independence with basic self-care skills, and increase level of independence with iADL prior to discharge home with care partner.  Anticipate patient will require intermittent supervision and follow up  outpatient.  OT - End of Session Activity Tolerance: Improving Endurance Deficit: Yes OT Assessment Rehab Potential (ACUTE ONLY): Excellent OT Patient demonstrates impairments in the following area(s): Balance;Endurance;Motor OT Basic ADL's Functional Problem(s): Eating;Grooming;Bathing;Dressing;Toileting OT Advanced ADL's Functional Problem(s): Simple Meal Preparation OT Transfers Functional Problem(s): Toilet;Tub/Shower OT Additional Impairment(s): Fuctional Use of Upper Extremity OT Plan OT Intensity: Minimum of 1-2 x/day, 45 to 90 minutes OT Frequency: 5 out of 7 days OT Duration/Estimated Length of Stay: 12-14 days OT Treatment/Interventions: Balance/vestibular training;Community reintegration;DME/adaptive equipment instruction;Disease mangement/prevention;Discharge planning;Functional electrical stimulation;Functional mobility training;Neuromuscular re-education;Wheelchair propulsion/positioning;UE/LE Strength taining/ROM;Visual/perceptual remediation/compensation;Therapeutic Exercise;UE/LE Coordination activities;Therapeutic Activities;Self Care/advanced ADL retraining;Patient/family education;Splinting/orthotics;Pain management OT Self Feeding Anticipated Outcome(s): modified independent OT Basic Self-Care Anticipated Outcome(s): supervision OT Toileting Anticipated Outcome(s): supervision OT Bathroom Transfers Anticipated Outcome(s): supervision OT Recommendation Recommendations for Other Services: Therapeutic Recreation consult Therapeutic Recreation Interventions: Stress management Patient destination: Home Follow Up Recommendations: Outpatient OT;24 hour supervision/assistance Equipment Recommended: To be determined   OT Evaluation Precautions/Restrictions  Precautions Precautions: Fall Precaution Comments: right hemiparesis Restrictions Weight Bearing Restrictions: No  Pain Pain Assessment Pain Scale: 0-10 Pain Score: 0-No pain Home Living/Prior Functioning Home  Living Available Help at Discharge: Friend(s) (going to significant other's house, one level) Type of Home: House Home Access: Level entry (fiancee's house) Entrance Stairs-Number of Steps: 3 Entrance Stairs-Rails: Right, Left, Can reach both Bathroom Shower/Tub: Multimedia programmer: Standard  Lives With: Family (lives in her mom's basement) IADL History Homemaking Responsibilities: Yes Current License: Yes Occupation: Full time employment 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 Level of Independence: Independent with basic ADLs  Able to Take Stairs?: Yes Driving: Yes Vocation: Full time employment Vision Baseline Vision/History: Wears glasses;Cataracts Wears Glasses: At all times Patient Visual Report: No change  from baseline Vision Assessment?: Yes Eye Alignment: Impaired (comment) (right eye with premorbid esotropia) Ocular Range of Motion: Within Functional Limits Alignment/Gaze Preference: Within Defined Limits Tracking/Visual Pursuits: Able to track stimulus in all quads without difficulty Saccades: Within functional limits Visual Fields: No apparent deficits Perception  Perception: Within Functional Limits Praxis Praxis: Intact Cognition Overall Cognitive Status: Within Functional Limits for tasks assessed Arousal/Alertness: Awake/alert Orientation Level: Person;Place;Situation Person: Oriented Place: Oriented Situation: Oriented Year: 2022 Month: July Day of Week: Correct Memory: Appears intact Immediate Memory Recall: Sock;Blue;Bed Memory Recall Sock: Without Cue Memory Recall Blue: Without Cue Attention: Sustained Sustained Attention: Appears intact Awareness: Appears intact Problem Solving: Appears intact Safety/Judgment: Appears intact Sensation Sensation Light Touch: Appears Intact Proprioception: Appears Intact Stereognosis: Not tested Coordination Gross Motor Movements are Fluid and Coordinated: No Fine Motor  Movements are Fluid and Coordinated: No Coordination and Movement Description: Pt with right hemiparesis, using the RUE at an active assist level for selfcare tasks with min facilitation. Motor  Motor Motor: Hemiplegia Motor - Skilled Clinical Observations: RUE and RLE hemiparesis  Trunk/Postural Assessment  Cervical Assessment Cervical Assessment: Within Functional Limits Thoracic Assessment Thoracic Assessment: Exceptions to [facility] (rounding posture) Lumbar Assessment Lumbar Assessment: Exceptions to [facility] (posterior pelvic tilt)  Balance Balance Balance Assessed: Yes Static Sitting Balance Static Sitting - Balance Support: Feet supported Static Sitting - Level of Assistance: 5: Stand by assistance Dynamic Sitting Balance Dynamic Sitting - Balance Support: During functional activity Dynamic Sitting - Level of Assistance: 5: Stand by assistance Static Standing Balance Static Standing - Balance Support: During functional activity Static Standing - Level of Assistance: 4: Min assist Dynamic Standing Balance Dynamic Standing - Balance Support: During functional activity Dynamic Standing - Level of Assistance: 3: Mod assist Extremity/Trunk Assessment RUE Assessment RUE Assessment: Exceptions to [facility] Passive Range of Motion (PROM) Comments: WFLS for all joints Active Range of Motion (AROM) Comments: Brunnstrum stage IV-V in the hand and arm General Strength Comments: She is able to oppose the thumb to all digits with decreased accuracy on the 4th and 5th digits.  Using the RUE at an active assist level with bathing tasks and dressing with min to mod assist level. LUE Assessment LUE Assessment: Within Functional Limits  Care Tool Care Tool Self Care Eating        Oral Care         Bathing   Body parts bathed by patient: Right arm;Chest;Abdomen;Right upper leg;Left upper leg;Right lower leg;Left lower leg;Face Body parts bathed by helper: Front perineal area;Buttocks;Left arm    Assist Level: Minimal Assistance - Patient > 75%    Upper Body Dressing(including orthotics)   What is the patient wearing?: Pull over shirt   Assist Level: Moderate Assistance - Patient 50 - 74%    Lower Body Dressing (excluding footwear)   What is the patient wearing?: Pants;Incontinence brief Assist for lower body dressing: Moderate Assistance - Patient 50 - 74%    Putting on/Taking off footwear   What is the patient wearing?: Shoes;Socks Assist for footwear: Minimal Assistance - Patient > 75%       Care Tool Toileting Toileting activity   Assist for toileting: Moderate Assistance - Patient 50 - 74%     Care Tool Bed Mobility Roll left and right activity        Sit to lying activity        Lying to sitting edge of bed activity   Lying to sitting edge of bed assist level: Minimal Assistance -  Patient > 75%     Care Tool Transfers Sit to stand transfer   Sit to stand assist level: Minimal Assistance - Patient > 75%    Chair/bed transfer         Toilet transfer   Assist Level: Moderate Assistance - Patient 50 - 74% (ambulate no assistive device)     Care Tool Cognition Expression of Ideas and Wants Expression of Ideas and Wants: Without difficulty (complex and basic) - expresses complex messages without difficulty and with speech that is clear and easy to understand   Understanding Verbal and Non-Verbal Content Understanding Verbal and Non-Verbal Content: Understands (complex and basic) - clear comprehension without cues or repetitions   Memory/Recall Ability *first 3 days only Memory/Recall Ability *first 3 days only: Location of own room;Current season;That he or she is in a hospital/hospital unit    Refer to Care Plan for [place] 1 OT Short Term Goal 1 (Week 1): Pt will complete LB dressing with min assist sit to stand. OT Short Term Goal 2 (Week 1): Pt will complete all UB bathing with supervision in sitting. OT Short Term  Goal 3 (Week 1): Pt will complete toilet transfers with min guard assist using the RW for support to the [facility]. OT Short Term Goal 4 (Week 1): Pt will use the RUE at an active assist level with supervision.  Recommendations for other services: Therapeutic Recreation  Stress management   Session Note:  Pt in bed to start session, eager to work in therapy.  She was able to transfer to sitting with min assist and then ambulated to the toilet with mod assist and no device.  Increased left knee buckling noted.  She completed toileting tasks with min assist and then transferred over to the tub bench with mod assist using the grab bars for support.  She completed bathing with overall min assist in sitting with min to mod assist for use of the RUE for washing several parts of her body.  Mod assist for ambulation out to the bedside chair at the sink for dressing.  Mod assist for donning pullover shirt with mod assist for donning all LB clothing sit to stand.  Finished session with pt sitting in the bedside chair with the call button and phone in reach and safety belt in place.    Skilled Therapeutic Intervention ADL ADL Eating: Supervision/safety Where Assessed-Eating: Edge of bed Grooming: Minimal assistance Where Assessed-Grooming: Chair Upper Body Bathing: Minimal assistance Where Assessed-Upper Body Bathing: Shower;Chair Lower Body Bathing: Minimal assistance Where Assessed-Lower Body Bathing: Shower Upper Body Dressing: Moderate assistance Where Assessed-Upper Body Dressing: Chair Lower Body Dressing: Moderate assistance Where Assessed-Lower Body Dressing: Chair;Sitting at sink;Standing at sink Toileting: Minimal assistance Where Assessed-Toileting: Bedside Commode Toilet Transfer: Moderate assistance Toilet Transfer Method: Counselling psychologist: Radiographer, therapeutic: Not assessed Social research officer, government: Moderate assistance Social research officer, government Method:  Heritage manager: Transfer tub bench;Grab bars Mobility  Bed Mobility Bed Mobility: Supine to Sit Supine to Sit: Minimal Assistance - Patient > 75% Transfers Sit to Stand: Minimal Assistance - Patient > 75% Stand to Sit: Minimal Assistance - Patient > 75%   Discharge Criteria: Patient will be discharged from OT if patient refuses treatment 3 consecutive times without medical reason, if treatment goals not met, if there is a change in medical status, if patient makes no progress towards goals or if patient is discharged from hospital.  The above assessment, treatment  plan, treatment alternatives and goals were discussed and mutually agreed upon: by patient  MCGUIRE,JAMES OTR/L 10/02/2020, 12:43 PM  

## 2020-10-02 NOTE — Progress Notes (Signed)
PROGRESS NOTE   Subjective/Complaints:  Had shower with OT this am , discussed CBGs   ROS- neg CP, SOB, N/V/D Objective:   No results found. Recent Labs    10/01/20 0908 10/02/20 0459  WBC 8.0 9.1  HGB 17.8* 14.8  HCT 54.3* 45.2  PLT 188 239   Recent Labs    10/01/20 0908 10/02/20 0459  NA 132* 135  K 4.0 3.9  CL 100 104  CO2 21* 23  GLUCOSE 250* 230*  BUN 16 18  CREATININE 0.54 0.51  CALCIUM 9.1 9.0    Intake/Output Summary (Last 24 hours) at 10/02/2020 4098 Last data filed at 10/01/2020 2345 Gross per 24 hour  Intake --  Output 400 ml  Net -400 ml        Physical Exam: Vital Signs Blood pressure (!) 144/77, pulse 77, temperature 98.7 F (37.1 C), resp. rate 17, height 5\' 10"  (1.778 m), weight 122.6 kg, SpO2 93 %.  General: No acute distress Mood and affect are appropriate Heart: Regular rate and rhythm no rubs murmurs or extra sounds Lungs: Clear to auscultation, breathing unlabored, no rales or wheezes Abdomen: Positive bowel sounds, soft nontender to palpation, nondistended Extremities: No clubbing, cyanosis, or edema Skin: No evidence of breakdown, no evidence of rash Neurologic: Cranial nerves II through XII intact, motor strength is 5/5 in left 4/5 RIght deltoid, bicep, tricep, grip, hip flexor, knee extensors, ankle dorsiflexor and plantar flexor Sensory exam normal sensation to light touch and proprioception in bilateral upper and lower extremities  Musculoskeletal: Full range of motion in all 4 extremities. No joint swelling    Assessment/Plan: 1. Functional deficits which require 3+ hours per day of interdisciplinary therapy in a comprehensive inpatient rehab setting. Physiatrist is providing close team supervision and 24 hour management of active medical problems listed below. Physiatrist and rehab team continue to assess barriers to discharge/monitor patient progress toward functional  and medical goals  Care Tool:  Bathing              Bathing assist       Upper Body Dressing/Undressing Upper body dressing        Upper body assist Assist Level: Minimal Assistance - Patient > 75%    Lower Body Dressing/Undressing Lower body dressing            Lower body assist       Toileting Toileting    Toileting assist Assist for toileting: Moderate Assistance - Patient 50 - 74%     Transfers Chair/bed transfer  Transfers assist           Locomotion Ambulation   Ambulation assist              Walk 10 feet activity   Assist           Walk 50 feet activity   Assist           Walk 150 feet activity   Assist           Walk 10 feet on uneven surface  activity   Assist           Wheelchair     Assist  Wheelchair 50 feet with 2 turns activity    Assist            Wheelchair 150 feet activity     Assist          Blood pressure (!) 144/77, pulse 77, temperature 98.7 F (37.1 C), resp. rate 17, height 5\' 10"  (1.778 m), weight 122.6 kg, SpO2 93 %.  Medical Problem List and Plan: 1.  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  Antithrombotics: -DVT/anticoagulation: SCDs             -antiplatelet therapy: Aspirin 81 mg daily and Plavix 75 mg daily x3 months then aspirin alone 3. Pain Management: Flexeril 5 mg 3 times daily as needed, Tylenol as needed 4. Mood: Zoloft 100 mg daily             -antipsychotic agents: N/A 5. Neuropsych: This patient is capable of making decisions on her own behalf. 6. Skin/Wound Care: Routine skin checks 7. Fluids/Electrolytes/Nutrition: Routine in and outs with follow-up chemistries 8.  Permissive hypertension.  Presently on HCTZ 12.5 mg daily.  Patient on Zestoretic 20-12.5 mg twice daily prior to  admission.  Resume as needed Vitals:   10/01/20 2004 10/02/20 0512  BP: 129/61 (!) 144/77  Pulse: 84 77  Resp: 18 17  Temp: 98.4 F (36.9 C) 98.7 F (37.1 C)  SpO2: 93% 93%   Good control on HCTZ 12.5mg  daily 9.  Diabetes mellitus.  Hemoglobin A1c 11.7.  NovoLog 8 units 3 times daily, Lantus insulin 60 units daily, Glucotrol 5 mg daily.  Check blood sugars before meals and at bedtime CBG (last 3)  Recent Labs    10/01/20 1311 10/01/20 2130 10/02/20 0606  GLUCAP 201* 242* 215*  Elevated am CBG will increase Lantus to 65U 7/14  10.  Thrombophlebitis.  Complete course of Keflex 11.  Hyperlipidemia.  Lipitor  12.  On stroke /sleep apnea research trial   LOS: 1 days A FACE TO FACE EVALUATION WAS PERFORMED  8/14 10/02/2020, 8:21 AM

## 2020-10-02 NOTE — Progress Notes (Signed)
Inpatient Rehabilitation Care Coordinator Assessment and Plan Patient Details  Name: Amber [name] MRN: [id number] Date of Birth: 07/01/1970  Today's Date: 10/02/2020  Hospital Problems: Active Problems:   Left pontine cerebrovascular accident [facility])  Past Medical History:  Past Medical History:  Diagnosis Date   Depression    DM2 (diabetes mellitus, type 2) ([place])    HLD (hyperlipidemia)    HTN (hypertension)    Past Surgical History: No past surgical history on file. Social History:  has no history on file for tobacco use, alcohol use, and drug use.  Family / Support Systems Marital Status: Single Spouse/Significant Other: Solicitor (S.O) Other Supports: Amber [name] (Mother), Amber [name] (Niece) Anticipated Caregiver: Amber [name] Ability/Limitations of Caregiver: Supervision  Social History Preferred language: English Religion:  Read: Yes Write: Yes Legal History/Current Legal Issues: n/a Guardian/Conservator: n/a   Abuse/Neglect Abuse/Neglect Assessment Can Be Completed: Yes Physical Abuse: Denies Verbal Abuse: Denies Sexual Abuse: Denies Exploitation of patient/patient's resources: Denies Self-Neglect: Denies  Emotional Status Pt's affect, behavior and adjustment status: Very pleasant and motivated for therapy Recent Psychosocial Issues: Depression Psychiatric History: Depression Substance Abuse History: n/a  Patient / Family Perceptions, Expectations & Goals Pt/Family understanding of illness & functional limitations: yes Premorbid pt/family roles/activities: Patient previously active, independent and working Anticipated changes in roles/activities/participation: Family able to assist 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 Can maintain on main level, 14 steps to basement. Level entry) Insurance Resources: Multimedia programmer (specify), Nurse, mental health (specify name) Conservator, museum/gallery) Financial Resources: Employment Financial Screen Referred: No Living Expenses: Lives with family Money Management: Patient Does the patient have any problems obtaining your medications?: No Home Management: Independent Patient/Family Preliminary Plans: Family able to assist with money and medication management Care Coordinator Anticipated Follow Up Needs: HH/OP Expected length of stay: 12-14 Days  Clinical Impression Sw met with patient, introduced self, explained role and addressed questions and concerns. Pt highly motivated and pleasant, excited for rehab.Questions about DME. No additional questions or concerns, SW will continue to follow up.  Dyanne Iha 10/02/2020, 1:46 PM

## 2020-10-02 NOTE — Progress Notes (Signed)
Patient ID: Amber [name], female   DOB: 1970-09-04, 50 y.o.   MRN: [id number] Met with the patient to introduce self, role of the nurse CM and skin check with assigned nurse. Reviewed rehab routine, therapy schedule and plan of care. Patient noted she was aware of having a stroke however did not seek medical assistance due to stubbornness and denial. Reports aware she cannot return to preadmission living situation and plans discharge home with fiance'. Discussed secondary stroke risks including HTN, HLD (LDL: 107), T2DM (A1C :11.7) with dietary modifications + medications to address issues. Reviewed DAPT x 3 months per MD then ASA solo. Yeast/MASD to groin area bil; using powder and interdry to areas. Bruising on bil shins. Continue to follow along to discharge to address educational needs and facilitate preparation for discharge .me

## 2020-10-02 NOTE — Progress Notes (Signed)
Inpatient Rehabilitation Center Individual Statement of Services  Patient Name:  Amber [name]  Date:  10/02/2020  Welcome to the Inpatient Rehabilitation Center.  Our goal is to provide you with an individualized program based on your diagnosis and situation, designed to meet your specific needs.  With this comprehensive rehabilitation program, you will be expected to participate in at least 3 hours of rehabilitation therapies Monday-Friday, with modified therapy programming on the weekends.  Your rehabilitation program will include the following services:  Physical Therapy (PT), Occupational Therapy (OT), Speech Therapy (ST), 24 hour per day rehabilitation nursing, Therapeutic Recreaction (TR), Neuropsychology, Care Coordinator, Rehabilitation Medicine, Nutrition Services, Pharmacy Services, and Other  Weekly team conferences will be held on Wednesdays to discuss your progress.  Your Inpatient Rehabilitation Care Coordinator will talk with you frequently to get your input and to update you on team discussions.  Team conferences with you and your family in attendance may also be held.  Expected length of stay: 12-14 Days  Overall anticipated outcome: Supervision  Depending on your progress and recovery, your program may change. Your Inpatient Rehabilitation Care Coordinator will coordinate services and will keep you informed of any changes. Your Inpatient Rehabilitation Care Coordinator's name and contact numbers are listed  below.  The following services may also be recommended but are not provided by the Inpatient Rehabilitation Center:   Home Health Rehabiltiation Services Outpatient Rehabilitation Services    Arrangements will be made to provide these services after discharge if needed.  Arrangements include referral to agencies that provide these services.  Your insurance has been verified to be:  UNINSURED Your primary doctor is:  NO PCP  Pertinent information will be shared with  your doctor and your insurance company.  Inpatient Rehabilitation Care Coordinator:  Lavera Guise, [place] [phone] or [phone]  Information discussed with and copy given to patient by: Andria Rhein, 10/02/2020, 12:00 PM

## 2020-10-02 NOTE — Progress Notes (Signed)
Inpatient Rehabilitation  Patient information reviewed and entered into eRehab system by Keta [place] Taina Landry, OTR/L.   Information including medical coding, functional ability and quality indicators will be reviewed and updated through discharge.    

## 2020-10-02 NOTE — Evaluation (Signed)
Physical Therapy Assessment and Plan  Patient Details  Name: Amber [name] MRN: [id number] Date of Birth: 1970/06/10  PT Diagnosis: Abnormality of gait, Difficulty walking, Hemiparesis dominant, Hypotonia, and Muscle weakness Rehab Potential: Good ELOS: ~ 2 weeks   Today's Date: 10/02/2020 PT Individual Time: 1310-1405 PT Individual Time Calculation (min): 55 min    Hospital Problem: Active Problems:   Left pontine cerebrovascular accident [facility])   Past Medical History:  Past Medical History:  Diagnosis Date   Depression    DM2 (diabetes mellitus, type 2) ([place])    HLD (hyperlipidemia)    HTN (hypertension)    Past Surgical History: No past surgical history on file.  Assessment & Plan Clinical Impression: Patient is a 50 y.o. year old right-handed female with history of hypertension hyperlipidemia type 2 diabetes mellitus.  Per chart review patient lives in an apartment in mothers basement.  Independent prior to admission.  She has 14 steps down to the living area..  She works full-time as a Education officer, museum.  Presented 09/24/2020 after being found down for an extended amount of time with right side weakness as well as facial droop.  Cranial CT scan showed abnormal hypoattenuation within the left pons and left thalamus concerning for age-indeterminate infarct.  CT angiogram head and neck no emergent large vessel occlusion.  Patient did not receive tPA.  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  No hemorrhage or mass-effect.  Echocardiogram with ejection fraction of 60 to 88% grade 2 diastolic dysfunction.  No regional wall motion abnormalities.  Admission chemistries unremarkable except sodium 133, glucose 310, CK1 112 urinalysis positive nitrite.  Presently maintained on aspirin as well as Plavix for CVA prophylaxis x3 months then aspirin alone.  Hospital course patient did develop some right antecubital redness and swelling suspect thrombophlebitis placed on a 7-day course of Keflex.   Maintained on a regular consistency diet.  Therapy evaluations completed due to patient's right side weakness decreased functional mobility was admitted for a comprehensive rehab program.  Patient transferred to CIR on 10/01/2020 .   Patient currently requires mod assist with mobility secondary to muscle weakness, decreased cardiorespiratoy endurance, impaired timing and sequencing, abnormal tone, and unbalanced muscle activation,  , and decreased sitting balance, decreased standing balance, decreased postural control, hemiplegia, and decreased balance strategies.  Prior to hospitalization, patient was independent  with mobility and lived with Family (lives in her mom's basement) in a House home.  Home access is 3Stairs to enter (fiance's house).  Patient will benefit from skilled PT intervention to maximize safe functional mobility, minimize fall risk, and decrease caregiver burden for planned discharge home with 24 hour supervision.  Anticipate patient will benefit from follow up OP at discharge.  PT - End of Session Activity Tolerance: Tolerates 30+ min activity with multiple rests Endurance Deficit: Yes Endurance Deficit Description: requires seated rest breaks PT Assessment Rehab Potential (ACUTE/IP ONLY): Good PT Barriers to Discharge: Inaccessible home environment;Decreased caregiver support PT Patient demonstrates impairments in the following area(s): Balance;Safety;Behavior;Sensory;Edema;Skin Integrity;Endurance;Motor;Nutrition;Pain;Perception PT Transfers Functional Problem(s): Bed Mobility;Car;Bed to Chair;Furniture;Floor PT Locomotion Functional Problem(s): Ambulation;Stairs PT Plan PT Intensity: Minimum of 1-2 x/day ,45 to 90 minutes PT Frequency: 5 out of 7 days PT Duration Estimated Length of Stay: ~ 2 weeks PT Treatment/Interventions: Ambulation/gait training;Community reintegration;DME/adaptive equipment instruction;Neuromuscular re-education;Psychosocial support;Stair  training;UE/LE Strength taining/ROM;Balance/vestibular training;Discharge planning;Functional electrical stimulation;Pain management;Skin care/wound management;Therapeutic Activities;UE/LE Coordination activities;Cognitive remediation/compensation;Disease management/prevention;Functional mobility training;Patient/family education;Splinting/orthotics;Therapeutic Exercise;Visual/perceptual remediation/compensation PT Transfers Anticipated Outcome(s): supervision using LRAD PT Locomotion  Anticipated Outcome(s): supervision using LRAD PT Recommendation Recommendations for Other Services: Therapeutic Recreation consult Therapeutic Recreation Interventions: Outing/community reintergration Follow Up Recommendations: Outpatient PT;24 hour supervision/assistance Patient destination: Home Equipment Recommended: To be determined   PT Evaluation Precautions/Restrictions Precautions Precautions: Fall;Other (comment) Precaution Comments: R hemiparesis Restrictions Weight Bearing Restrictions: No Pain Pain Assessment Pain Scale: 0-10 Pain Score: 0-No pain Home Living/Prior Functioning Home Living Available Help at Discharge: Friend(s) (going to significant other's house, one level) Type of Home: House Home Access: Stairs to enter (fiance's house) Technical brewer of Steps: 3 Entrance Stairs-Rails:  (unsure) Home Layout: One level Bathroom Shower/Tub: Multimedia programmer: Standard  Lives With: Family (lives in her mom's basement) Prior Function Level of Independence: Independent with gait;Independent with transfers;Independent with homemaking with ambulation  Able to Take Stairs?: Yes Driving: Yes Vocation: Full time employment Vocation Requirements: 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 [employer] Comments: Pt works full time as Therapist, nutritional: Within Advertising copywriter Praxis Praxis: Intact  Cognition  Overall Cognitive Status:  Within Functional Limits for tasks assessed Arousal/Alertness: Awake/alert Orientation Level: Oriented X4 Attention: Focused;Sustained Focused Attention: Appears intact Sustained Attention: Appears intact Awareness: Appears intact Safety/Judgment: Appears intact Sensation  Sensation Light Touch: Appears Intact Hot/Cold: Not tested Proprioception: Appears Intact Stereognosis: Not tested Coordination Gross Motor Movements are Fluid and Coordinated: No Coordination and Movement Description: Pt with right hemiparesis (UE>LE) resulting in impaired GM coordination Motor  Motor Motor: Hemiplegia;Abnormal tone Motor - Skilled Clinical Observations: RUE > RLE hemiparesis   Trunk/Postural Assessment  Cervical Assessment Cervical Assessment: Within Functional Limits Thoracic Assessment Thoracic Assessment: Exceptions to [facility] (rounding posture) Lumbar Assessment Lumbar Assessment: Exceptions to [facility] (posterior pelvic tilt)  Balance Balance Balance Assessed: Yes Static Sitting Balance Static Sitting - Balance Support: Feet supported Static Sitting - Level of Assistance: 5: Stand by assistance Dynamic Sitting Balance Dynamic Sitting - Balance Support: During functional activity Dynamic Sitting - Level of Assistance: 5: Stand by assistance Static Standing Balance Static Standing - Balance Support: During functional activity Static Standing - Level of Assistance: 4: Min assist Dynamic Standing Balance Dynamic Standing - Balance Support: During functional activity Dynamic Standing - Level of Assistance: 3: Mod assist Extremity Assessment  RLE Assessment RLE Assessment: Exceptions to [facility] Active Range of Motion (AROM) Comments: WFL RLE Strength Right Hip Flexion: 2+/5 Right Knee Flexion: 3-/5 Right Knee Extension: 3/5 Right Ankle Dorsiflexion: 3/5 Right Ankle Plantar Flexion: 3+/5 RLE Tone RLE Tone: Mild;Hypotonic LLE Assessment LLE Assessment: Within Functional Limits General  Strength Comments: assessed to be 5/5 throughout in sitting  Care Tool Care Tool Bed Mobility Roll left and right activity   Roll left and right assist level: Supervision/Verbal cueing    Sit to lying activity   Sit to lying assist level: Minimal Assistance - Patient > 75%    Lying to sitting edge of bed activity   Lying to sitting edge of bed assist level: Minimal Assistance - Patient > 75%     Care Tool Transfers Sit to stand transfer   Sit to stand assist level: Minimal Assistance - Patient > 75%    Chair/bed transfer   Chair/bed transfer assist level: Moderate Assistance - Patient 50 - 74%     Product manager transfer assist level: Moderate Assistance - Patient 50 - 74%      Care Tool Locomotion Ambulation   Assist level: Moderate Assistance - Patient 50 -  74% Assistive device: No Device Max distance: 24f  Walk 10 feet activity   Assist level: Moderate Assistance - Patient - 50 - 74% Assistive device: No Device   Walk 50 feet with 2 turns activity   Assist level: Moderate Assistance - Patient - 50 - 74% Assistive device: No Device  Walk 150 feet activity Walk 150 feet activity did not occur: Safety/medical concerns      Walk 10 feet on uneven surfaces activity Walk 10 feet on uneven surfaces activity did not occur: Safety/medical concerns      Stairs Stair activity did not occur: Safety/medical concerns (not available)        Walk up/down 1 step activity Walk up/down 1 step or curb (drop down) activity did not occur: Safety/medical concerns     Walk up/down 4 steps activity did not occuR: Safety/medical concerns  Walk up/down 4 steps activity      Walk up/down 12 steps activity Walk up/down 12 steps activity did not occur: Safety/medical concerns      Pick up small objects from floor Pick up small object from the floor (from standing position) activity did not occur: Safety/medical concerns      Wheelchair Will patient use  wheelchair at discharge?: No          Wheel 50 feet with 2 turns activity      Wheel 150 feet activity        Refer to Care Plan for Long Term Goals  SHORT TERM GOAL WEEK 1 PT Short Term Goal 1 (Week 1): Pt will perform supine<>sit supervision PT Short Term Goal 2 (Week 1): Pt will perform sit<>stands using LRAD with CGA PT Short Term Goal 3 (Week 1): Pt will perform bed<>chair transfers using LRAD with CGA PT Short Term Goal 4 (Week 1): Pt will ambulate at least 1044fusing LRAD with min assist of 1 PT Short Term Goal 5 (Week 1): Pt will ascend/descend 4 steps using HRs with min assist of 1  Recommendations for other services: Therapeutic Recreation  Stress management and Outing/community reintegration  Skilled Therapeutic Intervention Pt received sitting in chair and agreeable to therapy session. Evaluation completed (see details above) with patient education regarding purpose of PT evaluation, PT POC and goals, therapy schedule, weekly team meetings, and other CIR information including safety plan and fall risk safety. Pt performed the below mobility tasks with the specified levels of assistance while therapist cuing and facilitating normalized movement patterns. Gait training [id number] UE support around therapist's shoulders, and mod assist while guarding R knee during stance due to slight instability noted but not true buckling and 2x hyperextension. Therapist provided pt with w/c to promote increased, upright OOB activity tolerance. At end of session pt left supine in bed with needs in reach, R UE therapeutically positioned on pillows, and bed alarm on.   Mobility Bed Mobility Bed Mobility: Sit to Supine;Supine to Sit Supine to Sit: Minimal Assistance - Patient > 75% Sit to Supine: Contact Guard/Touching assist Transfers Transfers: Sit to Stand;Stand to Sit;Stand Pivot Transfers Sit to Stand: Minimal Assistance - Patient > 75% Stand to Sit: Minimal Assistance - Patient >  75% Stand Pivot Transfers: Moderate Assistance - Patient 50 - 74% Stand Pivot Transfer Details: Verbal cues for gait pattern;Verbal cues for technique;Verbal cues for sequencing;Verbal cues for precautions/safety;Tactile cues for weight shifting;Tactile cues for sequencing;Tactile cues for placement;Tactile cues for initiation;Manual facilitation for weight shifting Transfer (Assistive device): None Locomotion  Gait Ambulation: Yes Gait Assistance: Moderate Assistance -  Patient 50-74% Gait Distance (Feet): 63 Feet Assistive device: Other (Comment) (R UE around therapist's shoulders) Gait Assistance Details: Manual facilitation for weight shifting;Tactile cues for weight shifting;Tactile cues for sequencing;Tactile cues for placement;Verbal cues for sequencing;Verbal cues for technique;Verbal cues for gait pattern;Verbal cues for precautions/safety Gait Assistance Details: guarding R knee during stance due to minor instability noted and a few instances of hyperextension Gait Gait: Yes Gait Pattern: Impaired Gait Pattern: Step-to pattern;Step-through pattern;Decreased step length - left;Decreased stance time - right Gait velocity: decreased Stairs / Additional Locomotion Stairs: No Wheelchair Mobility Wheelchair Mobility: No   Discharge Criteria: Patient will be discharged from PT if patient refuses treatment 3 consecutive times without medical reason, if treatment goals not met, if there is a change in medical status, if patient makes no progress towards goals or if patient is discharged from hospital.  The above assessment, treatment plan, treatment alternatives and goals were discussed and mutually agreed upon: by patient  Tawana Scale , PT, DPT, NCS, CSRS 10/02/2020, 1:04 PM

## 2020-10-03 LAB — GLUCOSE, CAPILLARY
Glucose-Capillary: 199 mg/dL — ABNORMAL HIGH (ref 70–99)
Glucose-Capillary: 172 mg/dL — ABNORMAL HIGH (ref 70–99)
Glucose-Capillary: 142 mg/dL — ABNORMAL HIGH (ref 70–99)
Glucose-Capillary: 140 mg/dL — ABNORMAL HIGH (ref 70–99)

## 2020-10-03 MED ORDER — INSULIN GLARGINE 100 UNIT/ML ~~LOC~~ SOLN
70.0000 [IU] | Freq: Every day | SUBCUTANEOUS | Status: DC
  Administered 2020-10-04 – 2020-10-13 (×10): 70 [IU] via SUBCUTANEOUS
  Filled 2020-10-03 (×11): qty 0.7

## 2020-10-03 MED ORDER — HYDROCORTISONE 1 % EX CREA
TOPICAL_CREAM | Freq: Two times a day (BID) | CUTANEOUS | Status: DC
  Filled 2020-10-03 (×2): qty 28

## 2020-10-03 NOTE — IPOC Note (Signed)
Overall Plan of Care [facility]) Patient Details Name: Amber [name] MRN: [id number] DOB: 11/17/1970  Admitting Diagnosis: <principal problem not specified>  Hospital Problems: Active Problems:   Left pontine cerebrovascular accident [facility])     Functional Problem List: Nursing Medication Management, Safety, Bowel, Pain, Endurance  PT Balance, Safety, Behavior, Sensory, Edema, Skin Integrity, Endurance, Motor, Nutrition, Pain, Perception  OT Balance, Endurance, Motor  SLP    TR         Basic ADL's: OT Eating, Grooming, Bathing, Dressing, Toileting     Advanced  ADL's: OT Simple Meal Preparation     Transfers: PT Bed Mobility, Car, Bed to Chair, [employer], Floor  OT Toilet, Tub/Shower     Locomotion: PT Ambulation, Stairs     Additional Impairments: OT Fuctional Use of Upper Extremity  SLP        TR      Anticipated Outcomes Item Anticipated Outcome  Self Feeding modified independent  Swallowing      Basic self-care  supervision  Toileting  supervision   Bathroom Transfers supervision  Bowel/Bladder  manage bowel  with mod I assist  Transfers  supervision using LRAD  Locomotion  supervision using LRAD  Communication  NA - skilled ST services not indicated  Cognition  NA - skilled ST services not indicated  Pain  at or below level 4  Safety/Judgment  maintain safety with cues/reminders   Therapy Plan: PT Intensity: Minimum of 1-2 x/day ,45 to 90 minutes PT Frequency: 5 out of 7 days PT Duration Estimated Length of Stay: ~ 2 weeks OT Intensity: Minimum of 1-2 x/day, 45 to 90 minutes OT Frequency: 5 out of 7 days OT Duration/Estimated Length of Stay: 12-14 days     Due to the current state of emergency, patients may not be receiving their 3-hours of Medicare-mandated therapy.   Team Interventions: Nursing Interventions Bowel Management, Patient/Family Education, Disease Management/Prevention, Medication Management, Discharge Planning  PT  interventions Ambulation/gait training, Community reintegration, DME/adaptive equipment instruction, Neuromuscular re-education, Psychosocial support, Stair training, UE/LE Strength taining/ROM, Warden/ranger, Discharge planning, Functional electrical stimulation, Pain management, Skin care/wound management, Therapeutic Activities, UE/LE Coordination activities, Cognitive remediation/compensation, Disease management/prevention, Functional mobility training, Patient/family education, Splinting/orthotics, Therapeutic Exercise, Visual/perceptual remediation/compensation  OT Interventions Warden/ranger, Firefighter, Fish farm manager, Disease mangement/prevention, Discharge planning, Functional electrical stimulation, Functional mobility training, Neuromuscular re-education, Wheelchair propulsion/positioning, UE/LE Strength taining/ROM, Visual/perceptual remediation/compensation, Therapeutic Exercise, UE/LE Coordination activities, Therapeutic Activities, Self Care/advanced ADL retraining, Patient/family education, Splinting/orthotics, Pain management  SLP Interventions    TR Interventions    SW/CM Interventions Discharge Planning, Psychosocial Support, Patient/Family Education, Disease Management/Prevention   Barriers to Discharge MD  Medical stability and Weight  Nursing Decreased caregiver support, Home environment access/layout 14 ste to basement apt in mother's home, bil rails  PT Inaccessible home environment, Decreased caregiver support    OT      SLP      SW       Team Discharge Planning: Destination: PT-Home ,OT- Home , SLP-Home Projected Follow-up: PT-Outpatient PT, 24 hour supervision/assistance, OT-  Outpatient OT, 24 hour supervision/assistance, SLP-None Projected Equipment Needs: PT-To be determined, OT- To be determined, SLP-None recommended by SLP Equipment Details: PT- , OT-  Patient/family involved in discharge planning:  PT- Patient,  OT-Patient, SLP-Patient  MD ELOS: 12-14d Medical Rehab Prognosis:  Excellent Assessment: 50 year old right-handed female with history of hypertension hyperlipidemia type 2 diabetes mellitus.  Per chart review patient lives in an apartment in mothers basement.  Independent prior to admission.  She has 14 steps down to the living area..  She works full-time as a Engineer, site.  Presented 09/24/2020 after being found down for an extended amount of time with right side weakness as well as facial droop.  Cranial CT scan showed abnormal hypoattenuation within the left pons and left thalamus concerning for age-indeterminate infarct.  CT angiogram head and neck no emergent large vessel occlusion.  Patient did not receive tPA.  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  No hemorrhage or mass-effect.  Echocardiogram with ejection fraction of 60 to 65% grade 2 diastolic dysfunction.  No regional wall motion abnormalities.  Admission chemistries unremarkable except sodium 133, glucose 310, CK1 112 urinalysis positive nitrite.  Presently maintained on aspirin as well as Plavix for CVA prophylaxis x3 months then aspirin alone.  Hospital course patient did develop some right antecubital redness and swelling suspect thrombophlebitis placed on a 7-day course of Keflex.  Maintained on a regular consistency diet.  Therapy evaluations completed due to patient's right side weakness decreased functional mobility was admitted for a comprehensive rehab program.      See Team Conference Notes for weekly updates to the plan of care

## 2020-10-03 NOTE — Progress Notes (Signed)
Occupational Therapy Session Note  Patient Details  Name: Amber [name] MRN: [id number] Date of Birth: 10/08/1970  Today's Date: 10/03/2020 OT Individual Time: 4696-2952 OT Individual Time Calculation (min): 59 min    Short Term Goals: Week 1:  OT Short Term Goal 1 (Week 1): Pt will complete LB dressing with min assist sit to stand. OT Short Term Goal 2 (Week 1): Pt will complete all UB bathing with supervision in sitting. OT Short Term Goal 3 (Week 1): Pt will complete toilet transfers with min guard assist using the RW for support to the [facility]. OT Short Term Goal 4 (Week 1): Pt will use the RUE at an active assist level with supervision.  Skilled Therapeutic Interventions/Progress Updates:    Session 1: [phone]) Pt in wheelchair to start with transport down to the therapy gym for session.  She completed stand pivot transfers to the therapy mat with min assist.  Had her work in sitting and then in supine on RUE elbow extension and shoulder flexion while holding medium sized therapy ball.  She was able to hold the ball and work on small movements of shoulder flexion and extension with slight increased ataxia noted.  Min to mod assist to complete in sitting with increased right shoulder hike and left lean.  Transitioned to supine with in order to decrease shoulder hike and head lean to the right.  Also had her work on initiation and completion of rolling to both sides with emphasis on activation of the right trunk to assist with rolling to the left.  She also worked on transitions from sidelying to sit and back on the right side with min assist.  In sitting, also had her work on functional reach with the RUE to pick up and place small foam pieces in a cup.  Mod demonstrational cueing to avoid shoulder hike.  Returned back to the wheelchair at min assist with transition back to the room to complete session.  Pt was left with her significant other and with the safety belt in place.  Call button and  phone in reach.      Session 2: [phone])  Pt worked on toileting, bathing, and dressing during session.  Mod assist for functional mobility to the bathroom without assistive device to start.  She was able to complete toilet hygiene with min assist sit to stand and then transferred over to the tub bench at the same level.  Bathing was completed at min assist level sit to stand with min facilitation needed for integration of the LUE to wash 50% of her body.  Once dried off she worked on dressing sit to stand from the 3:1 as well as her wheelchair.  She donned her underpants with mod assist sit to stand and her pullover dress with setup assist.  She needed mod assist for her compression socks from home with supervision for slid on shoes.  She then completed grooming tasks in standing at the sink to brush her hair using the RUE.  Min guard assist for balance.  Finished session with pt resting in the wheelchair with her spouse present and with the call button and phone in reach.  Nursing made aware of rash located under pts left axilla.    Therapy Documentation Precautions:  Precautions Precautions: Fall, Other (comment) Precaution Comments: R hemiparesis Restrictions Weight Bearing Restrictions: No   Pain: Pain Assessment Pain Scale: Faces Pain Score: 0-No pain ADL: See Care Tool Section for some details of mobility and selfcare  Therapy/Group: Individual Therapy  Leigha Olberding OTR/L 10/03/2020, 12:14 PM

## 2020-10-03 NOTE — Progress Notes (Signed)
hysical Therapy Session Note  Patient Details  Name: Amber [name] MRN: [id number] Date of Birth: [birth year]  Today's Date: 10/03/2020 PT Individual Time: 0900-1005 and 1401-1432 PT Individual Time Calculation (min): 65 min and 31 min  Short Term Goals: Week 1:  PT Short Term Goal 1 (Week 1): Pt will perform supine<>sit supervision PT Short Term Goal 2 (Week 1): Pt will perform sit<>stands using LRAD with CGA PT Short Term Goal 3 (Week 1): Pt will perform bed<>chair transfers using LRAD with CGA PT Short Term Goal 4 (Week 1): Pt will ambulate at least 144ft using LRAD with min assist of 1 PT Short Term Goal 5 (Week 1): Pt will ascend/descend 4 steps using HRs with min assist of 1   Skilled Therapeutic Interventions/Progress Updates:  Session 1:  Patient supine in bed on entrance to room. Patient alert and agreeable to PT session. Patient denied pain during session.  Therapeutic Activity: Bed Mobility: Patient donned underpants while supine threading feet while hooklying with LE in Figure 4 positioning. Performed supine <> sit requiring vc/ tc for roll to sidelying R then push up to seated position EOB with Min/ mod A.  Transfers: Patient performed STS transfers during session improving from light Min A to supervision to no AD. SPVT transfers performed with Min A initially with light block to R knee to prevent knee block. Progresses to CGA throughout session. Provided verbal cues for maintaining R knee extension throughout.  Gait Training:  Patient ambulated 20' x1 with no AD and then continues on with light use of RW requiring CGA/ Min A throughout. Several standing rest breaks for regaining balance.  Intermittent Min A required for minor LOB d/t missteps into NBOS.  Provided vc/ tc for maintaining R knee extension during stance phase, widening step with RLE, and upright posture.  Neuromuscular Re-ed: NMR facilitated during session with focus on standing balance. Pt guided in performance  of Berg Balance test. Results below. Pt demos most difficulty with more dynamic aspects of testing and some safety awareness. NMR performed for improvements in motor control and coordination, balance, sequencing, judgement, and self confidence/ efficacy in performing all aspects of mobility at highest level of independence.   Patient seated upright  in w/c at end of session with brakes locked, belt alarm set, and all needs within reach. Steffanie Rainwater present on return to room.    Session 2: Patient seated in w/c on entrance to room. Fiancee present. Patient alert and agreeable to PT session. Patient denied pain during session.  Therapeutic Activity: Transfers: Patient performed STS transfers throughout with CGA/ supervision. Timed toileting notice on door noting next session at 2pm and pt provided with education re: timed toileting schedule and reeducation of bladder control. As pt is demonstrating good control of bladder and continence, she may be taken off of timed toileting schedule with nursing soon. Pt taken to bathroom.Toilet transfer performed with CGA using safety rails in bathroom. Clothing mgmt performed requiring extra time for R UE involvement and pt relating steps learned from OT session re: dressing. Performed with supervision. Provided verbal cues for maintaining R knee extension to prevent buckling throughout. Pericare with mod I.  Neuromuscular Re-ed: NMR facilitated during session with focus on motor control and muscle facilitation. Pt guided in minisquats with focus on eccentric control of quads in return into extension without uncontrolled hyperextension/ genu recurvatem. With vc and tc, pt able to improve demonstration of control throughout 3x10 reps with seated rest between bouts. Pt relates feeling of fatigue  but good strengthening following performance of minisquats. Discussion with pt re: performance of seated or supine LE and UE exercises to perform bilaterally at same time with mental  exercise of using both sides equally in order to retrain affected nerve pathways.   NMR performed for improvements in motor control and coordination, balance, sequencing, judgement, and self confidence/ efficacy in performing all aspects of mobility at highest level of independence.   Patient seated  in w/c at end of session with brakes locked, belt alarm set, and all needs within reach.   Therapy Documentation Precautions:  Precautions Precautions: Fall, Other (comment) Precaution Comments: R hemiparesis Restrictions Weight Bearing Restrictions: No Balance: Standardized Balance Assessment Standardized Balance Assessment: Berg Balance Test Berg Balance Test Sit to Stand: Able to stand using hands after several tries Standing Unsupported: Able to stand 2 minutes with supervision Sitting with Back Unsupported but Feet Supported on Floor or Stool: Able to sit 2 minutes under supervision Stand to Sit: Controls descent by using hands Transfers: Needs one person to assist Standing Unsupported with Eyes Closed: Able to stand 10 seconds with supervision Standing Ubsupported with Feet Together: Needs help to attain position but able to stand for 30 seconds with feet together From Standing, Reach Forward with Outstretched Arm: Can reach forward >12 cm safely (5") From Standing Position, Pick up Object from Floor: Unable to pick up and needs supervision From Standing Position, Turn to Look Behind Over each Shoulder: Looks behind one side only/other side shows less weight shift Turn 360 Degrees: Able to turn 360 degrees safely but slowly Standing Unsupported, Alternately Place Feet on Step/Stool: Able to complete >2 steps/needs minimal assist Standing Unsupported, One Foot in Front: Able to take small step independently and hold 30 seconds Standing on One Leg: Tries to lift leg/unable to hold 3 seconds but remains standing independently Total Score: 29  Therapy/Group: Individual  Therapy  Loel Dubonnet 10/03/2020, 1:01 PM

## 2020-10-03 NOTE — Progress Notes (Signed)
PROGRESS NOTE   Subjective/Complaints:  Working with physical therapy, no new complaints, reviewed lab work Patient very pleased to be here and feels like she is improving on a daily basis.  ROS- neg CP, SOB, N/V/D Objective:   No results found. Recent Labs    10/01/20 0908 10/02/20 0459  WBC 8.0 9.1  HGB 17.8* 14.8  HCT 54.3* 45.2  PLT 188 239    Recent Labs    10/01/20 0908 10/02/20 0459  NA 132* 135  K 4.0 3.9  CL 100 104  CO2 21* 23  GLUCOSE 250* 230*  BUN 16 18  CREATININE 0.54 0.51  CALCIUM 9.1 9.0     Intake/Output Summary (Last 24 hours) at 10/03/2020 1341 Last data filed at 10/03/2020 1303 Gross per 24 hour  Intake 1500 ml  Output --  Net 1500 ml         Physical Exam: Vital Signs Blood pressure (!) 153/82, pulse 86, temperature 98.2 F (36.8 C), temperature source Oral, resp. rate 17, height 5\' 10"  (1.778 m), weight 122.6 kg, SpO2 94 %.   General: No acute distress Mood and affect are appropriate Heart: Regular rate and rhythm no rubs murmurs or extra sounds Lungs: Clear to auscultation, breathing unlabored, no rales or wheezes Abdomen: Positive bowel sounds, soft nontender to palpation, nondistended Extremities: No clubbing, cyanosis, or edema Skin: No evidence of breakdown, no evidence of rash Neurologic: Cranial nerves II through XII intact, motor strength is 5/5 in left 4/5 RIght deltoid, bicep, tricep, grip, hip flexor, knee extensors, ankle dorsiflexor and plantar flexor Sensory exam normal sensation to light touch and proprioception in bilateral upper and lower extremities  Musculoskeletal: Full range of motion in all 4 extremities. No joint swelling    Assessment/Plan: 1. Functional deficits which require 3+ hours per day of interdisciplinary therapy in a comprehensive inpatient rehab setting. Physiatrist is providing close team supervision and 24 hour management of active  medical problems listed below. Physiatrist and rehab team continue to assess barriers to discharge/monitor patient progress toward functional and medical goals  Care Tool:  Bathing    Body parts bathed by patient: Right arm, Chest, Abdomen, Right upper leg, Left upper leg, Right lower leg, Left lower leg, Face   Body parts bathed by helper: Front perineal area, Buttocks, Left arm     Bathing assist Assist Level: Minimal Assistance - Patient > 75%     Upper Body Dressing/Undressing Upper body dressing   What is the patient wearing?: Pull over shirt    Upper body assist Assist Level: Moderate Assistance - Patient 50 - 74%    Lower Body Dressing/Undressing Lower body dressing      What is the patient wearing?: Pants, Incontinence brief     Lower body assist Assist for lower body dressing: Moderate Assistance - Patient 50 - 74%     Toileting Toileting    Toileting assist Assist for toileting: Contact Guard/Touching assist     Transfers Chair/bed transfer  Transfers assist     Chair/bed transfer assist level: Moderate Assistance - Patient 50 - 74%     Locomotion Ambulation   Ambulation assist      Assist  level: Moderate Assistance - Patient 50 - 74% Assistive device: No Device Max distance: 40ft   Walk 10 feet activity   Assist     Assist level: Moderate Assistance - Patient - 50 - 74% Assistive device: No Device   Walk 50 feet activity   Assist    Assist level: Moderate Assistance - Patient - 50 - 74% Assistive device: No Device    Walk 150 feet activity   Assist Walk 150 feet activity did not occur: Safety/medical concerns         Walk 10 feet on uneven surface  activity   Assist Walk 10 feet on uneven surfaces activity did not occur: Safety/medical concerns         Wheelchair     Assist Will patient use wheelchair at discharge?: No             Wheelchair 50 feet with 2 turns activity    Assist             Wheelchair 150 feet activity     Assist          Blood pressure (!) 153/82, pulse 86, temperature 98.2 F (36.8 C), temperature source Oral, resp. rate 17, height 5\' 10"  (1.778 m), weight 122.6 kg, SpO2 94 %.  Medical Problem List and Plan: 1.  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  Antithrombotics: -DVT/anticoagulation: SCDs             -antiplatelet therapy: Aspirin 81 mg daily and Plavix 75 mg daily x3 months then aspirin alone 3. Pain Management: Flexeril 5 mg 3 times daily as needed, Tylenol as needed 4. Mood: Zoloft 100 mg daily             -antipsychotic agents: N/A 5. Neuropsych: This patient is capable of making decisions on her own behalf. 6. Skin/Wound Care: Routine skin checks 7. Fluids/Electrolytes/Nutrition: Routine in and outs with follow-up chemistries 8.  Permissive hypertension.  Presently on HCTZ 12.5 mg daily.  Patient on Zestoretic 20-12.5 mg twice daily prior to admission.  Resume as needed Vitals:   10/03/20 0455 10/03/20 1240  BP: 127/66 (!) 153/82  Pulse: 69 86  Resp: 17 17  Temp: 97.6 F (36.4 C) 98.2 F (36.8 C)  SpO2: 98% 94%  Some lability on hydrochlorothiazide we will monitor prior to adding lisinopril 9.  Diabetes mellitus.  Hemoglobin A1c 11.7.  NovoLog 8 units 3 times daily, Lantus insulin 60 units daily, Glucotrol 5 mg daily.  Check blood sugars before meals and at bedtime CBG (last 3)  Recent Labs    10/02/20 2050 10/03/20 0609 10/03/20 1132  GLUCAP 152* 199* 172*   Elevated am CBG will increase Lantus to 70U 7/15  10.  Thrombophlebitis.  Complete course of Keflex 11.  Hyperlipidemia.  Lipitor  12.  On stroke /sleep apnea research trial   LOS: 2 days A FACE TO FACE EVALUATION WAS PERFORMED  8/15 10/03/2020, 1:41 PM

## 2020-10-04 LAB — GLUCOSE, CAPILLARY
Glucose-Capillary: 173 mg/dL — ABNORMAL HIGH (ref 70–99)
Glucose-Capillary: 88 mg/dL (ref 70–99)
Glucose-Capillary: 130 mg/dL — ABNORMAL HIGH (ref 70–99)
Glucose-Capillary: 120 mg/dL — ABNORMAL HIGH (ref 70–99)

## 2020-10-04 NOTE — Progress Notes (Signed)
Occupational Therapy Session Note  Patient Details  Name: Amber [name] MRN: [id number] Date of Birth: 03-10-1971  Today's Date: 10/04/2020 OT Individual Time: 0700-0730 OT Individual Time Calculation (min): 30 min   Today's Date: 10/04/2020 OT Individual Time: 1155-2080 OT Individual Time Calculation (min): 59 min   Short Term Goals: Week 1:  OT Short Term Goal 1 (Week 1): Pt will complete LB dressing with min assist sit to stand. OT Short Term Goal 2 (Week 1): Pt will complete all UB bathing with supervision in sitting. OT Short Term Goal 3 (Week 1): Pt will complete toilet transfers with min guard assist using the RW for support to the [facility]. OT Short Term Goal 4 (Week 1): Pt will use the RUE at an active assist level with supervision.  Skilled Therapeutic Interventions/Progress Updates:    Session1:  Pt received in bed with no pain reported.   ADL: Pt able ot reach RUE to 90* shoulder flexion without shoulder hike this date.  OT retrieves w/c cushion for w/c. Pt completes grooming at sink with improved use of RUE to unzip puse to get brush, reach for toothbrsuh and open toothpaste. Mild atazia noted with RUE reaching for cup to rinse mouth out. Pt reporting this increasing over last few days. Discussed implications and interventions. Pt completes footwear with supervision to don socks in figure 4 or hip hike on the bed and don shoes.  Pt left at end of session in w/c with exit alarm on, call light in reach and all needs met  Session2:  Pt received in w/c with no pain agreeable ot shower  ADL:  Pt completes bathing with CGA standing in shower for peri/buttock hygiene otherwise S using grab bar to steady self when reaching to feet. Pt able to wash hair with RUE this date Pt completes UB dressing with VC at sit to stand level with CGA using gravity to help pull dress down back Pt completes LB dressing with CGA for standing balance while advancing pants past hips fully with BUE  and increased time Pt completes footwear with supervision in seated firgure 4 Pt completes shower/Tub transfer with CGA for functional mobility with RW to/from shower with CGA overall.  Therapeutic activity Boc and blocks  RUE-21  LUE -46  Grip strength RUE- average 37.5 LUE- 67.5  Pt left at end of session in bed with exit alarm on, call light in reach and all needs met   Therapy Documentation Precautions:  Precautions Precautions: Fall, Other (comment) Precaution Comments: R hemiparesis Restrictions Weight Bearing Restrictions: No General:   Vital Signs: Therapy Vitals Temp: (!) 97.2 F (36.2 C) Pulse Rate: 73 Resp: 19 BP: 121/66 Patient Position (if appropriate): Lying Oxygen Therapy SpO2: 99 % O2 Device: Room Air Pain:   ADL: ADL Eating: Supervision/safety Where Assessed-Eating: Edge of bed Grooming: Minimal assistance Where Assessed-Grooming: Chair Upper Body Bathing: Minimal assistance Where Assessed-Upper Body Bathing: Multimedia programmer, Chair Lower Body Bathing: Minimal assistance Where Assessed-Lower Body Bathing: Shower Upper Body Dressing: Moderate assistance Where Assessed-Upper Body Dressing: Chair Lower Body Dressing: Moderate assistance Where Assessed-Lower Body Dressing: Chair, Sitting at sink, Standing at sink Toileting: Minimal assistance Where Assessed-Toileting: Bedside Commode Toilet Transfer: Moderate assistance Toilet Transfer Method: Counselling psychologist: Radiographer, therapeutic: Not assessed Social research officer, government: Moderate assistance Social research officer, government Method: Heritage manager: Radio broadcast assistant, Systems analyst    Praxis   Exercises:   Other Treatments:     Therapy/Group: Individual  Therapy  [name] 10/04/2020, 6:41 AM

## 2020-10-04 NOTE — Progress Notes (Signed)
Physical Therapy Session Note  Patient Details  Name: Amber [name] MRN: [id number] Date of Birth: 1971/02/26  Today's Date: 10/04/2020 PT Individual Time: 3875-6433 and 2951-8841 PT Individual Time Calculation (min): 56 min and 53 min  Short Term Goals: Week 1:  PT Short Term Goal 1 (Week 1): Pt will perform supine<>sit supervision PT Short Term Goal 2 (Week 1): Pt will perform sit<>stands using LRAD with CGA PT Short Term Goal 3 (Week 1): Pt will perform bed<>chair transfers using LRAD with CGA PT Short Term Goal 4 (Week 1): Pt will ambulate at least 19ft using LRAD with min assist of 1 PT Short Term Goal 5 (Week 1): Pt will ascend/descend 4 steps using HRs with min assist of 1  Skilled Therapeutic Interventions/Progress Updates:    Session 1: Pt received sitting on toilet with NT present and pt agreeable to therapy session. Therapist assumed care of pt - standing with CGA performed posterior peri-care set-up assist. L stand pivot transfer to w/c using L UE support on grab bar as needed with CGA. Seated in w/c performed hand hygiene.  Transported to/from gym in w/c for time management and energy conservation. Sit>stands, no AD, with CGA for steadying during session. Gait training 196ft, no AD, with min/light mod assist for balance - able to advance R LE during swing with only intermittent scissoring/narrow BOS causing slight R LOB and 2-3x R knee hyperextension during stance - reciprocal stepping pattern with decreased R stance time and decreased R hip extension. Sit<>stand to/from EOM, no UE support, with mirror feedback focusing on midline orientation to increase R LE WBing for NMR x10reps. Gait training ~67ft 2x to/from stairs, no AD, with min assist for balance - has a few more instances of knee hyperextension therefore pt compensates by keeping knee excessively flexed in a crouched gait. Stair navigation training using B UE support on B HRs via step-to pattern leading with L LE on ascent and  R LE on descent - pt maintains R knee in hyperextension to control knee stability during descent, unable to correct without risk of buckle - light mod assist for balance. Supine<>sit on mat with supervision. Supine bridging 2x15reps with cuing for increased R glute activation - demos good hip clearance from mat. Gait training ~61ft to // bars, no AD, with min assist for balance again 1x R knee hyperextension in stance. Standing R LE hamstring curls in // bars with B UE support 2x12reps. Standing in // bars with intermittent use of B UEs for balance as needed during x15reps mini-squats - min assist for balance and therapist positioning LE to prevent pt from squatting too low with risk of needing to sit. Repeated L LE forward foot taps on/off 6" step targeting R LE NMR and stance phase control - B UE support on // bars progressing to only R UE support - requires heavy min assist for balance with pt difficulty maintaining R knee extended without hyperextension or excessive flexion. Transported back to room and pt agreeable to remain sitting in w/c - left with needs in reach and seat belt alarm on.   Session 2: Pt received sitting in w/c with her mother, [name], present and pt excited to participate in therapy session.  Transported to/from gym in w/c for time management and energy conservation. Stepped on/off treadmill using B UE support on treadmill bars with min assist for balance and cuing for sequencing of LE stepping. Standing with UE supported donned litegait harness. Performed the following locomotor treadmill training trials:  1st: 8min20sec at 1.11mph totaling 246ft using B UE support - pt demos intermittent R toe catching during swing due to delayed ankle DF, reciprocal stepping pattern with cuing for increased L LE step length to prolong R stance phase, tactile cuing to avoid quick R knee extension/hyperextension during stance but cuing to still extend knee  2nd: 20min03sec at 1.89mph totaling 167ft  without B UE support - demos more impaired R LE coordination during swing resulting in varying foot positioning on initial contact requiring mod assist to facilitate improvement, demos shorter R stance phase resulting in decreased L step length, demos crouched gait on R LE during stance requiring cuing/facilitation to increase hip/knee extension  - Provided seated rest break 3rd: 67min03sec at 0.52mph (slower speed) \ totaling 286ft without UE support - demos improving R LE coordination during swing with more consistent placement though still requiring min/mod facilitation, demos improved R hip/knee extension during stance though requiring verbal/tactile cuing, improving symmetry in R/L stance time and step lengths - pt noted to have more control and improved gait mechanics at this slightly slower speed to allow time to execute movements  Stepped off treadmill and doffed litegait harness. Gait training ~184ft overground, no UE support, with mod assist for balance - anticipate pt experiencing fatigue due to more variable R LE gait mechanics with intermittent adduction causing narrow BOS and R lean, decreased R knee extension during stance (intermittent crouch walking) and overall increased postural sway requiring increased assistance for safety. Transported back towards room. Short distance ~66ft ambulatory transfer w/c>EOB, no AD, with heavy min assist for balance. Pt left sitting EOB with her mother and friend present and bed alarm on.   Therapy Documentation Precautions:  Precautions Precautions: Fall, Other (comment) Precaution Comments: R hemiparesis Restrictions Weight Bearing Restrictions: No   Pain:  Session 1: Denies pain during session.  Session 2: No reports of pain throughout session.   Therapy/Group: Individual Therapy  Ginny Forth , PT, DPT, NCS, CSRS 10/04/2020, 7:47 AM

## 2020-10-05 LAB — GLUCOSE, CAPILLARY
Glucose-Capillary: 141 mg/dL — ABNORMAL HIGH (ref 70–99)
Glucose-Capillary: 102 mg/dL — ABNORMAL HIGH (ref 70–99)
Glucose-Capillary: 144 mg/dL — ABNORMAL HIGH (ref 70–99)
Glucose-Capillary: 160 mg/dL — ABNORMAL HIGH (ref 70–99)

## 2020-10-05 NOTE — Progress Notes (Signed)
PROGRESS NOTE   Subjective/Complaints:  Pt reports no issue- rash on arm almost resolved- asked to go over her MRI.  Also now has strength to sit up- off therapy today- happy has a little break.    ROS- Pt denies SOB, abd pain, CP, N/V/C/D, and vision changes  Objective:   No results found. No results for input(s): WBC, HGB, HCT, PLT in the last 72 hours. No results for input(s): NA, K, CL, CO2, GLUCOSE, BUN, CREATININE, CALCIUM in the last 72 hours.  Intake/Output Summary (Last 24 hours) at 10/05/2020 1344 Last data filed at 10/05/2020 0819 Gross per 24 hour  Intake 480 ml  Output --  Net 480 ml        Physical Exam: Vital Signs Blood pressure (!) 122/58, pulse 74, temperature (!) 97.2 F (36.2 C), resp. rate 18, height 5\' 10"  (1.778 m), weight 122.6 kg, SpO2 98 %.    General: awake, alert, appropriate, sitting up in w/c at bedside; smiling; NAD HENT: conjugate gaze; oropharynx moist CV: regular rate; no JVD Pulmonary: CTA B/L; no W/R/R- good air movement GI: soft, NT, ND, (+)BS Psychiatric: appropriate; interactive Neurological: alert; asking in depth questions about MRI.   Extremities: No clubbing, cyanosis, or edema Skin: No evidence of breakdown, no evidence of rash Neurologic: Cranial nerves II through XII intact, motor strength is 5/5 in left 4/5 RIght deltoid, bicep, tricep, grip, hip flexor, knee extensors, ankle dorsiflexor and plantar flexor Sensory exam normal sensation to light touch and proprioception in bilateral upper and lower extremities  Musculoskeletal: Full range of motion in all 4 extremities. No joint swelling    Assessment/Plan: 1. Functional deficits which require 3+ hours per day of interdisciplinary therapy in a comprehensive inpatient rehab setting. Physiatrist is providing close team supervision and 24 hour management of active medical problems listed below. Physiatrist and rehab  team continue to assess barriers to discharge/monitor patient progress toward functional and medical goals  Care Tool:  Bathing    Body parts bathed by patient: Right arm, Chest, Abdomen, Right upper leg, Left upper leg, Right lower leg, Left lower leg, Face, Front perineal area, Buttocks   Body parts bathed by helper: Left arm     Bathing assist Assist Level: Minimal Assistance - Patient > 75%     Upper Body Dressing/Undressing Upper body dressing   What is the patient wearing?: Pull over shirt    Upper body assist Assist Level: Supervision/Verbal cueing    Lower Body Dressing/Undressing Lower body dressing      What is the patient wearing?: Underwear/pull up     Lower body assist Assist for lower body dressing: Moderate Assistance - Patient 50 - 74%     Toileting Toileting    Toileting assist Assist for toileting: Minimal Assistance - Patient > 75%     Transfers Chair/bed transfer  Transfers assist     Chair/bed transfer assist level: Minimal Assistance - Patient > 75%     Locomotion Ambulation   Ambulation assist      Assist level: Moderate Assistance - Patient 50 - 74% Assistive device: No Device Max distance: 130ft   Walk 10 feet activity   Assist  Assist level: Moderate Assistance - Patient - 50 - 74% Assistive device: No Device   Walk 50 feet activity   Assist    Assist level: Moderate Assistance - Patient - 50 - 74% Assistive device: No Device    Walk 150 feet activity   Assist Walk 150 feet activity did not occur: Safety/medical concerns         Walk 10 feet on uneven surface  activity   Assist Walk 10 feet on uneven surfaces activity did not occur: Safety/medical concerns         Wheelchair     Assist Will patient use wheelchair at discharge?: No             Wheelchair 50 feet with 2 turns activity    Assist            Wheelchair 150 feet activity     Assist          Blood  pressure (!) 122/58, pulse 74, temperature (!) 97.2 F (36.2 C), resp. rate 18, height 5\' 10"  (1.778 m), weight 122.6 kg, SpO2 98 %.  Medical Problem List and Plan: 1.  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  2.  Antithrombotics: -DVT/anticoagulation: SCDs             -antiplatelet therapy: Aspirin 81 mg daily and Plavix 75 mg daily x3 months then aspirin alone 3. Pain Management: Flexeril 5 mg 3 times daily as needed, Tylenol as needed  7/17- denies pain- con't regimen prn 4. Mood: Zoloft 100 mg daily             -antipsychotic agents: N/A 5. Neuropsych: This patient is capable of making decisions on her own behalf. 6. Skin/Wound Care: Routine skin checks 7. Fluids/Electrolytes/Nutrition: Routine in and outs with follow-up chemistries 8.  Permissive hypertension.  Presently on HCTZ 12.5 mg daily.  Patient on Zestoretic 20-12.5 mg twice daily prior to admission.  Resume as needed Vitals:   10/04/20 1928 10/05/20 0534  BP: 110/60 (!) 122/58  Pulse: 77 74  Resp: 18 18  Temp: 98.2 F (36.8 C) (!) 97.2 F (36.2 C)  SpO2: 98% 98%  Some lability on hydrochlorothiazide we will monitor prior to adding lisinopril  7/17- BP well controlled- 110s-120s systolic- con't regimen 9.  Diabetes mellitus.  Hemoglobin A1c 11.7.  NovoLog 8 units 3 times daily, Lantus insulin 60 units daily, Glucotrol 5 mg daily.  Check blood sugars before meals and at bedtime CBG (last 3)  Recent Labs    10/04/20 2057 10/05/20 0554 10/05/20 1145  GLUCAP 120* 141* 102*  Elevated am CBG will increase Lantus to 70U 7/15  7/17- BG's look great- con't regimen 10.  Thrombophlebitis.  Complete course of Keflex 11.  Hyperlipidemia.   Lipitor  12.  On stroke /sleep apnea research trial   LOS: 4 days A FACE TO FACE EVALUATION WAS PERFORMED  Amber [name] 10/05/2020, 1:44 PM

## 2020-10-06 LAB — GLUCOSE, CAPILLARY
Glucose-Capillary: 163 mg/dL — ABNORMAL HIGH (ref 70–99)
Glucose-Capillary: 157 mg/dL — ABNORMAL HIGH (ref 70–99)
Glucose-Capillary: 181 mg/dL — ABNORMAL HIGH (ref 70–99)
Glucose-Capillary: 113 mg/dL — ABNORMAL HIGH (ref 70–99)

## 2020-10-06 NOTE — Progress Notes (Signed)
Occupational Therapy Session Note  Patient Details  Name: Amber [name] MRN: [id number] Date of Birth: [birth year]  Today's Date: 10/06/2020 OT Individual Time: 1345-1445 OT Individual Time Calculation (min): 60 min   Short Term Goals: Week 1:  OT Short Term Goal 1 (Week 1): Pt will complete LB dressing with min assist sit to stand. OT Short Term Goal 2 (Week 1): Pt will complete all UB bathing with supervision in sitting. OT Short Term Goal 3 (Week 1): Pt will complete toilet transfers with min guard assist using the RW for support to the [facility]. OT Short Term Goal 4 (Week 1): Pt will use the RUE at an active assist level with supervision.  Skilled Therapeutic Interventions/Progress Updates:    Pt greeted in the w/c with no c/o pain, very motivated to participate. Started with using MHP combined with gentle AROM as a preparatory activity for the Rt UE. Pt then participated in keyboarding task, education provided on slow, mindful typing, and quality of movement vs quantity. Note that she had a difficult time typing out her name, though able to do this unassisted and while using both hands. Discussed evidence-based visualization techniques and how to incorporate visualization into daily routine/before sleeping. Next guided her through mirror therapy exercises and also grasp/release task using Saebo Stim One. Pt tolerated estim for ~15 minutes during activity. Skin intact post tx. Parameters listed below. Pt very inquisitive throughout tx, motivated to participate and regain functional use of her Rt hand. At end of tx pt remained sitting up in the w/c, all needs within reach.   Saebo Stim One 330 pulse width 35 Hz pulse rate On 8 sec/ off 8 sec Ramp up/ down 2 sec Symmetrical Biphasic wave form  Max intensity at 500 Ohm load   Therapy Documentation Precautions:  Precautions Precautions: Fall, Other (comment) Precaution Comments: R hemiparesis Restrictions Weight Bearing Restrictions:  No  ADL: ADL Eating: Supervision/safety Where Assessed-Eating: Edge of bed Grooming: Minimal assistance Where Assessed-Grooming: Chair Upper Body Bathing: Minimal assistance Where Assessed-Upper Body Bathing: Shower, Chair Lower Body Bathing: Minimal assistance Where Assessed-Lower Body Bathing: Shower Upper Body Dressing: Moderate assistance Where Assessed-Upper Body Dressing: Chair Lower Body Dressing: Moderate assistance Where Assessed-Lower Body Dressing: Chair, Sitting at sink, Standing at sink Toileting: Minimal assistance Where Assessed-Toileting: Bedside Commode Toilet Transfer: Moderate assistance Toilet Transfer Method: Proofreader: Gaffer: Not assessed Film/video editor: Moderate assistance Film/video editor Method: Designer, industrial/product: Emergency planning/management officer, Grab bars      Therapy/Group: Individual Therapy  Amber [name] 10/06/2020, 3:55 PM

## 2020-10-06 NOTE — Progress Notes (Signed)
Physical Therapy Session Note  Patient Details  Name: Amber [name] MRN: [id number] Date of Birth: 01/24/1971  Today's Date: 10/06/2020 PT Individual Time: 1502-1600 PT Individual Time Calculation (min): 58 min   Short Term Goals: Week 1:  PT Short Term Goal 1 (Week 1): Pt will perform supine<>sit supervision PT Short Term Goal 2 (Week 1): Pt will perform sit<>stands using LRAD with CGA PT Short Term Goal 3 (Week 1): Pt will perform bed<>chair transfers using LRAD with CGA PT Short Term Goal 4 (Week 1): Pt will ambulate at least 149ft using LRAD with min assist of 1 PT Short Term Goal 5 (Week 1): Pt will ascend/descend 4 steps using HRs with min assist of 1  Skilled Therapeutic Interventions/Progress Updates:    Patient in w/c in room.  Reports events of previous sessions.  Requests to work on R side.  Patient assisted in w/c to therapy gym.  In parallel bars performed L step taps to 4" step while in stance on R with orange t-band around knee for proprioception and to prevent recurvatum.  Alternate lunges onto step forward with A for R placement and cue into quad for stability.  Side steps up and over 4" step holding rail with assist for R hip stability in stance, cue for R foot placement and knee control.  Side stepping in parallel bars with orange t-band around knees with UE support and facilitation into R hip in stance and cue for picking up feet.  Patient ambulated x 100' with RW and min A with facilitation into R hip in stance and cues for shorter strides while using walker.  Seated for hamstring curls x 2 x 10 with orange t-band and cues for eccentric control when extending  back out.  Standing hip extension x 12 x 2 sets with orange t-band for resistance and min A for balance.  Patient assisted in w/c to room.  Left in chair with alarm belt active and needs in reach.  Therapy Documentation Precautions:  Precautions Precautions: Fall, Other (comment) Precaution Comments: R  hemiparesis Restrictions Weight Bearing Restrictions: No  Pain: Pain Assessment Pain Score: 0-No pain    Therapy/Group: Individual Therapy  Elray Mcgregor [place], [place] 10/06/2020, 6:13 PM

## 2020-10-06 NOTE — Progress Notes (Signed)
Occupational Therapy Session Note  Patient Details  Name: Amber [name] MRN: [id number] Date of Birth: 07-20-1970  Today's Date: 10/06/2020 OT Individual Time: 1106-1200 OT Individual Time Calculation (min): 54 min    Short Term Goals: Week 1:  OT Short Term Goal 1 (Week 1): Pt will complete LB dressing with min assist sit to stand. OT Short Term Goal 2 (Week 1): Pt will complete all UB bathing with supervision in sitting. OT Short Term Goal 3 (Week 1): Pt will complete toilet transfers with min guard assist using the RW for support to the [facility]. OT Short Term Goal 4 (Week 1): Pt will use the RUE at an active assist level with supervision.  Skilled Therapeutic Interventions/Progress Updates:    Pt was transported down to the therapy gym where she completed transition into quadriped with mod assist.  Worked on RUE weightbearing from this position with min assist to maintain right elbow extension, while washing therapy mat and placing pegs in the peg board.  She was able to rest in tall kneeling with min assist as well as in left sidelying.  Mod assist for transitions from sidelying back to quadriped.  She was able to transition back to sitting EOM with min assist and worked on RUE coordination placing 2' wooden pegs in the peg board, one at a time.  She exhibits increased ataxia but was successful with increased time at 95% level of accuracy.  Had her remove pegs by picking them up one at a time and manipulating them into the palm of her hand to place them back in the container.  No drops noted with this.  Min guard assist for stand pivot transfer back to the wheelchair with return to the room.  She was left sitting up with the safety belt in place and her call button in reach.  Her mother was present as well.    Therapy Documentation Precautions:  Precautions Precautions: Fall, Other (comment) Precaution Comments: R hemiparesis Restrictions Weight Bearing Restrictions: No  Pain: Pain  Assessment Pain Scale: Faces Pain Score: 0-No pain ADL: See Care Tool Section for some details of mobility and selfcare  Therapy/Group: Individual Therapy  Norfleet Capers OTR/L 10/06/2020, 12:51 PM

## 2020-10-06 NOTE — Progress Notes (Signed)
PROGRESS NOTE   Subjective/Complaints:  Discussed return to work goals , would like to go to orientation next week , suggested practice on keyboard   ROS- Pt denies SOB, abd pain, CP, N/V/C/D, and vision changes  Objective:   No results found. No results for input(s): WBC, HGB, HCT, PLT in the last 72 hours. No results for input(s): NA, K, CL, CO2, GLUCOSE, BUN, CREATININE, CALCIUM in the last 72 hours.  Intake/Output Summary (Last 24 hours) at 10/06/2020 1004 Last data filed at 10/06/2020 0831 Gross per 24 hour  Intake 834 ml  Output --  Net 834 ml         Physical Exam: Vital Signs Blood pressure 128/70, pulse 71, temperature 97.9 F (36.6 C), temperature source Oral, resp. rate 14, height 5\' 10"  (1.778 m), weight 122.6 kg, SpO2 98 %.      Extremities: No clubbing, cyanosis, or edema Skin: No evidence of breakdown, no evidence of rash Neurologic: Cranial nerves II through XII intact, motor strength is 5/5 in left 4/5 RIght deltoid, bicep, tricep, grip, hip flexor, knee extensors, ankle dorsiflexor and plantar flexor Sensory exam normal sensation to light touch and proprioception in bilateral upper and lower extremities  Musculoskeletal: Full range of motion in all 4 extremities. No joint swelling    Assessment/Plan: 1. Functional deficits which require 3+ hours per day of interdisciplinary therapy in a comprehensive inpatient rehab setting. Physiatrist is providing close team supervision and 24 hour management of active medical problems listed below. Physiatrist and rehab team continue to assess barriers to discharge/monitor patient progress toward functional and medical goals  Care Tool:  Bathing    Body parts bathed by patient: Right arm, Chest, Abdomen, Right upper leg, Left upper leg, Right lower leg, Left lower leg, Face, Front perineal area, Buttocks   Body parts bathed by helper: Left arm      Bathing assist Assist Level: Minimal Assistance - Patient > 75%     Upper Body Dressing/Undressing Upper body dressing   What is the patient wearing?: Pull over shirt    Upper body assist Assist Level: Minimal Assistance - Patient > 75%    Lower Body Dressing/Undressing Lower body dressing      What is the patient wearing?: Underwear/pull up     Lower body assist Assist for lower body dressing: Moderate Assistance - Patient 50 - 74%     Toileting Toileting    Toileting assist Assist for toileting: Moderate Assistance - Patient 50 - 74%     Transfers Chair/bed transfer  Transfers assist     Chair/bed transfer assist level: Minimal Assistance - Patient > 75%     Locomotion Ambulation   Ambulation assist      Assist level: Moderate Assistance - Patient 50 - 74% Assistive device: No Device Max distance: 13ft   Walk 10 feet activity   Assist     Assist level: Moderate Assistance - Patient - 50 - 74% Assistive device: No Device   Walk 50 feet activity   Assist    Assist level: Moderate Assistance - Patient - 50 - 74% Assistive device: No Device    Walk 150 feet activity  Assist Walk 150 feet activity did not occur: Safety/medical concerns         Walk 10 feet on uneven surface  activity   Assist Walk 10 feet on uneven surfaces activity did not occur: Safety/medical concerns         Wheelchair     Assist Will patient use wheelchair at discharge?: No             Wheelchair 50 feet with 2 turns activity    Assist            Wheelchair 150 feet activity     Assist          Blood pressure 128/70, pulse 71, temperature 97.9 F (36.6 C), temperature source Oral, resp. rate 14, height 5\' 10"  (1.778 m), weight 122.6 kg, SpO2 98 %.  Medical Problem List and Plan: 1.  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  Antithrombotics: -DVT/anticoagulation: SCDs             -antiplatelet therapy: Aspirin 81 mg daily and Plavix 75 mg daily x3 months then aspirin alone 3. Pain Management: Flexeril 5 mg 3 times daily as needed, Tylenol as needed  7/17- denies pain- con't regimen prn 4. Mood: Zoloft 100 mg daily             -antipsychotic agents: N/A 5. Neuropsych: This patient is capable of making decisions on her own behalf. 6. Skin/Wound Care: Routine skin checks 7. Fluids/Electrolytes/Nutrition: Routine in and outs with follow-up chemistries 8.  Permissive hypertension.  Presently on HCTZ 12.5 mg daily.  Patient on Zestoretic 20-12.5 mg twice daily prior to admission.  Resume as needed Vitals:   10/05/20 1942 10/06/20 0448  BP: 133/76 128/70  Pulse: 80 71  Resp: 14 14  Temp: 98.5 F (36.9 C) 97.9 F (36.6 C)  SpO2: 98% 98%  Controlled 7/18 9.  Diabetes mellitus.  Hemoglobin A1c 11.7.  NovoLog 8 units 3 times daily, Lantus insulin 60 units daily, Glucotrol 5 mg daily.  Check blood sugars before meals and at bedtime CBG (last 3)  Recent Labs    10/05/20 1657 10/05/20 2123 10/06/20 0653  GLUCAP 144* 160* 163*     Controlled 7/18 10.  Thrombophlebitis.  Complete course of Keflex 11.  Hyperlipidemia.  Lipitor  12.  On stroke /sleep apnea research trial   LOS: 5 days A FACE TO FACE EVALUATION WAS PERFORMED  8/18 10/06/2020, 10:04 AM

## 2020-10-06 NOTE — Progress Notes (Signed)
Occupational Therapy Session Note  Patient Details  Name: Amber [name] MRN: [id number] Date of Birth: December 17, 1970  Today's Date: 10/06/2020 OT Individual Time: 9597-4718 OT Individual Time Calculation (min): 45 min    Short Term Goals: Week 1:  OT Short Term Goal 1 (Week 1): Pt will complete LB dressing with min assist sit to stand. OT Short Term Goal 2 (Week 1): Pt will complete all UB bathing with supervision in sitting. OT Short Term Goal 3 (Week 1): Pt will complete toilet transfers with min guard assist using the RW for support to the [facility]. OT Short Term Goal 4 (Week 1): Pt will use the RUE at an active assist level with supervision.  Skilled Therapeutic Interventions/Progress Updates:    OT intervention with focus on functional amb with RW, standing balance, bathing at shower level, dressing with sit<>stand from w/c, RUE functional use, safety awareness, and activity tolerance to increase independence with BADLs. Functional amb with RW at [facility]. Sit<>stand and standing balance with supervsion. BAthing with supervision. Pt required min A to pull pants over Rt hip. Pt pleased with improved funcitonal use of RUE during bating/dressing tasks. Pt remained in w/c with all needs within reach and belt alarm activated.   Therapy Documentation Precautions:  Precautions Precautions: Fall, Other (comment) Precaution Comments: R hemiparesis Restrictions Weight Bearing Restrictions: No   Pain: Pain Assessment Pain Scale: 0-10 Pain Score: 0-No pain   Therapy/Group: Individual Therapy  Rich Brave 10/06/2020, 10:31 AM

## 2020-10-07 LAB — GLUCOSE, CAPILLARY
Glucose-Capillary: 167 mg/dL — ABNORMAL HIGH (ref 70–99)
Glucose-Capillary: 117 mg/dL — ABNORMAL HIGH (ref 70–99)
Glucose-Capillary: 140 mg/dL — ABNORMAL HIGH (ref 70–99)
Glucose-Capillary: 196 mg/dL — ABNORMAL HIGH (ref 70–99)

## 2020-10-07 NOTE — Progress Notes (Signed)
Physical Therapy Session Note  Patient Details  Name: Amber [name] MRN: [id number] Date of Birth: 02-Oct-1970  Today's Date: 10/07/2020 PT Individual Time: 3151-7616; 0737-1062 PT Individual Time Calculation (min): 60 min; 40 minutes   Short Term Goals: Week 1:  PT Short Term Goal 1 (Week 1): Pt will perform supine<>sit supervision PT Short Term Goal 2 (Week 1): Pt will perform sit<>stands using LRAD with CGA PT Short Term Goal 3 (Week 1): Pt will perform bed<>chair transfers using LRAD with CGA PT Short Term Goal 4 (Week 1): Pt will ambulate at least 191ft using LRAD with min assist of 1 PT Short Term Goal 5 (Week 1): Pt will ascend/descend 4 steps using HRs with min assist of 1   Skilled Therapeutic Interventions/Progress Updates:    First session: Pt received sitting in wc and agreeable to physical therapy. Gait belt placed prior to initiation of mobility.  Pt transferred to mat table for tall kneeling activities with MinA. Blue therapy ball placed on mat table for UE support and additional element of dynamic UE control. Tall kneeling squats 2x10. Verbal and tactile cuing for increased recruitment of glutes. PNF D2 1x10 for BUE with opposite UE support/dynamic control on therapy ball. Verbal cuing for sequencing with D2 pattern, rotation, and glute activation throughout.  ModA +2 to transfer off mat table with instance of RLE knee buckling/LE sliding posteriorly. Pt demonstrated ability to reposition BLE underneath her and utilize BUE to assist in pushing to standing with no extra assist from therapist.   BLE alternating mini-lunges CGA for balancing. Lateral and then forward x10 each direction. Verbal cuing to gain balance in standing in between lunges.  Dynamic reaching activity with #1-6 sticky notes ranging over head to hip height CGA. Pt demonstrated ability to mini-lunge step towards/back from sticky notes and reach to various heights/angles. Pt able to randomize stepping LE and  reaching UE bilaterally. One instance of LOB towards the right with pt performing stepping strategy crossing LLE over RLE and uncrossing legs to recover balance without extra assistance from therapist.   Pt sitting in wc with brakes locked, seatbelt alarm on, and call bell within reach. No further needs expressed at this time.   Second Session:  Pt received sitting in wc and agreeable to physical therapy. Gait belt placed prior to initiation of mobility.  Transfers: Wc>Bed stand pivot transfer CGA-Close supervision. Sitting>Supine supervision.    Gait training ~158ft RW with CGA for steadying. Verbal cuing for posture, intermittent RW management due to decreased push with RUE compared to LUE, and for directions to day room. Pt demonstrates decreased stance time RLE, occasional hyperextension with RLE during stance > push off phase and RLE adduction during swing phase.   Dynamic reaching activity CGA with #1-6 sticky notes continued from first session. Sticky notes randomized from earlier with change in configuration to a complete circle.   Lateral step mini-lunges completed towards L/R CGA ~30ft x2. Increased stance time on LLE. Decreased eccentric control stepping with RLE and right lateral lean secondary to weak glutes. Pt demonstrates ability to prevent RLE hyperextension during activity.   Pt requested activities to do in bed on her down time. Pt education on different bed level strengthening and NMR exercises and allowing for rest time to recover.   Pt supine in bed with HOB elevated, bed alarm on, and call bell within reach. No further needs expressed at this time.   Therapy Documentation Precautions:  Precautions Precautions: Fall, Other (comment) Precaution Comments: R hemiparesis  Restrictions Weight Bearing Restrictions: No  Pain: Pain Assessment Pain Scale: 0-10 Pain Score: 0-No pain    Therapy/Group: Individual Therapy  Kyndal Heringer, SPT 10/07/2020, 12:36 PM

## 2020-10-07 NOTE — Progress Notes (Addendum)
Occupational Therapy Session Note  Patient Details  Name: Amber [name] MRN: [id number] Date of Birth: 07/23/1970  Today's Date: 10/07/2020 OT Individual Time: 5400-8676 OT Individual Time Calculation (min): 54 min    Short Term Goals: Week 1:  OT Short Term Goal 1 (Week 1): Pt will complete LB dressing with min assist sit to stand. OT Short Term Goal 2 (Week 1): Pt will complete all UB bathing with supervision in sitting. OT Short Term Goal 3 (Week 1): Pt will complete toilet transfers with min guard assist using the RW for support to the [facility]. OT Short Term Goal 4 (Week 1): Pt will use the RUE at an active assist level with supervision.  Skilled Therapeutic Interventions/Progress Updates:    Session 1: ([id number])  Pt worked on shower and dressing during session.  She was able to ambulate around the room without use of an assistive device and min assist.  Decreased right hip and knee control noted in stance phase during mobilization.  She was able to complete bathing at min guard assist level sit to stand.  RUE was incorporated at an active assist level for bathing throughout as well as dressing.  She was able to transfer out to the EOB with min assist in order to complete dressing tasks.  Supervision for UB dressing with min guard for LB dressing sit to stand.  She was then able to brush her hair with setup as well from the EOB.  Had her ambulate at min assist level to clean up clothing at end of session.  She was left in the wheelchair at end of session with safety belt in place and with the call button and phone in reach.    Session 2: [phone])  Pt up in wheelchair eager for therapy.  She was able to work on standing balance as well as RUE strengthening with use of the Wii.  Incorporated functional reaching with the RUE for the controller as well as activation and playing of the bowling and boxing activities.  Also had her work on weightshifts to the right while standing with stepping  forward with the RLE when taking her turn.  Min assist needed to maintain balance at times secondary to LOB to the right.  She exhibited increased difficulty maintaining shoulder flexion with completion of boxing game, demonstrating increased compensation with shoulder hike as well as some left lean.  Progressed to completion of Nine Hole Peg Test.  She was able to complete this in 30 seconds with the left hand and 98 seconds with the right from seated position.  Returned to the room via wheelchair at end of session with call button and phone in reach and safety belt in place.  Pt encouraged to continue working on gross motor and FM exercises this evening.    Therapy Documentation Precautions:  Precautions Precautions: Fall, Other (comment) Precaution Comments: R hemiparesis Restrictions Weight Bearing Restrictions: No  Pain: Pain Assessment Pain Scale: Faces Pain Score: 0-No pain ADL: See Care Tool Section for some details of mobility and selfcare   Therapy/Group: Individual Therapy  Nehemias Sauceda OTR/L 10/07/2020, 12:41 PM

## 2020-10-07 NOTE — Progress Notes (Signed)
PROGRESS NOTE   Subjective/Complaints: Feels like she can move RIght fingers better today , tried keyboarding yesterday , went slowly but encouraged   ROS- Pt denies SOB, abd pain, CP, N/V/C/D, and vision changes  Objective:   No results found. No results for input(s): WBC, HGB, HCT, PLT in the last 72 hours. No results for input(s): NA, K, CL, CO2, GLUCOSE, BUN, CREATININE, CALCIUM in the last 72 hours.  Intake/Output Summary (Last 24 hours) at 10/07/2020 0813 Last data filed at 10/06/2020 1759 Gross per 24 hour  Intake 1074 ml  Output --  Net 1074 ml         Physical Exam: Vital Signs Blood pressure 139/77, pulse 62, temperature 98.2 F (36.8 C), resp. rate 14, height 5\' 10"  (1.778 m), weight 122.6 kg, SpO2 98 %.     General: No acute distress Mood and affect are appropriate Heart: Regular rate and rhythm no rubs murmurs or extra sounds Lungs: Clear to auscultation, breathing unlabored, no rales or wheezes Abdomen: Positive bowel sounds, soft nontender to palpation, nondistended Extremities: No clubbing, cyanosis, or edema Skin: No evidence of breakdown, no evidence of rash  Extremities: No clubbing, cyanosis, or edema Skin: No evidence of breakdown, no evidence of rash Neurologic: Cranial nerves II through XII intact, motor strength is 5/5 in left 4/5 RIght deltoid, bicep, tricep, grip, hip flexor, knee extensors, ankle dorsiflexor and plantar flexor Sensory exam normal sensation to light touch and proprioception in bilateral upper and lower extremities  Musculoskeletal: Full range of motion in all 4 extremities. No joint swelling    Assessment/Plan: 1. Functional deficits which require 3+ hours per day of interdisciplinary therapy in a comprehensive inpatient rehab setting. Physiatrist is providing close team supervision and 24 hour management of active medical problems listed below. Physiatrist and rehab  team continue to assess barriers to discharge/monitor patient progress toward functional and medical goals  Care Tool:  Bathing    Body parts bathed by patient: Right arm, Chest, Abdomen, Right upper leg, Left upper leg, Right lower leg, Left lower leg, Face, Front perineal area, Buttocks, Left arm   Body parts bathed by helper: Left arm     Bathing assist Assist Level: Supervision/Verbal cueing     Upper Body Dressing/Undressing Upper body dressing   What is the patient wearing?: Pull over shirt    Upper body assist Assist Level: Supervision/Verbal cueing    Lower Body Dressing/Undressing Lower body dressing      What is the patient wearing?: Underwear/pull up, Pants     Lower body assist Assist for lower body dressing: Minimal Assistance - Patient > 75%     Toileting Toileting    Toileting assist Assist for toileting: Moderate Assistance - Patient 50 - 74%     Transfers Chair/bed transfer  Transfers assist     Chair/bed transfer assist level: Minimal Assistance - Patient > 75%     Locomotion Ambulation   Ambulation assist      Assist level: Moderate Assistance - Patient 50 - 74% Assistive device: No Device Max distance: 133ft   Walk 10 feet activity   Assist     Assist level: Moderate Assistance - Patient -  50 - 74% Assistive device: No Device   Walk 50 feet activity   Assist    Assist level: Moderate Assistance - Patient - 50 - 74% Assistive device: No Device    Walk 150 feet activity   Assist Walk 150 feet activity did not occur: Safety/medical concerns         Walk 10 feet on uneven surface  activity   Assist Walk 10 feet on uneven surfaces activity did not occur: Safety/medical concerns         Wheelchair     Assist Will patient use wheelchair at discharge?: No             Wheelchair 50 feet with 2 turns activity    Assist            Wheelchair 150 feet activity     Assist           Blood pressure 139/77, pulse 62, temperature 98.2 F (36.8 C), resp. rate 14, height 5\' 10"  (1.778 m), weight 122.6 kg, SpO2 98 %.  Medical Problem List and Plan: 1.  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  Antithrombotics: -DVT/anticoagulation: SCDs             -antiplatelet therapy: Aspirin 81 mg daily and Plavix 75 mg daily x3 months then aspirin alone 3. Pain Management: Flexeril 5 mg 3 times daily as needed, Tylenol as needed  7/17- denies pain- con't regimen prn 4. Mood: Zoloft 100 mg daily             -antipsychotic agents: N/A 5. Neuropsych: This patient is capable of making decisions on her own behalf. 6. Skin/Wound Care: Routine skin checks 7. Fluids/Electrolytes/Nutrition: Routine in and outs with follow-up chemistries 8.  Permissive hypertension.  Presently on HCTZ 12.5 mg daily.  Patient on Zestoretic 20-12.5 mg twice daily prior to admission.  Resume as needed Vitals:   10/06/20 1956 10/07/20 0532  BP: (!) 141/76 139/77  Pulse: 79 62  Resp: 14 14  Temp: 97.8 F (36.6 C) 98.2 F (36.8 C)  SpO2: 99% 98%  Controlled 7/19 9.  Diabetes mellitus.  Hemoglobin A1c 11.7.  NovoLog 8 units 3 times daily, Lantus insulin 60 units daily, Glucotrol 5 mg daily.  Check blood sugars before meals and at bedtime CBG (last 3)  Recent Labs    10/06/20 1636 10/06/20 2119 10/07/20 0652  GLUCAP 181* 113* 167*     Controlled 7/19 10.  Thrombophlebitis. Resolved  Complete course of Keflex 11.  Hyperlipidemia.  Lipitor  12.  On stroke /sleep apnea research trial   LOS: 6 days A FACE TO FACE EVALUATION WAS PERFORMED  8/19 10/07/2020, 8:13 AM

## 2020-10-08 LAB — GLUCOSE, CAPILLARY
Glucose-Capillary: 170 mg/dL — ABNORMAL HIGH (ref 70–99)
Glucose-Capillary: 174 mg/dL — ABNORMAL HIGH (ref 70–99)
Glucose-Capillary: 165 mg/dL — ABNORMAL HIGH (ref 70–99)
Glucose-Capillary: 209 mg/dL — ABNORMAL HIGH (ref 70–99)
Glucose-Capillary: 186 mg/dL — ABNORMAL HIGH (ref 70–99)

## 2020-10-08 MED ORDER — INSULIN ASPART 100 UNIT/ML IJ SOLN
9.0000 [IU] | Freq: Three times a day (TID) | INTRAMUSCULAR | Status: DC
  Administered 2020-10-08 (×2): 9 [IU] via SUBCUTANEOUS

## 2020-10-08 NOTE — Progress Notes (Signed)
Physical Therapy Session Note  Patient Details  Name: Amber [name] MRN: [id number] Date of Birth: [birth year]  Today's Date: 10/08/2020 PT Individual Time: 1335-1430 PT Individual Time Calculation (min): 55 min   Short Term Goals: Week 1:  PT Short Term Goal 1 (Week 1): Pt will perform supine<>sit supervision PT Short Term Goal 2 (Week 1): Pt will perform sit<>stands using LRAD with CGA PT Short Term Goal 3 (Week 1): Pt will perform bed<>chair transfers using LRAD with CGA PT Short Term Goal 4 (Week 1): Pt will ambulate at least 157ft using LRAD with min assist of 1 PT Short Term Goal 5 (Week 1): Pt will ascend/descend 4 steps using HRs with min assist of 1  Skilled Therapeutic Interventions/Progress Updates:  Patient seatedupright in w/c on entrance to room. Patient alert and agreeable to PT session. Patient with no pain complaint throughout session.  Therapeutic Activity: Transfers: Patient performed STS transfers throughout session with close supervision and focus on no UE support for hands on thighs. SPVT performed with light CGA for gentle cueing in postioning.  Provided verbal cues for maintaining strong knee strength with decreased uncontrolled hyperextension.  Gait Training:  Patient ambulated 48' x4 with no AD and CGA with focus on controlling R knee extension to prevent hyperextension. Demonstrated good effort and knee falling into genu recurvatum positioning uncontrolled ~25% of bouts. Provided vc/ tc for feedback on good/ neutral extension during stance phase.  Pt also completes stair training with visual demo along with verbal instruction re: leading LE during ascent/ descent. Pt is able to perform correctly with step-to gait pattern throughout and close CGA. Very close supervision (standby guard) to R knee  during descent with no demo of buckling but ~50% demo of hyperextension. Completes 3 bouts of four 6" steps with CGA overall.  Neuromuscular Re-ed: NMR facilitated during  session with focus on standing balance, trunk control, RUE global mobility/ motor control. Pt guided in seated and standing ball bounce toss from chest height and with arms extended for overhead height requiring reach in and outside of BOS with good reactive and proactive balance demonstrated. Pt also guided in seated and standing double sided paddle row using 3# weighted bar. RUE with difficulty when fatiguing but pt continues to provide good effort throughout. No LOB noted. NMR performed for improvements in motor control and coordination, balance, sequencing, judgement, and self confidence/ efficacy in performing all aspects of mobility at highest level of independence.   Patient seated  in w/c at end of session with brakes locked, belt alarm set, and all needs within reach.     Therapy Documentation Precautions:  Precautions Precautions: Fall, Other (comment) Precaution Comments: R hemiparesis Restrictions Weight Bearing Restrictions: No  Therapy/Group: Individual Therapy  Loel Dubonnet PT, DPT 10/08/2020, 5:14 PM

## 2020-10-08 NOTE — Progress Notes (Addendum)
PROGRESS NOTE   Subjective/Complaints:  No issues overnite , amb to gym with therapy yesterday , no pains  ROS- Pt denies SOB, abd pain, CP, N/V/C/D, and vision changes  Objective:   No results found. No results for input(s): WBC, HGB, HCT, PLT in the last 72 hours. No results for input(s): NA, K, CL, CO2, GLUCOSE, BUN, CREATININE, CALCIUM in the last 72 hours.  Intake/Output Summary (Last 24 hours) at 10/08/2020 0827 Last data filed at 10/07/2020 1935 Gross per 24 hour  Intake 418 ml  Output --  Net 418 ml         Physical Exam: Vital Signs Blood pressure 122/73, pulse 63, temperature 97.6 F (36.4 C), resp. rate 20, height _0  (1.778 m), weight 122.6 kg, SpO2 96 %.  General: No acute distress Mood and affect are appropriate Heart: Regular rate and rhythm no rubs murmurs or extra sounds Lungs: Clear to auscultation, breathing unlabored, no rales or wheezes Abdomen: Positive bowel sounds, soft nontender to palpation, nondistended  Extremities: No clubbing, cyanosis, or edema Skin: No evidence of breakdown, no evidence of rash Neurologic: Cranial nerves II through XII intact, motor strength is 5/5 in left 4/5 RIght deltoid, bicep, tricep, grip, hip flexor, knee extensors, ankle dorsiflexor and plantar flexor Sensory exam normal sensation to light touch and proprioception in bilateral upper and lower extremities  Musculoskeletal: Full range of motion in all 4 extremities. No joint swelling    Assessment/Plan: 1. Functional deficits which require 3+ hours per day of interdisciplinary therapy in a comprehensive inpatient rehab setting. Physiatrist is providing close team supervision and 24 hour management of active medical problems listed below. Physiatrist and rehab team continue to assess barriers to discharge/monitor patient progress toward functional and medical goals  Care Tool:  Bathing    Body parts  bathed by patient: Right arm, Chest, Abdomen, Right upper leg, Left upper leg, Right lower leg, Left lower leg, Face, Front perineal area, Buttocks, Left arm   Body parts bathed by helper: Left arm     Bathing assist Assist Level: Contact Guard/Touching assist     Upper Body Dressing/Undressing Upper body dressing   What is the patient wearing?: Pull over shirt    Upper body assist Assist Level: Supervision/Verbal cueing    Lower Body Dressing/Undressing Lower body dressing      What is the patient wearing?: Underwear/pull up, Pants     Lower body assist Assist for lower body dressing: Contact Guard/Touching assist     Toileting Toileting    Toileting assist Assist for toileting: Moderate Assistance - Patient 50 - 74%     Transfers Chair/bed transfer  Transfers assist     Chair/bed transfer assist level: Minimal Assistance - Patient > 75% (no assistive device)     Locomotion Ambulation   Ambulation assist      Assist level: Minimal Assistance - Patient > 75% Assistive device: No Device Max distance: 15'   Walk 10 feet activity   Assist     Assist level: Moderate Assistance - Patient - 50 - 74% Assistive device: No Device   Walk 50 feet activity   Assist    Assist level: Moderate  Assistance - Patient - 50 - 74% Assistive device: No Device    Walk 150 feet activity   Assist Walk 150 feet activity did not occur: Safety/medical concerns         Walk 10 feet on uneven surface  activity   Assist Walk 10 feet on uneven surfaces activity did not occur: Safety/medical concerns         Wheelchair     Assist Will patient use wheelchair at discharge?: No             Wheelchair 50 feet with 2 turns activity    Assist            Wheelchair 150 feet activity     Assist          Blood pressure 122/73, pulse 63, temperature 97.6 F (36.4 C), resp. rate 20, height _0  (1.778 m), weight 122.6 kg, SpO2 96  %.  Medical Problem List and Plan: 1.  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 Formulized individual treatment plan based on medical history, underlying problem and comorbidities.   Continue CIR- PT, OT and SLP- would do best with OP therapy if this can be started promptly after d/c 2.  Antithrombotics: -DVT/anticoagulation: SCDs             -antiplatelet therapy: Aspirin 81 mg daily and Plavix 75 mg daily x3 months then aspirin alone 3. Pain Management: Flexeril 5 mg 3 times daily as needed, Tylenol as needed  7/17- denies pain- con't regimen prn 4. Mood: Zoloft 100 mg daily             -antipsychotic agents: N/A 5. Neuropsych: This patient is capable of making decisions on her own behalf. 6. Skin/Wound Care: Routine skin checks 7. Fluids/Electrolytes/Nutrition: Routine in and outs with follow-up chemistries 8.  Permissive hypertension.  Presently on HCTZ 12.5 mg daily.  Patient on Zestoretic 20-12.5 mg twice daily prior to admission.  Resume as needed Vitals:   10/07/20 2051 10/08/20 0507  BP: 125/73 122/73  Pulse: 77 63  Resp: 20 20  Temp: 97.8 F (36.6 C) 97.6 F (36.4 C)  SpO2: 99% 96%  Controlled 7/20 9.  Diabetes mellitus.  Hemoglobin A1c 11.7.  NovoLog 8 units 3 times daily, Lantus insulin 70 units daily, Glucotrol 5 mg daily.  Check blood sugars before meals and at bedtime CBG (last 3)  Recent Labs    10/07/20 2106 10/08/20 0606 10/08/20 0621  GLUCAP 196* 174* 170*     Improving increase novolog to 9U  10.  Thrombophlebitis. Resolved  Complete course of Keflex 11.  Hyperlipidemia.  Lipitor  12.  On stroke /sleep apnea research trial   LOS: 7 days A FACE TO FACE EVALUATION WAS  PERFORMED  Charlett Blake 10/08/2020, 8:27 AM

## 2020-10-08 NOTE — Progress Notes (Signed)
Physical Therapy Session Note  Patient Details  Name: Amber [name] MRN: [id number] Date of Birth: [birth year]  Today's Date: 10/08/2020 PT Individual Time: 0900-0930 and 1130-1200 PT Individual Time Calculation (min): 30 min and 30 min  Short Term Goals: Week 1:  PT Short Term Goal 1 (Week 1): Pt will perform supine<>sit supervision PT Short Term Goal 2 (Week 1): Pt will perform sit<>stands using LRAD with CGA PT Short Term Goal 3 (Week 1): Pt will perform bed<>chair transfers using LRAD with CGA PT Short Term Goal 4 (Week 1): Pt will ambulate at least 143ft using LRAD with min assist of 1 PT Short Term Goal 5 (Week 1): Pt will ascend/descend 4 steps using HRs with min assist of 1  Skilled Therapeutic Interventions/Progress Updates:   First session:  Pt presents sitting at EOB and agreeable to therapy.  Pt transfers sit to stand w/ CGA and then amb x 3' to dresser to retrieve clothing.  Pt amb to BR and into shower.  Pt doffs clothing including standing for LB.  Pt completes shower w/ line of sight after given washcloths.  Pt dries self, including standing for back and bottom.  Pt then amb to bed and dons clothing in sitting w/ set-up except for max A for personal compression socks.  Pt stands after donning shoes to pull all pants/underwear.  Pt does personal care including pad for menses, powder and cream/deodorant.  Pt transfers bed > w/c w/ CGA and remains in w/c w/ chair alarm on and all needs in reach.    Second session:  Pt presents sitting in w/c and agreeable to therapy.  Pt wheeled to Dayroom for time conservation.  Pt performed multiple sit to stand transfers w/o UE assist.  Pt also performed w/ [employer] cushion under L foot to effect increased WB to RLE.  Pt requires verbal and manual cues (as fatigues) to maintain WB to RLE.  Pt returned to room and remained in w/c w/ chair alarm on and all needs in reach.     Therapy Documentation Precautions:  Precautions Precautions: Fall, Other  (comment) Precaution Comments: R hemiparesis Restrictions Weight Bearing Restrictions: No General:   Vital Signs:   Pain:0/10 Pain Assessment Pain Scale: Faces Pain Score: 0-No pain      Therapy/Group: Individual Therapy  Lucio Edward 10/08/2020, 11:19 AM

## 2020-10-08 NOTE — Progress Notes (Signed)
Patient ID: Amber [name], female   DOB: [birth year], 50 y.o.   MRN: [id number] Team Conference Report to Patient/Family  Team Conference discussion was reviewed with the patient and caregiver, including goals, any changes in plan of care and target discharge date.  Patient and caregiver express understanding and are in agreement.  The patient has a target discharge date of 10/15/20.  Sw met with patient. Provided conference updates. Patient happy about d/c date and very appreciative. Patient prefers DME be delivered to S.O home. Pt also prefers to be set up at [facility] 10/08/2020, 1:39 PM

## 2020-10-08 NOTE — Progress Notes (Signed)
Pt slept well throughout the night. CPAP intact. Denies pain.

## 2020-10-08 NOTE — Patient Care Conference (Signed)
Inpatient RehabilitationTeam Conference and Plan of Care Update Date: 10/08/2020   Time: 10:41 AM    Patient Name: Amber [name]      Medical Record Number: [id number]  Date of Birth: 1971/02/11 Sex: Female         Room/Bed: 4W17C/4W17C-01 Payor Info: Payor: [employer] / Plan: [facility] PPO / Product Type: *No Product type* /    Admit Date/Time:  10/01/2020  5:35 PM  Primary Diagnosis:  Left pontine cerebrovascular accident [facility])  Hospital Problems: Principal Problem:   Left pontine cerebrovascular accident [facility])    Expected Discharge Date: Expected Discharge Date: 10/15/20  Team Members Present: Physician leading conference: Dr. Claudette Laws Care Coodinator Present: Chana Bode, RN, BSN, CRRN;Christina Vita Barley, BSW Nurse Present: Chana Bode, RN PT Present: Casimiro Needle, PT OT Present: Perrin Maltese, OT SLP Present: Other (comment) Lorelee Market, SLP)     Current Status/Progress Goal Weekly Team Focus  Bowel/Bladder   Continent of bowel and bladder LBM-10/07/20  Remain continent of B/B  Assist with toileting as needed   Swallow/Nutrition/ Hydration             ADL's   Supervision for UB selfcare with min to min guard for LB selfcare.  Min assist for transfers without assistive device secondary to RLE weakness.  RUE currently at active assist level.  Can use to assist with all bathing, dressing, grooming tasks but with decreased strength and decreased FM control  supervision overall  selfcare retraining, neuromuscular re-education, balance retraining, transfer training, DME education, pt/education, therapeutic exercise   Mobility   Decreased coordination with R hemibody; Bed mobility = supervision; Transfers = CGA/ min A; Gait = CGA/ Min A using RW for >120 feet  Bed mobility at Mod I level; Functional Transfers at supervision level; Gait at supervision level; at least 4 Stairs at [facility] level  L hemibody strengthening and NMR, standing tolerance/  balance,  coordination and motor control activities, initiate stair training   Communication             Safety/Cognition/ Behavioral Observations            Pain   Denies pain  Remain pain free.  Assess and address pain every shift.   Skin   Groin, abdominal folds, perineal area, right axilla-pink, pt declined prescribed powder and cream  Promote healing to areas of impairment  Assess and address skin issues every shift.     Discharge Planning:  Discharge home with mother, able to provide supervision. Niece able to assist with some care   Team Discussion: BP stable and working on  CBGs. Right upper extremity function is good, working on strengthening and endurance.  Patient on target to meet rehab goals: yes, currently requires supervision for upper body care and min assist for lower body care. Able to transfer with min assist - CGA and gait up to 120' with CGA. Discharge goals set for supervision level.  *See Care Plan and progress notes for long and short-term goals.   Revisions to Treatment Plan:  SLP eval completed and services discontinued. Noted patient should be able to work from home on a computer as cognitively near baseline.  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  Current Barriers to Discharge: Decreased caregiver support and Home enviroment access/layout  Possible Resolutions to Barriers: Education with significant other Going to fiance's home at discharge  OP follow up services    Medical  Summary Current Status: Diabetes uncontrolled, HTN controlled  Barriers to Discharge: Medical stability   Possible Resolutions to Barriers/Weekly Focus: Diabetic management , high dose insulin   Continued Need for Acute Rehabilitation Level of Care: The patient requires daily medical management by a physician with specialized training in physical medicine and rehabilitation for the  following reasons: Direction of a multidisciplinary physical rehabilitation program to maximize functional independence : Yes Medical management of patient stability for increased activity during participation in an intensive rehabilitation regime.: Yes Analysis of laboratory values and/or radiology reports with any subsequent need for medication adjustment and/or medical intervention. : Yes   I attest that I was present, lead the team conference, and concur with the assessment and plan of the team.   Chana Bode B 10/08/2020, 2:15 PM

## 2020-10-08 NOTE — Progress Notes (Addendum)
Occupational Therapy Weekly Progress Note  Patient Details  Name: Amber [name] MRN: [id number] Date of Birth: [birth year]  Beginning of progress report period: October 01, 2020 End of progress report period: October 08, 2020  Today's Date: 10/08/2020 OT Individual Time: 1001-1031 OT Individual Time Calculation (min): 30 min    Patient has met 4 of 4 short term goals.  Ms. Offner is making steady progress with OT at this time.  She currently completes all UB selfcare at supervision level with LB bathing at min guard assist sit to stand and LB dressing at min assist.  She is completing stand pivot transfers at min assist as well as functional mobility short distances to the bathroom at min assist as well.  RUE function continues to improve and currently she is able to use it functionally at an active assist level for selfcare tasks at supervision overall.  Still with slower coordination and functional use with [place] Peg Test at 1:38 seconds compared to 30 seconds with the left.  Still with RLE weakness as well but progressing overall with decreased ability to efficiently advance the RLE as well as support her weight when stepping with the left.  Feel she is on target with current supervision level goals with ELOS of 7/27.  Feel she will continue to benefit from comprehensive OT at CIR level.    Patient continues to demonstrate the following deficits: muscle weakness and muscle paralysis, impaired timing and sequencing, unbalanced muscle activation, and decreased coordination, and decreased standing balance, decreased postural control, hemiplegia, and decreased balance strategies and therefore will continue to benefit from skilled OT intervention to enhance overall performance with BADL, Vocation, and Reduce care partner burden.  Patient progressing toward long term goals..  Continue plan of care.  OT Short Term Goals Week 2:  OT Short Term Goal 1 (Week 2): Continue working on [employer] at supervision  overall.  Skilled Therapeutic Interventions/Progress Updates:    Session 1: (1001-1031)  Pt in wheelchair to start session with transport down to the ortho gym via wheelchair to begin session.  She then worked on [employer] with BUE use on the [place].  She was able to complete 2 sets of 4 mins each with resistance on level 8 throughout and RPMs maintained above 25.  She was able to maintain grip on the right handle without assistance with the first set peddling forward and then the second peddling backwards.  Finished session with return to the room and with pt remaining up in the wheelchair with the call button and phone in reach.    Session 2: [phone])  Pt worked on rolling herself down to the nurses station in her wheelchair using Anthony.  Increased time needed secondary to RUE weakness.  Once at the nurses station, therapist assisted with pushing her the rest of the way to the tub room.  Educated her on need for a tub bench at home.  She is looking at purchasing one here vs on her own and will let me know her decision tomorrow.  She was able to complete tub/shower transfer with use of the bench and min guard assist.  Next, had her work on RUE functional reach and AROM in the kitchen while standing to take out items from the cabinet and put them back with the RUE.  Also had her stand and work on washing the cabinet doors with emphasis on shoulder flexion with elbow extension and digit extension.  Min guard assist for balance with  occasional LUE assist to hold larger items when taking out of the cabinet and putting them back.  Finished session with return to the room via wheelchair with pt remaining up and with the call button and phone in reach.  Safety belt in place as well.    Therapy Documentation Precautions:  Precautions Precautions: Fall, Other (comment) Precaution Comments: R hemiparesis Restrictions Weight Bearing Restrictions: No   Pain: Pain Assessment Pain Scale:  Faces Pain Score: 0-No pain ADL: See Care Tool Section for some details of mobility and selfcare  Therapy/Group: Individual Therapy  Teagen Bucio OTR/L 10/08/2020, 11:01 AM

## 2020-10-09 LAB — GLUCOSE, CAPILLARY
Glucose-Capillary: 150 mg/dL — ABNORMAL HIGH (ref 70–99)
Glucose-Capillary: 99 mg/dL (ref 70–99)
Glucose-Capillary: 200 mg/dL — ABNORMAL HIGH (ref 70–99)
Glucose-Capillary: 144 mg/dL — ABNORMAL HIGH (ref 70–99)

## 2020-10-09 MED ORDER — INSULIN ASPART 100 UNIT/ML IJ SOLN
10.0000 [IU] | Freq: Three times a day (TID) | INTRAMUSCULAR | Status: DC
  Administered 2020-10-09 – 2020-10-15 (×19): 10 [IU] via SUBCUTANEOUS

## 2020-10-09 NOTE — Progress Notes (Signed)
Occupational Therapy Session Note  Patient Details  Name: Amber [name] MRN: [id number] Date of Birth: Jan 29, 1971  Today's Date: 10/09/2020 OT Individual Time: 1530-1600 OT Individual Time Calculation (min): 30 min    Short Term Goals: Week 2:  OT Short Term Goal 1 (Week 2): Continue working on [employer] at supervision overall.  Skilled Therapeutic Interventions/Progress Updates:     Pt received in w/c with soreness in R trap from compensatory movements. Pt able ot demo shepherd hook for pain relief  Therapeutic activity Pt wanting to work on Administrator, Civil Service. Provided pt with various grips to trial and coban worked best> pt also benefitted from spacing strategy to improve legibility. Highlighted paper for larger letter writing practice for alphabet and signiture.   Pt left at end of session in w/c with exit alarm on, call light in reach and all needs met   Therapy Documentation Precautions:  Precautions Precautions: Fall, Other (comment) Precaution Comments: R hemiparesis Restrictions Weight Bearing Restrictions: No General:   Vital Signs: Therapy Vitals Temp: 98 F (36.7 C) Temp Source: Oral Pulse Rate: 60 Resp: 18 BP: 138/70 Patient Position (if appropriate): Lying Oxygen Therapy SpO2: 100 % O2 Device: CPAP Pain:   ADL: ADL Eating: Supervision/safety Where Assessed-Eating: Edge of bed Grooming: Minimal assistance Where Assessed-Grooming: Chair Upper Body Bathing: Minimal assistance Where Assessed-Upper Body Bathing: Shower, Chair Lower Body Bathing: Minimal assistance Where Assessed-Lower Body Bathing: Shower Upper Body Dressing: Moderate assistance Where Assessed-Upper Body Dressing: Chair Lower Body Dressing: Moderate assistance Where Assessed-Lower Body Dressing: Chair, Sitting at sink, Standing at sink Toileting: Minimal assistance Where Assessed-Toileting: Bedside Commode Toilet Transfer: Moderate assistance Toilet Transfer Method: Air traffic controller: Radiographer, therapeutic: Not assessed Social research officer, government: Moderate assistance Social research officer, government Method: Heritage manager: Radio broadcast assistant, Systems analyst    Praxis   Exercises:   Other Treatments:     Therapy/Group: Individual Therapy  [name] 10/09/2020, 6:50 AM

## 2020-10-09 NOTE — Progress Notes (Signed)
Occupational Therapy Session Note  Patient Details  Name: Amber [name] MRN: [id number] Date of Birth: May 26, 1970  Today's Date: 10/09/2020 OT Individual Time: 5427-0623 OT Individual Time Calculation (min): 55 min    Short Term Goals: Week 2:  OT Short Term Goal 1 (Week 2): Continue working on [employer] at supervision overall.  Skilled Therapeutic Interventions/Progress Updates:    Session 1: (7628-3151)  Pt completed shower and dressing during session.  She was able to ambulate around the room to gather clothing and items for dressing with min guard assist and not device.  Noted decreased right knee and hip control still present with pt needing cueing to go slowly to avoid LOB.  She completed all bathing sit to stand with min guard assist.  RUE was used throughout bathing with supervision for washing the LUE and other parts of the body.  She was able to transfer out to the EOB with min guard for donning deodorant and for dressing tasks.  Supervision for UB dressing with min assist for LB dressing to pull up underpants and pants over her hips on the right side, using the RUE.  She complete oral hygiene and brushing her hair at the sink with min guard while standing.  Finished session with gathering of dirty clothing and placing them in her drawer in her closet.  She then transferred to the wheelchair where she remained to rest.  Nursing made aware of skin blister on her left toe which has been present since before the stroke.  She reports not pain in it at this time.   Session 2: (1303-1400)  Pt in wheelchair to start with focus on wheelchair mobility to roll herself down to the nurses station.  She needed increased time while isolating use of BUEs secondary to increased weakness and decreased coordination in the RUE.  She was able to complete however with supervision.  Helped transport her to the dayroom from the nurses station where she focused on RUE strengthening and coordination with use of the  Wii tennis game.  She was able to complete several standing intervals for 4-5 mins while engaged in RUE use.  Decreased consistency with coordination was noted when playing the game with pt demonstrating decreased timing of swinging her arm from horizontally abducted and externally rotated position to adducted position.  She was able to hold onto the game controller with her hand at independent level with decreased FM coordination noted when having to press the buttons.  Min guard assist needed for static standing balance with min assist for dynamic standing balance when taking steps and turning.  Returned to the room at the end of the session with the call button and phone in reach and safety alarm belt in place.    Therapy Documentation Precautions:  Precautions Precautions: Fall, Other (comment) Precaution Comments: R hemiparesis Restrictions Weight Bearing Restrictions: No   Pain: Pain Assessment Pain Scale: Faces Pain Score: 0-No pain ADL: See Care Tool Section for some details of mobility and selfcare  Therapy/Group: Individual Therapy  Izella Ybanez OTR/L 10/09/2020, 12:50 PM

## 2020-10-09 NOTE — Progress Notes (Signed)
Patient ID: Amber [name], female   DOB: [birth year], 50 y.o.   MRN: [id number]  Bedside Commode ordered through [employer]  [place], [place] [phone]

## 2020-10-09 NOTE — Progress Notes (Signed)
Physical Therapy Weekly Progress Note  Patient Details  Name: Amber [name] MRN: [id number] Date of Birth: [birth year]  Beginning of progress report period: October 02, 2020 End of progress report period: October 09, 2020  Today's Date: 10/09/2020 PT Individual Time: 0805-0900 PT Individual Time Calculation (min): 55 min   Patient has met 5 of 5 short term goals.  Pt is a hard worker who has made good progress since [facility]. Demos improvements in balance and functional mobility d/t increased proprioception, strength, motor control, safety awareness.  Patient continues to demonstrate the following deficits muscle weakness, decreased cardiorespiratoy endurance, impaired timing and sequencing, abnormal tone, unbalanced muscle activation, motor apraxia, decreased coordination, and decreased motor planning, ,, decreased safety awareness, and decreased standing balance, decreased balance strategies, and hemipareisis  and therefore will continue to benefit from skilled PT intervention to increase functional independence with mobility.  Patient progressing toward long term goals..  Continue plan of care.  PT Short Term Goals Week 1:  PT Short Term Goal 1 (Week 1): Pt will perform supine<>sit supervision PT Short Term Goal 1 - Progress (Week 1): Met PT Short Term Goal 2 (Week 1): Pt will perform sit<>stands using LRAD with CGA PT Short Term Goal 2 - Progress (Week 1): Met PT Short Term Goal 3 (Week 1): Pt will perform bed<>chair transfers using LRAD with CGA PT Short Term Goal 3 - Progress (Week 1): Met PT Short Term Goal 4 (Week 1): Pt will ambulate at least 181f using LRAD with min assist of 1 PT Short Term Goal 4 - Progress (Week 1): Met PT Short Term Goal 5 (Week 1): Pt will ascend/descend 4 steps using HRs with min assist of 1 PT Short Term Goal 5 - Progress (Week 1): Met Week 2:  PT Short Term Goal 1 (Week 2): STG = LTG d/t ELOS  Skilled Therapeutic Interventions/Progress Updates:  Patient seated  on EOB on entrance to room. Patient alert and agreeable to PT session, but relates mild fatigue possibly from hard efforts during therapy the previous day. Patient denied pain during session.  Therapeutic Activity: Transfers: Patient performed STS and SPVT transfers throughout session to/ from various seat heights and surfaces with supervision/ CGA. Pt very focused on performance and recites learned cues from therapists. Provided verbal cues for encouragement, taking time, and safety awareness in outdoor setting.  Gait Training:  Pt taken outdoors for gait training over uneven paved surfaces. Patient ambulated ~100 feet x3 as well as over thresholds from outdoor to indoor, into elevator and back to room. Ambuation performed using RW for stability over uneven surfaces and for education on unweighting RW to maintain safety and balance. Demonstrated intermittent uncontrolled hyperextension into TKE but with decreased frequency with improved effort and focus. Provided vc/ tc for widening RLE step placement, maintaining level gaze, unweighting with cracks in pavement and change in surface over floormat.   Neuromuscular Re-ed: NMR facilitated during session with focus on controlling TKE in WB during stance phase. One bout of amb with focus on limiting uncontrolled R knee hyperextension. TC provided to cue increased eccentric/ concentric quad activation for improved control.  NMR performed for improvements in motor control and coordination, balance, sequencing, judgement, and self confidence/ efficacy in performing all aspects of mobility at highest level of independence.   Patient seated upright  in w/c at end of session with brakes locked, belt alarm set, and all needs within reach.     Therapy Documentation Precautions:  Precautions Precautions: Fall, Other (comment) Precaution  Comments: R hemiparesis Restrictions Weight Bearing Restrictions: No  Therapy/Group: Individual Therapy  Alger Simons  PT, DPT 10/09/2020, 6:27 PM

## 2020-10-09 NOTE — Progress Notes (Signed)
PROGRESS NOTE   Subjective/Complaints:  No issues overnite  ROS- Pt denies SOB, abd pain, CP, N/V/C/D, and vision changes  Objective:   No results found. No results for input(s): WBC, HGB, HCT, PLT in the last 72 hours. No results for input(s): NA, K, CL, CO2, GLUCOSE, BUN, CREATININE, CALCIUM in the last 72 hours.  Intake/Output Summary (Last 24 hours) at 10/09/2020 0753 Last data filed at 10/08/2020 2118 Gross per 24 hour  Intake 547 ml  Output --  Net 547 ml         Physical Exam: Vital Signs Blood pressure 138/70, pulse 60, temperature 98 F (36.7 C), temperature source Oral, resp. rate 18, height 5\' 10"  (1.778 m), weight 122.8 kg, SpO2 100 %.  General: No acute distress Mood and affect are appropriate Heart: Regular rate and rhythm no rubs murmurs or extra sounds Lungs: Clear to auscultation, breathing unlabored, no rales or wheezes Abdomen: Positive bowel sounds, soft nontender to palpation, nondistended  Extremities: No clubbing, cyanosis, or edema Skin: No evidence of breakdown, no evidence of rash Neurologic: Cranial nerves II through XII intact, motor strength is 5/5 in left 4/5 RIght deltoid, bicep, tricep, grip, hip flexor, knee extensors, ankle dorsiflexor and plantar flexor Sensory exam normal sensation to light touch and proprioception in bilateral upper and lower extremities  Musculoskeletal: Full range of motion in all 4 extremities. No joint swelling    Assessment/Plan: 1. Functional deficits which require 3+ hours per day of interdisciplinary therapy in a comprehensive inpatient rehab setting. Physiatrist is providing close team supervision and 24 hour management of active medical problems listed below. Physiatrist and rehab team continue to assess barriers to discharge/monitor patient progress toward functional and medical goals  Care Tool:  Bathing    Body parts bathed by patient: Right  arm, Chest, Abdomen, Right upper leg, Left upper leg, Right lower leg, Left lower leg, Face, Front perineal area, Buttocks, Left arm   Body parts bathed by helper: Left arm     Bathing assist Assist Level: Contact Guard/Touching assist     Upper Body Dressing/Undressing Upper body dressing   What is the patient wearing?: Pull over shirt    Upper body assist Assist Level: Supervision/Verbal cueing    Lower Body Dressing/Undressing Lower body dressing      What is the patient wearing?: Underwear/pull up, Pants     Lower body assist Assist for lower body dressing: Contact Guard/Touching assist     Toileting Toileting    Toileting assist Assist for toileting: Moderate Assistance - Patient 50 - 74%     Transfers Chair/bed transfer  Transfers assist     Chair/bed transfer assist level: Contact Guard/Touching assist     Locomotion Ambulation   Ambulation assist      Assist level: Minimal Assistance - Patient > 75% Assistive device: No Device Max distance: 15'   Walk 10 feet activity   Assist     Assist level: Moderate Assistance - Patient - 50 - 74% Assistive device: No Device   Walk 50 feet activity   Assist    Assist level: Moderate Assistance - Patient - 50 - 74% Assistive device: No Device  Walk 150 feet activity   Assist Walk 150 feet activity did not occur: Safety/medical concerns         Walk 10 feet on uneven surface  activity   Assist Walk 10 feet on uneven surfaces activity did not occur: Safety/medical concerns         Wheelchair     Assist Will patient use wheelchair at discharge?: No             Wheelchair 50 feet with 2 turns activity    Assist            Wheelchair 150 feet activity     Assist          Blood pressure 138/70, pulse 60, temperature 98 F (36.7 C), temperature source Oral, resp. rate 18, height 5\' 10"  (1.778 m), weight 122.8 kg, SpO2 100 %.  Medical Problem List and  Plan: 1.  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  Antithrombotics: -DVT/anticoagulation: SCDs             -antiplatelet therapy: Aspirin 81 mg daily and Plavix 75 mg daily x3 months then aspirin alone 3. Pain Management: Flexeril 5 mg 3 times daily as needed, Tylenol as needed  7/17- denies pain- con't regimen prn 4. Mood: Zoloft 100 mg daily             -antipsychotic agents: N/A 5. Neuropsych: This patient is capable of making decisions on her own behalf. 6. Skin/Wound Care: Routine skin checks 7. Fluids/Electrolytes/Nutrition: Routine in and outs with follow-up chemistries 8.  Permissive hypertension.  Presently on HCTZ 12.5 mg daily.  Patient on Zestoretic 20-12.5 mg twice daily prior to admission.  Resume as needed Vitals:   10/08/20 1936 10/09/20 0500  BP: 138/74 138/70  Pulse: 78 60  Resp: 18 18  Temp: 98 F (36.7 C) 98 F (36.7 C)  SpO2: 100% 100%  Controlled 7/21 9.  Diabetes mellitus.  Hemoglobin A1c 11.7.  NovoLog 8 units 3 times daily, Lantus insulin 70 units daily, Glucotrol 5 mg daily.  Check blood sugars before meals and at bedtime CBG (last 3)  Recent Labs    10/08/20 1626 10/08/20 2100 10/09/20 0619  GLUCAP 209* 186* 150*     Improving increase novolog to 10U  10.  Thrombophlebitis. Resolved  Complete course of Keflex 11.  Hyperlipidemia.  Lipitor  12.  On stroke /sleep apnea research trial   LOS: 8 days A FACE TO FACE EVALUATION WAS PERFORMED  10/11/20 10/09/2020, 7:53 AM

## 2020-10-10 LAB — GLUCOSE, CAPILLARY
Glucose-Capillary: 131 mg/dL — ABNORMAL HIGH (ref 70–99)
Glucose-Capillary: 132 mg/dL — ABNORMAL HIGH (ref 70–99)
Glucose-Capillary: 116 mg/dL — ABNORMAL HIGH (ref 70–99)
Glucose-Capillary: 255 mg/dL — ABNORMAL HIGH (ref 70–99)

## 2020-10-10 NOTE — Progress Notes (Signed)
Occupational Therapy Session Note  Patient Details  Name: Amber [name] MRN: [id number] Date of Birth: Oct 13, 1970  Today's Date: 10/10/2020 OT Individual Time:  -       Short Term Goals: Week 1:  OT Short Term Goal 1 (Week 1): Pt will complete LB dressing with min assist sit to stand. OT Short Term Goal 1 - Progress (Week 1): Met OT Short Term Goal 2 (Week 1): Pt will complete all UB bathing with supervision in sitting. OT Short Term Goal 2 - Progress (Week 1): Met OT Short Term Goal 3 (Week 1): Pt will complete toilet transfers with min guard assist using the RW for support to the [facility] OT Short Term Goal 3 - Progress (Week 1): Met OT Short Term Goal 4 (Week 1): Pt will use the RUE at an active assist level with supervision. OT Short Term Goal 4 - Progress (Week 1): Met  Skilled Therapeutic Interventions/Progress Updates:    Pt participated in rhythmic drumming group. Pain not reported during session. Focus of group on BUE coordination, strengthening, endurance, timing/control, activity tolerance, and social participation and engagement. Pt performs session from seated position for energy conservation. Skilled interventions included grading timing/control and rhythm in slower paces to improve coordination. Warm up performed prior to exercises and UB stretching completed at end of group with demo from OT. Pt able to select preferred song to share with group. Returned pt to room at end of session. Exited session with pt seated in w/c, exit alarm on and call light in reach   Therapy Documentation Precautions:  Precautions Precautions: Fall, Other (comment) Precaution Comments: R hemiparesis Restrictions Weight Bearing Restrictions: No General:   Vital Signs: Therapy Vitals Temp: (!) 97.5 F (36.4 C) Pulse Rate: 64 Resp: 20 BP: 124/78 Patient Position (if appropriate): Lying Oxygen Therapy SpO2: 97 % O2 Device: Room Air Pain:   ADL: ADL Eating: Supervision/safety Where  Assessed-Eating: Edge of bed Grooming: Minimal assistance Where Assessed-Grooming: Chair Upper Body Bathing: Minimal assistance Where Assessed-Upper Body Bathing: Multimedia programmer, Chair Lower Body Bathing: Minimal assistance Where Assessed-Lower Body Bathing: Shower Upper Body Dressing: Moderate assistance Where Assessed-Upper Body Dressing: Chair Lower Body Dressing: Moderate assistance Where Assessed-Lower Body Dressing: Chair, Sitting at sink, Standing at sink Toileting: Minimal assistance Where Assessed-Toileting: Bedside Commode Toilet Transfer: Moderate assistance Toilet Transfer Method: Counselling psychologist: Radiographer, therapeutic: Not assessed Social research officer, government: Moderate assistance Social research officer, government Method: Heritage manager: Radio broadcast assistant, Systems analyst    Praxis   Exercises:   Other Treatments:     Therapy/Group: Group Therapy  [name] 10/10/2020, 6:43 AM
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
## 2020-10-10 NOTE — Progress Notes (Signed)
Occupational Therapy Session Note  Patient Details  Name: Amber [name] MRN: [id number] Date of Birth: 05/07/1970  Today's Date: 10/10/2020 OT Individual Time: 1300-1400 OT Individual Time Calculation (min): 60 min    Short Term Goals: Week 2:  OT Short Term Goal 1 (Week 2): Continue working on [employer] at supervision overall.  Skilled Therapeutic Interventions/Progress Updates: Patient up in wheelchair, eager for therapy.  Transported to gym to address RUE functioning.  Neuromuscular reeducation to address weight shift to right side in seated and standing conditions.  Patient drifts to left - with little awareness.  Used mirror to help orient to midline, needed frequent cues to check posture in mirror.   Worked on sit to stand with feet shoulder width apart, working on improved knee/hip control and coordination.  Modified plantigrade to address loading of BUE, increasing activity of scapular muscles.   Addressed fine motor control - picking up pen from surface, rotating pen and clicking pen, printing and writing name.   Patient returned to room with wheelchair belt alarm, call bell and personal items in reach;     Therapy Documentation Precautions:  Precautions Precautions: Fall, Other (comment) Precaution Comments: R hemiparesis Restrictions Weight Bearing Restrictions: No General: General PT Missed Treatment Reason: Other (Comment) (family issue w/ mother in ED.) Vital Signs:  Pain: No pain     Other Treatments:     Therapy/Group: Individual Therapy  Collier Salina, OTR/L 10/10/2020, 2:09 PM

## 2020-10-10 NOTE — Progress Notes (Signed)
Physical Therapy Session Note  Patient Details  Name: Amber [name] MRN: [id number] Date of Birth: 04-Dec-1970  Today's Date: 10/10/2020 PT Individual Time: 1019-1100 PT Individual Time Calculation (min): 41 min  Missed time: 2' Short Term Goals: Week 1:  PT Short Term Goal 1 (Week 1): Pt will perform supine<>sit supervision PT Short Term Goal 1 - Progress (Week 1): Met PT Short Term Goal 2 (Week 1): Pt will perform sit<>stands using LRAD with CGA PT Short Term Goal 2 - Progress (Week 1): Met PT Short Term Goal 3 (Week 1): Pt will perform bed<>chair transfers using LRAD with CGA PT Short Term Goal 3 - Progress (Week 1): Met PT Short Term Goal 4 (Week 1): Pt will ambulate at least 123f using LRAD with min assist of 1 PT Short Term Goal 4 - Progress (Week 1): Met PT Short Term Goal 5 (Week 1): Pt will ascend/descend 4 steps using HRs with min assist of 1 PT Short Term Goal 5 - Progress (Week 1): Met  Skilled Therapeutic Interventions/Progress Updates: PT entered room and Pt sitting in w/c but having issues w/ mother in ED.  Pt needs to make phone call to get care for mother at home.  PT returned and pt agreeable to therapy.  Pt wheeled to [place] for time conservation.  Pt performed sit to stand transfer w/ supervision.  Pt amb 40' to mat table w/o AD and CGA.  Pt amb w/ 1 knee hyperextension during R LE WB w/o LOB.  Pt performed sit to stand transfers w/o UE assist and slow eccentric control for stand to sit.  Pt encouraged to increase WB to RLE, but noted decrease w/ fatigue.  Pt performed TKE w/ 1-2" balance board under R foot although initially only able to come to heel-off on LLE.  Pt performed lateral lunge to R w/ cueing for R foot placement.  Pt amb through obstacles including stepping over cord w/ CGA and no AD.  1 Knee hyperextension episode during gait but no LOB.  Pt handed off to OT/tech for group in Dayroom.     Therapy Documentation Precautions:  Precautions Precautions: Fall,  Other (comment) Precaution Comments: R hemiparesis Restrictions Weight Bearing Restrictions: No General: PT Missed Treatment Reason: Other (Comment) (family issue w/ mother in ED.) Vital Signs:  Pain:0/10 Pain Assessment Pain Scale: 0-10 Pain Score: 0-No pain Mobility:       Therapy/Group: Individual Therapy  JLadoris Gene7/22/2022, 12:22 PM

## 2020-10-11 LAB — GLUCOSE, CAPILLARY
Glucose-Capillary: 230 mg/dL — ABNORMAL HIGH (ref 70–99)
Glucose-Capillary: 107 mg/dL — ABNORMAL HIGH (ref 70–99)
Glucose-Capillary: 217 mg/dL — ABNORMAL HIGH (ref 70–99)
Glucose-Capillary: 197 mg/dL — ABNORMAL HIGH (ref 70–99)

## 2020-10-11 NOTE — Progress Notes (Signed)
Physical Therapy Session Note  Patient Details  Name: Amber [name] MRN: [id number] Date of Birth: 05-20-1970  Today's Date: 10/11/2020 PT Individual Time: 1000-1100 PT Individual Time Calculation (min): 60 min   Short Term Goals: Week 2:  PT Short Term Goal 1 (Week 2): STG = LTG d/t ELOS  Skilled Therapeutic Interventions/Progress Updates: Pt presents sitting in w/c and eager for therapy.  Pt wheeled to main gym for time conservation.  Pt performed stepping over dividers on floor w/ reciprocal gait, but noted LOB so remainder performed in // bars.  Pt amb multiple short trials up to 10' on and off // bars w/o AD and cga to min today.  Pt performed sidestepping w/ and w/o UE support.  Pt experienced R knee hyperextension 20% of WB today.  Pt w/ verbal cues for deliberate placement of BLEs when sidestepping to maintain BOS.  Pt performed toe taps standing at 6" step w/ use of handrails.  Pt encouraged to flex hip/knee to tap.  Pt returned to room and remained in w/c w/ chair alarm on and all needs in reach.  Pt states will visit mother here in hspital andstates she was allowed to amb w/ RW, but educated on need to only perform when in therapy.  Pt agreeable and understands.     Therapy Documentation Precautions:  Precautions Precautions: Fall, Other (comment) Precaution Comments: R hemiparesis Restrictions Weight Bearing Restrictions: No General:   Vital Signs:   Pain:0/10 Pain Assessment Pain Scale: 0-10 Pain Score: 0-No pain Mobility:       Therapy/Group: Individual Therapy  Lucio Edward 10/11/2020, 11:00 AM

## 2020-10-12 LAB — GLUCOSE, CAPILLARY
Glucose-Capillary: 145 mg/dL — ABNORMAL HIGH (ref 70–99)
Glucose-Capillary: 161 mg/dL — ABNORMAL HIGH (ref 70–99)
Glucose-Capillary: 141 mg/dL — ABNORMAL HIGH (ref 70–99)
Glucose-Capillary: 154 mg/dL — ABNORMAL HIGH (ref 70–99)
Glucose-Capillary: 142 mg/dL — ABNORMAL HIGH (ref 70–99)

## 2020-10-12 NOTE — Progress Notes (Signed)
PROGRESS NOTE   Subjective/Complaints:  Doing very well, can snap fingers on RIght hand   ROS- Pt denies SOB, abd pain, CP, N/V/C/D, and vision changes  Objective:   No results found. No results for input(s): WBC, HGB, HCT, PLT in the last 72 hours. No results for input(s): NA, K, CL, CO2, GLUCOSE, BUN, CREATININE, CALCIUM in the last 72 hours.  Intake/Output Summary (Last 24 hours) at 10/12/2020 0728 Last data filed at 10/11/2020 1324 Gross per 24 hour  Intake 340 ml  Output --  Net 340 ml         Physical Exam: Vital Signs Blood pressure (!) 143/76, pulse 87, temperature 97.8 F (36.6 C), temperature source Axillary, resp. rate 17, height 5\' 10"  (1.778 m), weight 119.8 kg, SpO2 99 %.  General: No acute distress Mood and affect are appropriate Heart: Regular rate and rhythm no rubs murmurs or extra sounds Lungs: Clear to auscultation, breathing unlabored, no rales or wheezes Abdomen: Positive bowel sounds, soft nontender to palpation, nondistended Extremities: No clubbing, cyanosis, or edema Skin: No evidence of breakdown, no evidence of rash   Skin: No evidence of breakdown, no evidence of rash Neurologic: Cranial nerves II through XII intact, motor strength is 5/5 in left 4/5 RIght deltoid, bicep, tricep, grip, hip flexor, knee extensors, ankle dorsiflexor and plantar flexor Sensory exam normal sensation to light touch and proprioception in bilateral upper and lower extremities  Musculoskeletal: Full range of motion in all 4 extremities. No joint swelling    Assessment/Plan: 1. Functional deficits which require 3+ hours per day of interdisciplinary therapy in a comprehensive inpatient rehab setting. Physiatrist is providing close team supervision and 24 hour management of active medical problems listed below. Physiatrist and rehab team continue to assess barriers to discharge/monitor patient progress toward  functional and medical goals  Care Tool:  Bathing    Body parts bathed by patient: Right arm, Chest, Abdomen, Right upper leg, Left upper leg, Right lower leg, Left lower leg, Face, Front perineal area, Buttocks, Left arm   Body parts bathed by helper: Left arm     Bathing assist Assist Level: Contact Guard/Touching assist     Upper Body Dressing/Undressing Upper body dressing   What is the patient wearing?: Pull over shirt    Upper body assist Assist Level: Supervision/Verbal cueing    Lower Body Dressing/Undressing Lower body dressing      What is the patient wearing?: Underwear/pull up, Pants     Lower body assist Assist for lower body dressing: Minimal Assistance - Patient > 75%     Toileting Toileting    Toileting assist Assist for toileting: Moderate Assistance - Patient 50 - 74%     Transfers Chair/bed transfer  Transfers assist     Chair/bed transfer assist level: Contact Guard/Touching assist     Locomotion Ambulation   Ambulation assist      Assist level: Contact Guard/Touching assist Assistive device: No Device Max distance: 10   Walk 10 feet activity   Assist     Assist level: Contact Guard/Touching assist Assistive device: No Device   Walk 50 feet activity   Assist    Assist level: Moderate  Assistance - Patient - 50 - 74% Assistive device: No Device    Walk 150 feet activity   Assist Walk 150 feet activity did not occur: Safety/medical concerns         Walk 10 feet on uneven surface  activity   Assist Walk 10 feet on uneven surfaces activity did not occur: Safety/medical concerns         Wheelchair     Assist Will patient use wheelchair at discharge?: No             Wheelchair 50 feet with 2 turns activity    Assist            Wheelchair 150 feet activity     Assist          Blood pressure (!) 143/76, pulse 87, temperature 97.8 F (36.6 C), temperature source Axillary, resp.  rate 17, height 5\' 10"  (1.778 m), weight 119.8 kg, SpO2 99 %.  Medical Problem List and Plan: 1.  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  Antithrombotics: -DVT/anticoagulation: SCDs             -antiplatelet therapy: Aspirin 81 mg daily and Plavix 75 mg daily x3 months then aspirin alone 3. Pain Management: Flexeril 5 mg 3 times daily as needed, Tylenol as needed  7/17- denies pain- con't regimen prn 4. Mood: Zoloft 100 mg daily             -antipsychotic agents: N/A 5. Neuropsych: This patient is capable of making decisions on her own behalf. 6. Skin/Wound Care: Routine skin checks 7. Fluids/Electrolytes/Nutrition: Routine in and outs with follow-up chemistries 8.  Permissive hypertension.  Presently on HCTZ 12.5 mg daily.  Patient on Zestoretic 20-12.5 mg twice daily prior to admission.  Resume as needed Vitals:   10/11/20 1928 10/12/20 0439  BP: (!) 147/87 (!) 143/76  Pulse: 89 87  Resp: 19 17  Temp: 98.1 F (36.7 C) 97.8 F (36.6 C)  SpO2: 99% 99%  Controlled 7/24 9.  Diabetes mellitus.  Hemoglobin A1c 11.7.  NovoLog 8 units 3 times daily, Lantus insulin 70 units daily, Glucotrol 5 mg daily.  Check blood sugars before meals and at bedtime CBG (last 3)  Recent Labs    10/11/20 1746 10/11/20 2132 10/12/20 0600  GLUCAP 217* 197* 145*     Improving increased novolog to 10U on 7/21, increase lantus to 75U 10.  Thrombophlebitis. Resolved  Completed Keflex 11.  Hyperlipidemia.  Lipitor  12.  On stroke /sleep apnea research trial   LOS: 11 days A FACE TO FACE EVALUATION WAS PERFORMED  8/21 10/12/2020, 7:28 AM

## 2020-10-13 LAB — GLUCOSE, CAPILLARY
Glucose-Capillary: 198 mg/dL — ABNORMAL HIGH (ref 70–99)
Glucose-Capillary: 121 mg/dL — ABNORMAL HIGH (ref 70–99)
Glucose-Capillary: 124 mg/dL — ABNORMAL HIGH (ref 70–99)
Glucose-Capillary: 163 mg/dL — ABNORMAL HIGH (ref 70–99)

## 2020-10-13 MED ORDER — INSULIN GLARGINE 100 UNIT/ML ~~LOC~~ SOLN
71.0000 [IU] | Freq: Every day | SUBCUTANEOUS | Status: DC
  Administered 2020-10-14 – 2020-10-15 (×2): 71 [IU] via SUBCUTANEOUS
  Filled 2020-10-13 (×3): qty 0.71

## 2020-10-13 NOTE — Progress Notes (Signed)
Patient ID: Amber [name], female   DOB: 1970-03-26, 50 y.o.   MRN: [id number]  Rolling Walker ordered through [employer].  [place], [place] [phone]

## 2020-10-13 NOTE — Progress Notes (Signed)
PROGRESS NOTE   Subjective/Complaints: No complaints Very satisfied with her progress  ROS- Pt denies SOB, abd pain, CP, N/V/C/D, and vision changes  Objective:   No results found. No results for input(s): WBC, HGB, HCT, PLT in the last 72 hours. No results for input(s): NA, K, CL, CO2, GLUCOSE, BUN, CREATININE, CALCIUM in the last 72 hours.  Intake/Output Summary (Last 24 hours) at 10/13/2020 1416 Last data filed at 10/13/2020 1407 Gross per 24 hour  Intake 476 ml  Output 1 ml  Net 475 ml        Physical Exam: Vital Signs Blood pressure 139/87, pulse 97, temperature 98.1 F (36.7 C), temperature source Oral, resp. rate 19, height 5\' 10"  (1.778 m), weight 119.8 kg, SpO2 95 %.  Gen: no distress, normal appearing HEENT: oral mucosa pink and moist, NCAT Cardio: Reg rate Chest: normal effort, normal rate of breathing Abd: soft, non-distended Ext: no edema Psych: pleasant, normal affect   Skin: No evidence of breakdown, no evidence of rash Neurologic: Cranial nerves II through XII intact, motor strength is 5/5 in left 4/5 RIght deltoid, bicep, tricep, grip, hip flexor, knee extensors, ankle dorsiflexor and plantar flexor Sensory exam normal sensation to light touch and proprioception in bilateral upper and lower extremities  Musculoskeletal: Full range of motion in all 4 extremities. No joint swelling    Assessment/Plan: 1. Functional deficits which require 3+ hours per day of interdisciplinary therapy in a comprehensive inpatient rehab setting. Physiatrist is providing close team supervision and 24 hour management of active medical problems listed below. Physiatrist and rehab team continue to assess barriers to discharge/monitor patient progress toward functional and medical goals  Care Tool:  Bathing    Body parts bathed by patient: Right arm, Chest, Abdomen, Right upper leg, Left upper leg, Right lower leg,  Left lower leg, Face, Front perineal area, Buttocks, Left arm   Body parts bathed by helper: Left arm     Bathing assist Assist Level: Contact Guard/Touching assist     Upper Body Dressing/Undressing Upper body dressing   What is the patient wearing?: Pull over shirt    Upper body assist Assist Level: Supervision/Verbal cueing    Lower Body Dressing/Undressing Lower body dressing      What is the patient wearing?: Underwear/pull up, Pants     Lower body assist Assist for lower body dressing: Minimal Assistance - Patient > 75%     Toileting Toileting    Toileting assist Assist for toileting: Moderate Assistance - Patient 50 - 74%     Transfers Chair/bed transfer  Transfers assist     Chair/bed transfer assist level: Contact Guard/Touching assist     Locomotion Ambulation   Ambulation assist      Assist level: Contact Guard/Touching assist Assistive device: No Device Max distance: 10   Walk 10 feet activity   Assist     Assist level: Contact Guard/Touching assist Assistive device: No Device   Walk 50 feet activity   Assist    Assist level: Moderate Assistance - Patient - 50 - 74% Assistive device: No Device    Walk 150 feet activity   Assist Walk 150 feet activity did not  occur: Safety/medical concerns         Walk 10 feet on uneven surface  activity   Assist Walk 10 feet on uneven surfaces activity did not occur: Safety/medical concerns         Wheelchair     Assist Will patient use wheelchair at discharge?: No             Wheelchair 50 feet with 2 turns activity    Assist            Wheelchair 150 feet activity     Assist          Blood pressure 139/87, pulse 97, temperature 98.1 F (36.7 C), temperature source Oral, resp. rate 19, height 5\' 10"  (1.778 m), weight 119.8 kg, SpO2 95 %.  Medical Problem List and Plan: 1.  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  Antithrombotics: -DVT/anticoagulation: SCDs             -antiplatelet therapy: Aspirin 81 mg daily and Plavix 75 mg daily x3 months then aspirin alone 3. Pain Management: Flexeril 5 mg 3 times daily as needed, Tylenol as needed  7/17- denies pain- con't regimen prn 4. Depression: Continue Zoloft 100 mg daily             -antipsychotic agents: N/A 5. Neuropsych: This patient is capable of making decisions on her own behalf. 6. Skin/Wound Care: Routine skin checks 7. Fluids/Electrolytes/Nutrition: Routine in and outs with follow-up chemistries 8.  Permissive hypertension.  Presently on HCTZ 12.5 mg daily.  Patient on Zestoretic 20-12.5 mg twice daily prior to admission.  Resume as needed Vitals:   10/13/20 0434 10/13/20 1405  BP: 137/74 139/87  Pulse: 68 97  Resp: 16 19  Temp: 97.6 F (36.4 C) 98.1 F (36.7 C)  SpO2: 96% 95%  Controlled 7/25 9.  Diabetes mellitus.  Hemoglobin A1c 11.7.  NovoLog 8 units 3 times daily, Lantus insulin 70 units daily, Glucotrol 5 mg daily.  Check blood sugars before meals and at bedtime CBG (last 3)  Recent Labs    10/12/20 1706 10/13/20 0555 10/13/20 1206  GLUCAP 142* 198* 121*    Improving increased novolog to 10U on 7/21, increase lantus to 75U  7/25: increase Lantus to 71U 10.  Thrombophlebitis. Resolved  Completed Keflex 11.  Hyperlipidemia.  Continue Lipitor  12.  On stroke /sleep apnea research trial   LOS: 12 days A FACE TO FACE EVALUATION WAS PERFORMED  8/25 P Keilly Fatula 10/13/2020, 2:16 PM

## 2020-10-13 NOTE — Progress Notes (Signed)
Physical Therapy Session Note  Patient Details  Name: Amber [name] MRN: [id number] Date of Birth: 1970/09/28  Today's Date: 10/13/2020 PT Individual Time: 0900-1000 PT Individual Time Calculation (min): 60 min   Short Term Goals: Week 1:  PT Short Term Goal 1 (Week 1): Pt will perform supine<>sit supervision PT Short Term Goal 1 - Progress (Week 1): Met PT Short Term Goal 2 (Week 1): Pt will perform sit<>stands using LRAD with CGA PT Short Term Goal 2 - Progress (Week 1): Met PT Short Term Goal 3 (Week 1): Pt will perform bed<>chair transfers using LRAD with CGA PT Short Term Goal 3 - Progress (Week 1): Met PT Short Term Goal 4 (Week 1): Pt will ambulate at least 188f using LRAD with min assist of 1 PT Short Term Goal 4 - Progress (Week 1): Met PT Short Term Goal 5 (Week 1): Pt will ascend/descend 4 steps using HRs with min assist of 1 PT Short Term Goal 5 - Progress (Week 1): Met Week 2:  PT Short Term Goal 1 (Week 2): STG = LTG d/t ELOS  Skilled Therapeutic Interventions/Progress Updates:    Pt received in the care of nurse tech after shower, pt ambulated in room distances with RW and CGA, including bending to pick up objects from the floor. Pt became emotional stating that she feels her leg is finally beginning to recover. Pt donned shoes and socks independently and stood at sink for hygiene tasks with supervision. Ambulation with RW and CGA x 150 ft to day room. Pt performed side steps 2 x 10' ft each direction with no AD and CGA. Occ cueing needed for technique. Pt directed in monster walks forward and back, 2x10' ea direction with CGA, occ LOB recovered with min A. Pt then directed in an obstacle course consisting of the following: monster walks x 10', marches on airex pad, 2 hurdles. Pt navigated course x 2 with no AD and CGA-min A at times for balance. Pt then stood on ariex at window while counting colors of cars in parking deck to promote natural weight shifting. Pt returned to room  after session and remained in w/c with all needs in reach at end of session.    Therapy Documentation Precautions:  Precautions Precautions: Fall Precaution Comments: R hemiparesis Restrictions Weight Bearing Restrictions: No    Therapy/Group: Individual Therapy  OMickel Fuchs7/25/2022, 5:08 PM

## 2020-10-13 NOTE — Progress Notes (Signed)
Occupational Therapy Session Note  Patient Details  Name: Amber [name] MRN: [id number] Date of Birth: 1971-03-09  Today's Date: 10/13/2020 OT Individual Time: 1107-1200 OT Individual Time Calculation (min): 53 min    Short Term Goals: Week 2:  OT Short Term Goal 1 (Week 2): Continue working on [employer] at supervision overall.  Skilled Therapeutic Interventions/Progress Updates:    Pt in wheelchair to start with focus on LUE strengthening having her push the wheelchair with her UEs only.  She needed increased time secondary to just applying lotion to her hands prior to session.  She was able to push to the elevators and then the therapist assisted the rest of the way to the ortho gym.  Had her work on RUE reach and accuracy with use of the BITS with focus on Visual Scanning Program for intervals of 1 minute in standing.  She was able to complete several intervals with accuracy at 64-75% and average reaction time at 1.9 to 3 seconds.  Noted increased ataxia with placement of 1.5 lb and 2 lb weights to help decrease ataxia.  This helped but also slowed down reaction time secondary to adding more resistance.  Transitioned to the therapy mat where she was educated on RUE AROM and light resistance exercises following handout given.  She was able to sit and complete right elbow flexion/extension for 1 set of 10 reps using 2 lb dumbbell.  She transitioned to supine for shoulder flexion and extension without weight as well as elbow extension with arm flexed at 90 degrees and having to maintain.  Pt reports increased posterior shoulder pain when trying to bring her arm back to her side in supine once it is flexed overhead.  Had her reduce the ROM and it improved.  She was able to transfer from supine to sit with supervision and then back to the wheelchair at min guard assist.  Handout provided with education on therapy putty exercises as well, but did not attempt secondary to time limitations.  Call button and  phone in reach with safety alarm belt in place.      Therapy Documentation Precautions:  Precautions Precautions: Fall Precaution Comments: R hemiparesis Restrictions Weight Bearing Restrictions: No   Pain: Pain Assessment Pain Scale: Faces Pain Score: 0-No pain ADL: See Care Tool Section for some details of mobility and selfcare  Therapy/Group: Individual Therapy  Twan Harkin OTR/L 10/13/2020, 12:48 PM

## 2020-10-13 NOTE — Discharge Summary (Signed)
Physician Discharge Summary  Patient ID: Amber [name] MRN: [id number] DOB/AGE: 52-Apr-1972 52 y.o.  Admit date: 10/01/2020 Discharge date: 10/15/2020  Discharge Diagnoses:  Principal Problem:   Left pontine cerebrovascular accident (HCC) Permissive hypertension Mood stabilization Diabetes mellitus Thrombophlebitis Hyperlipidemia Sleep apnea research trial Obesity  Discharged Condition: Stable  Significant Diagnostic Studies: MR BRAIN WO CONTRAST  Result Date: 09/24/2020 CLINICAL DATA:  Suspected stroke EXAM: MRI HEAD WITHOUT CONTRAST TECHNIQUE: Multiplanar, multiecho pulse sequences of the brain and surrounding structures were obtained without intravenous contrast. COMPARISON:  None. FINDINGS: Brain: There is a 10 mm acute infarct within the left pons. No acute or chronic hemorrhage. Normal white matter signal, parenchymal volume and CSF spaces. The midline structures are normal. Vascular: Major flow voids are preserved. Skull and upper cervical spine: Normal calvarium and skull base. Visualized upper cervical spine and soft tissues are normal. Sinuses/Orbits:No paranasal sinus fluid levels or advanced mucosal thickening. No mastoid or middle ear effusion. Normal orbits. IMPRESSION: 10 mm acute infarct of the left pons. No hemorrhage or mass effect. Electronically Signed   By: Deatra Robinson M.D.   On: 09/24/2020 23:00   ECHOCARDIOGRAM COMPLETE BUBBLE STUDY  Result Date: 09/25/2020    ECHOCARDIOGRAM REPORT   Patient Name:   Amber [name] Date of Exam: 09/25/2020 Medical Rec #:  [id number]      Height:       67.0 in Accession #:    [id number]     Weight:       260.0 lb Date of Birth:  1970-09-27       BSA:          2.261 m Patient Age:    50 years       BP:           143/41 mmHg Patient Gender: F              HR:           84 bpm. Exam Location:  Inpatient Procedure: 2D Echo, Cardiac Doppler, Color Doppler and Saline Contrast Bubble            Study Indications:    Stroke I63.9  History:         Patient has no prior history of Echocardiogram examinations.                 Risk Factors:Hypertension, Diabetes and Dyslipidemia.  Sonographer:    Tiffany Dance Referring Phys: [id number] Angie Fava IMPRESSIONS  1. Left ventricular ejection fraction, by estimation, is 60 to 65%. The left ventricle has normal function. The left ventricle has no regional wall motion abnormalities. Left ventricular diastolic parameters are consistent with Grade II diastolic dysfunction (pseudonormalization).  2. Right ventricular systolic function is normal. The right ventricular size is normal. Tricuspid regurgitation signal is inadequate for assessing PA pressure.  3. Left atrial size was mildly dilated.  4. The mitral valve is normal in structure. No evidence of mitral valve regurgitation. No evidence of mitral stenosis.  5. The aortic valve is tricuspid. Aortic valve regurgitation is not visualized. No aortic stenosis is present.  6. The inferior vena cava is normal in size with greater than 50% respiratory variability, suggesting right atrial pressure of 3 mmHg.  7. Bubble study negative. FINDINGS  Left Ventricle: Left ventricular ejection fraction, by estimation, is 60 to 65%. The left ventricle has normal function. The left ventricle has no regional wall motion abnormalities. The left ventricular internal cavity size was normal in size. There is  no left ventricular hypertrophy. Left ventricular diastolic parameters are consistent with Grade II diastolic dysfunction (pseudonormalization). Right Ventricle: The right ventricular size is normal. No increase in right ventricular wall thickness. Right ventricular systolic function is normal. Tricuspid regurgitation signal is inadequate for assessing PA pressure. Left Atrium: Left atrial size was mildly dilated. Right Atrium: Right atrial size was normal in size. Pericardium: There is no evidence of pericardial effusion. Mitral Valve: The mitral valve is normal in structure. No  evidence of mitral valve regurgitation. No evidence of mitral valve stenosis. Tricuspid Valve: The tricuspid valve is normal in structure. Tricuspid valve regurgitation is trivial. Aortic Valve: The aortic valve is tricuspid. Aortic valve regurgitation is not visualized. No aortic stenosis is present. Pulmonic Valve: The pulmonic valve was normal in structure. Pulmonic valve regurgitation is not visualized. Aorta: The aortic root is normal in size and structure. Venous: The inferior vena cava is normal in size with greater than 50% respiratory variability, suggesting right atrial pressure of 3 mmHg. IAS/Shunts: Bubble study negative. Agitated saline contrast was given intravenously to evaluate for intracardiac shunting.  LEFT VENTRICLE PLAX 2D LVIDd:         4.80 cm  Diastology LVIDs:         3.80 cm  LV e' medial:    6.34 cm/s LV PW:         1.30 cm  LV E/e' medial:  14.2 LV IVS:        0.90 cm  LV e' lateral:   9.88 cm/s LVOT diam:     2.00 cm  LV E/e' lateral: 9.1 LV SV:         91 LV SV Index:   40 LVOT Area:     3.14 cm  RIGHT VENTRICLE             IVC RV Basal diam:  2.60 cm     IVC diam: 1.25 cm RV S prime:     14.50 cm/s TAPSE (M-mode): 2.5 cm LEFT ATRIUM             Index       RIGHT ATRIUM           Index LA diam:        3.90 cm 1.73 cm/m  RA Area:     11.00 cm LA Vol (A2C):   68.6 ml 30.35 ml/m RA Volume:   20.60 ml  9.11 ml/m LA Vol (A4C):   34.6 ml 15.31 ml/m LA Biplane Vol: 49.1 ml 21.72 ml/m  AORTIC VALVE LVOT Vmax:   126.00 cm/s LVOT Vmean:  93.800 cm/s LVOT VTI:    0.289 m  AORTA Ao Root diam: 3.00 cm Ao Asc diam:  2.80 cm MITRAL VALVE MV Area (PHT): 3.91 cm    SHUNTS MV Decel Time: 194 msec    Systemic VTI:  0.29 m MV E velocity: 90.30 cm/s  Systemic Diam: 2.00 cm MV A velocity: 64.60 cm/s MV E/A ratio:  1.40 Marca Ancona MD Electronically signed by Marca Ancona MD Signature Date/Time: 09/25/2020/3:58:15 PM    Final    CT HEAD CODE STROKE WO CONTRAST  Result Date: 09/24/2020 CLINICAL  DATA:  Code stroke.  Right-sided weakness. EXAM: CT HEAD WITHOUT CONTRAST TECHNIQUE: Contiguous axial images were obtained from the base of the skull through the vertex without intravenous contrast. COMPARISON:  None. FINDINGS: Brain: Abnormal hypoattenuation within the left pons and left thalamus, concerning for age indeterminate infarcts. No significant mass effect. No acute hemorrhage, hydrocephalus,  midline shift, extra-axial fluid collection. Vascular: No hyperdense vessel identified. Calcific atherosclerosis. Skull: No acute fracture. Sinuses/Orbits: Mucosal thickening of a posterior right ethmoid air cell. Unremarkable orbits. Other: No mastoid effusions. IMPRESSION: Abnormal hypoattenuation within the left pons and left thalamus, concerning for age indeterminate infarcts that could be acute/subacute. Recommend MRI to further evaluate. Code stroke imaging results were communicated on 09/24/2020 at 4:36 pm to provider Dr. Amada JupiterKirkpatrick via telephone, who verbally acknowledged these results. Electronically Signed   By: Feliberto HartsFrederick S Jones MD   On: 09/24/2020 16:39   CT ANGIO HEAD NECK W WO CM (CODE STROKE)  Result Date: 09/24/2020 CLINICAL DATA:  Right-sided weakness. EXAM: CT ANGIOGRAPHY HEAD AND NECK TECHNIQUE: Multidetector CT imaging of the head and neck was performed using the standard protocol during bolus administration of intravenous contrast. Multiplanar CT image reconstructions and MIPs were obtained to evaluate the vascular anatomy. Carotid stenosis measurements (when applicable) are obtained utilizing NASCET criteria, using the distal internal carotid diameter as the denominator. CONTRAST:  75mL OMNIPAQUE IOHEXOL 350 MG/ML SOLN COMPARISON:  None. FINDINGS: CTA NECK FINDINGS Aortic arch: Normal variant 4 vessel aortic arch with the left vertebral artery arising from the arch. Widely patent brachiocephalic and subclavian arteries. Right carotid system: Patent with a small amount of calcified plaque in  the carotid bulb. No evidence of a significant stenosis or dissection. Left carotid system: Patent with a small to moderate amount of calcified and soft plaque at the carotid bifurcation. No evidence of a significant stenosis or dissection. Vertebral arteries: Patent and small bilaterally with the left being particularly hypoplastic. No evidence of a significant stenosis or dissection. Skeleton: Mild-to-moderate disc and moderate facet degeneration in the cervical spine. Other neck: No evidence of cervical lymphadenopathy or mass. Upper chest: Clear lung apices. Review of the MIP images confirms the above findings CTA HEAD FINDINGS Anterior circulation: The internal carotid arteries are patent from skull base to carotid termini with mild atherosclerotic irregularity but no significant stenosis. ACAs and MCAs are patent with mild-to-moderate branch vessel irregularity but no evidence of a proximal branch occlusion or significant proximal stenosis. No aneurysm is identified. Posterior circulation: The intracranial vertebral arteries are patent with the left ending in PICA. The basilar artery is patent and congenitally small in caliber with diffuse irregularity as well as a moderate focal stenosis in its midportion. There are fetal origins of both PCAs. Both PCAs are patent with diffuse irregularity. There is a severe proximal right P2 stenosis. No aneurysm is identified. Venous sinuses: Patent. Anatomic variants: Fetal PCAs. Review of the MIP images confirms the above findings IMPRESSION: 1. No emergent large vessel occlusion. 2. 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 3. Cervical carotid atherosclerosis without stenosis. These results were communicated to Dr. Amada JupiterKirkpatrick at 4:57 pm on 09/24/2020 by text page via the [facility] messaging system. Electronically Signed   By: Sebastian AcheAllen  Grady M.D.   On: 09/24/2020 17:06    Labs:  Basic Metabolic Panel: No results for  input(s): NA, K, CL, CO2, GLUCOSE, BUN, CREATININE, CALCIUM, MG, PHOS in the last 168 hours.  CBC: No results for input(s): WBC, NEUTROABS, HGB, HCT, MCV, PLT in the last 168 hours.  CBG: Recent Labs  Lab 10/13/20 2109 10/14/20 0601 10/14/20 1200 10/14/20 1701 10/14/20 2100  GLUCAP 163* 145* 106* 109* 100*   Family history.  Positive for hypertension as well as hyperlipidemia.  Denies any colon cancer esophageal cancer or rectal cancer  Brief HPI:   Amber BarthelemySabrina  [name] is a 50 y.o. right-handed female with history of hypertension hyperlipidemia type 2 diabetes mellitus.  Per chart review she lives in an apartment in her mother's basement.  Independent prior to admission.  She has 14 steps down to the living room.  She works full-time as a Engineer, site.  Presented 09/24/2020 after being found down for an extended amount of time with right side weakness as well as facial droop.  Cranial CT scan showed abnormal hypoattenuation within the left pons and left thalamus concerning for age-indeterminate infarction.  CT angiogram head and neck no emergent large vessel occlusion.  Patient did not receive tPA.  MRI showed a 10 mm acute infarction in the left pons.  No hemorrhage or mass-effect.  Echocardiogram with ejection fraction of 60 to 65% grade 2 diastolic dysfunction.  No regional wall motion abnormalities.  Admission chemistries unremarkable except sodium 133 glucose 310 CK 1112 urinalysis positive nitrite.  Maintained on aspirin as well as Plavix for CVA prophylaxis x3 months then aspirin alone.  Hospital course patient did develop some right antecubital redness and swelling suspect thrombophlebitis placed on 7-day course of Keflex.  Tolerating a regular consistency diet.  Therapy evaluations completed due to patient's right side weakness decreased functional mobility was admitted for a comprehensive rehab program.   Hospital Course: Shekelia Boutin was admitted to rehab 10/01/2020 for inpatient therapies  to consist of PT, ST and OT at least three hours five days a week. Past admission physiatrist, therapy team and rehab RN have worked together to provide customized collaborative inpatient rehab.  Pertaining to patient's left pontine infarction likely from symptomatic moderate distal basilar stenosis remained stable she will continue aspirin and Plavix x3 months then aspirin alone with follow-up per neurology services.  Pain managed use of Flexeril as needed.  Mood stabilization with Zoloft she was attending full therapies.  Permissive hypertension and his Zestoretic was resumed.  Patient would need outpatient follow-up with PCP..  Diabetes mellitus hemoglobin A1c 11.7 insulin therapy as directed full diabetic teaching completed.  She did complete a course of Keflex for thrombophlebitis resolved.  Lipitor ongoing for hyperlipidemia.  She had been placed in a sleep apnea research trial.  Noted obesity BMI 37.90 dietary follow-up.   Blood pressures were monitored on TID basis and controlled  Diabetes has been monitored with ac/hs CBG checks and SSI was use prn for tighter BS control.    Rehab course: During patient's stay in rehab weekly team conferences were held to monitor patient's progress, set goals and discuss barriers to discharge. At admission, patient required minimal assist sit to stand minimal assist supine to sit minimal guard 45 feet rolling walker minimal assist upper body bathing moderate assist lower body bathing minimal assist upper body dressing moderate assist lower body dressing  Physical exam.  Blood pressure 135/82 pulse 79 temperature 98.3 respirations 16 oxygen saturation is 95% room air Constitutional.  No acute distress HEENT Head.  Normocephalic and atraumatic Eyes.  Pupils round and reactive to light no discharge without nystagmus Neck.  Supple nontender no JVD without thyromegaly Cardiac regular rate and rhythm not any extra sounds or murmur heard Abdomen.  Soft nontender  positive bowel sounds without rebound Respiratory effort normal no respiratory distress without wheeze Extremities.  No clubbing cyanosis or edema Neurologic.  Cranial nerves II through XII intact motor strength 5/5 in the left and 3/5 right deltoid bicep tricep grip hip flexor knee extensor ankle dorsi plantarflexion. Sensory exam intact  He/She  has had  improvement in activity tolerance, balance, postural control as well as ability to compensate for deficits. He/She has had improvement in functional use RUE/LUE  and RLE/LLE as well as improvement in awareness.  Perform stepping over dividers on floor with reciprocal gait occasional loss of balance.  Patient ambulates multiple short trials without assistive device.  She can ambulate extended distances rolling walker.  With contact-guard assist.  Perform sit to stand transfers to upper extremity assist.  Ambulates through obstacles including stepping over cord with contact-guard.  Patient gather his belongings for activities of daily living and homemaking.  Minimal assist upper body bathing minimal assist lower body bathing moderate assist upper body dressing moderate assist lower body dressing.  Full family teaching completed plan discharged to home       Disposition: Discharged to home    Diet: Diabetic diet  Special Instructions: No driving smoking or alcohol  Plan aspirin 81 mg daily and Plavix 75 mg daily times a total of 3 months then aspirin alone   Medications at discharge. 1.  Tylenol as needed 2.  Aspirin 81 mg p.o. daily 3.  Lipitor 40 mg p.o. nightly 4.  Plavix 75 mg p.o. daily until January 02, 2021 and stop 5.  Glucotrol 5 mg p.o. daily 6.  Zestoretic 20-12.5 mg daily 7.  NovoLog 10 units 3 times daily with meals 8.  Lantus insulin 70 units daily 9.  Zoloft 100 mg p.o. daily  30-35 minutes were spent completing discharge summary and discharge planning Discharge Instructions     Ambulatory referral to Neurology    Complete by: As directed    An appointment is requested in approximately: 4 weeks left pontine infarction   Ambulatory referral to Physical Medicine Rehab   Complete by: As directed    Moderate complexity follow-up 1 to 2 weeks left posterior infarction   Ambulatory referral to Physical Medicine Rehab   Complete by: As directed    Moderate complexity follow-up 1 to 2 weeks left pontine infarction        Follow-up Information     Kirsteins, Victorino Sparrow, MD Follow up.   Specialty: Physical Medicine and Rehabilitation Why: Office to call for appointment Contact information: [address] [place] [address] [place] [address] [phone]                 Signed: Charlton Amor 10/15/2020, 5:26 AM

## 2020-10-13 NOTE — Progress Notes (Signed)
Physical Therapy Session Note  Patient Details  Name: Amber [name] MRN: [id number] Date of Birth: [birth year]  Today's Date: 10/13/2020 PT Individual Time: 1545-1700 PT Individual Time Calculation (min): 75 min   Short Term Goals: Week 2:  PT Short Term Goal 1 (Week 2): STG = LTG d/t ELOS  Skilled Therapeutic Interventions/Progress Updates:    Pt received seated in bed, agreeable to PT session, no complaints of pain. Bed mobility Supervision. Sit to stand with Supervision with and without AD throughout session. Pt excited this afternoon by improved ability to control RLE and decreased hyperextension during mobility. Ambulation to/from therapy gym with RW and close Supervision to CGA for balance, occasional instances of hyperextension that increase with fatigue. Standing R knee TKE 2 x 15 reps with orange theraband. Standing RLE hip flexion/extension on stool in // bars with assist for controlling stool but pt able to perform flexion and extension without assist, 2 x 15 reps. Sidesteps L/R 2 x 10 ft each direction with orange theraband around ankles in // bars with CGA for balance for hip strengthening. Standing LLE 3" step-downs with focus on R knee control 2 x 15 reps with BUE support and CGA for balance. Attempt eccentric step-downs from 6" step but pt has onset of knee pain from prior injury. Pt returned to bed at end of session at Supervision level. Pt left seated in bed with needs in reach, bed alarm in place.  Therapy Documentation Precautions:  Precautions Precautions: Fall Precaution Comments: R hemiparesis Restrictions Weight Bearing Restrictions: No    Therapy/Group: Individual Therapy   Peter Congo, PT, DPT, CSRS  10/13/2020, 5:10 PM

## 2020-10-14 LAB — GLUCOSE, CAPILLARY
Glucose-Capillary: 145 mg/dL — ABNORMAL HIGH (ref 70–99)
Glucose-Capillary: 106 mg/dL — ABNORMAL HIGH (ref 70–99)
Glucose-Capillary: 109 mg/dL — ABNORMAL HIGH (ref 70–99)
Glucose-Capillary: 100 mg/dL — ABNORMAL HIGH (ref 70–99)

## 2020-10-14 MED ORDER — LISINOPRIL-HYDROCHLOROTHIAZIDE 20-12.5 MG PO TABS: 1.0000 | ORAL_TABLET | Freq: Every day | ORAL | 0 refills | Status: DC

## 2020-10-14 MED ORDER — MICONAZOLE NITRATE POWD: 1.0000 "application " | Freq: Two times a day (BID) | 0 refills | Status: DC

## 2020-10-14 MED ORDER — NOVOLOG FLEXPEN RELION 100 UNIT/ML ~~LOC~~ SOPN: 10.0000 [IU] | PEN_INJECTOR | Freq: Three times a day (TID) | SUBCUTANEOUS | 11 refills | Status: DC

## 2020-10-14 MED ORDER — CLOPIDOGREL BISULFATE 75 MG PO TABS: 75.0000 mg | ORAL_TABLET | Freq: Every day | ORAL | 2 refills | Status: DC

## 2020-10-14 MED ORDER — SERTRALINE HCL 100 MG PO TABS
100.0000 mg | ORAL_TABLET | Freq: Every day | ORAL | 0 refills | Status: DC
Start: 2020-10-14 — End: 2020-11-18

## 2020-10-14 MED ORDER — ATORVASTATIN CALCIUM 40 MG PO TABS: 40.0000 mg | ORAL_TABLET | Freq: Every day | ORAL | 0 refills | Status: DC

## 2020-10-14 MED ORDER — LANTUS SOLOSTAR 100 UNIT/ML ~~LOC~~ SOPN: 71.0000 [IU] | PEN_INJECTOR | Freq: Every day | SUBCUTANEOUS | 11 refills | Status: DC

## 2020-10-14 MED ORDER — GLIPIZIDE 5 MG PO TABS
5.0000 mg | ORAL_TABLET | Freq: Every day | ORAL | 0 refills | Status: DC
Start: 2020-10-14 — End: 2020-11-18

## 2020-10-14 MED ORDER — CYCLOBENZAPRINE HCL 5 MG PO TABS: 5.0000 mg | ORAL_TABLET | Freq: Three times a day (TID) | ORAL | 0 refills | Status: DC | PRN

## 2020-10-14 NOTE — Progress Notes (Signed)
Physical Therapy Discharge Summary  Patient Details  Name: Amber [name] MRN: [id number] Date of Birth: [birth year]  Today's Date: 10/14/2020 PT Individual Time: 1425-1505 PT Individual Time Calculation (min): 40 min    Patient has met 9 of 9 long term goals due to improved activity tolerance, improved balance, improved postural control, increased strength, ability to compensate for deficits, functional use of  left upper extremity and left lower extremity, improved awareness, and improved coordination.  Patient to discharge at an ambulatory level using RW with Supervision.   Patient's care partner attended hands-on education/training and is independent to provide the necessary physical assistance at discharge.  All goals met.  Recommendation:  Patient will benefit from ongoing skilled PT services in outpatient setting to continue to advance safe functional mobility, address ongoing impairments in L LE NMR, dynamic standing balance, dynamic gait training with LRAD, cardiopulmonary endurance, and minimize fall risk.  Equipment: RW  Reasons for discharge: treatment goals met and discharge from hospital  Patient/family agrees with progress made and goals achieved: Yes  Skilled Therapeutic Interventions/Progress Updates:  Pt received sitting in gym as hand-off from OT. Pt agreeable to therapy session and pt's significant other present for hands-on education and training. Therapist educated pt/SO on proper stair navigation technique with RW management. Pt ascended/descended 4 steps 1x with therapist's assistance and 1x with S.O. assistance demonstrating understanding of proper technique with only min cuing. Pt ambulated ~14f 2x using RW with pt's S.O. providing proper CGA/supervision for safety - therapist educated on dual-task safety awareness in the home. Pt ambulated ~128fup/down ramp using RW with pt's S.O. providing proper assistance - therapist educating on increased importance of R  knee  control during descent. Therapist provided pt with the following HEP and educated on safe set-up of each exercise. Educated on proper curb navigation technique using RW  - pt demonstrated understanding with CGA from S.O. Pt ambulated back to room with therapist educating pt's S.O. on effects of fatigue on pt's gait quality and importance of taking rest breaks for safety. Educated on importance of calling 911 in event of a fall. Pt left sitting in w/c at end of session with needs in reach and S.O. present.   Access Code: [email or web address] URL: [email or web address] Date: 10/14/2020 Prepared by: CaPage SpiroExercises Supine Bridge - 1 x daily - 7 x weekly - 2 sets - 15 reps Sit to Stand with Hands on Knees - 1 x daily - 7 x weekly - 2 sets - 10 reps Side Stepping with Resistance at Thighs and Counter Support - 1 x daily - 7 x weekly - 2 sets - 20 reps Alternating Step Taps with Counter Support - 1 x daily - 7 x weekly - 2 sets - 15 reps Walking - 1 x daily - 7 x weekly - 3 sets - 2 minutes hold   PT Discharge Precautions/Restrictions Precautions Precautions: Fall;Other (comment) Precaution Comments: mild R hemiparesis Restrictions Weight Bearing Restrictions: No Pain Pain Assessment Pain Scale: 0-10 Pain Score: 0-No pain Perception  Perception Perception: Within Functional Limits Praxis Praxis: Intact  Cognition Overall Cognitive Status: Within Functional Limits for tasks assessed Arousal/Alertness: Awake/alert Orientation Level: Oriented X4 Attention: Focused;Sustained Focused Attention: Appears intact Sustained Attention: Appears intact Memory: Appears intact Awareness: Appears intact Problem Solving: Appears intact Safety/Judgment: Appears intact Sensation Sensation Light Touch: Appears Intact Hot/Cold: Not tested Proprioception: Appears Intact Stereognosis: Not tested Coordination Gross Motor Movements are Fluid and Coordinated: No Coordination and Movement  Description: Pt remains with mild  R hemiparesis (UE>LE) resulting in impaired coordination Motor  Motor Motor: Hemiplegia Motor - Discharge Observations: continues with mild R hemiparesis (UE>LE)  Mobility Bed Mobility Bed Mobility: Sit to Supine;Supine to Sit Supine to Sit: Independent with assistive device Sit to Supine: Independent with assistive device Transfers Transfers: Sit to Stand;Stand to Sit;Stand Pivot Transfers Sit to Stand: Independent with assistive device Stand to Sit: Independent with assistive device Stand Pivot Transfers: Supervision/Verbal cueing Stand Pivot Transfer Details: Verbal cues for safe use of DME/AE Transfer (Assistive device): Rolling walker Locomotion  Gait Ambulation: Yes Gait Assistance: Supervision/Verbal cueing Gait Distance (Feet): 150 Feet Assistive device: Rolling walker Gait Assistance Details: Verbal cues for gait pattern Gait Gait: Yes Gait Pattern: Impaired Gait Pattern:  (overall demos slight impaired coordination compared to L LE that is more exaggerated with fatigue showing decreased R LE foot clearance with slight toe catching and more difficulty maintaining R knee stability to avoid hyperextension) Gait velocity: decreased Stairs / Additional Locomotion Stairs: Yes Stairs Assistance: Contact Guard/Touching assist Stair Management Technique: Two rails Number of Stairs: 12 Height of Stairs: 6 Ramp: Supervision/Verbal cueing Curb: Nurse, mental health Mobility: No  Trunk/Postural Assessment  Cervical Assessment Cervical Assessment: Within Functional Limits Thoracic Assessment Thoracic Assessment: Exceptions to [facility] (rounded posture) Lumbar Assessment Lumbar Assessment: Exceptions to WFL (slight posterior pelvic tilt in sitting)  Balance Balance Balance Assessed: Yes Standardized Balance Assessment Standardized Balance Assessment: Berg Balance Test Berg Balance Test Sit to Stand:  Able to stand without using hands and stabilize independently Standing Unsupported: Able to stand safely 2 minutes Sitting with Back Unsupported but Feet Supported on Floor or Stool: Able to sit safely and securely 2 minutes Stand to Sit: Sits safely with minimal use of hands Transfers: Able to transfer safely, minor use of hands Standing Unsupported with Eyes Closed: Able to stand 10 seconds safely Standing Ubsupported with Feet Together: Able to place feet together independently and stand for 1 minute with supervision From Standing, Reach Forward with Outstretched Arm: Can reach confidently >25 cm (10") From Standing Position, Pick up Object from Floor: Able to pick up shoe, needs supervision From Standing Position, Turn to Look Behind Over each Shoulder: Looks behind from both sides and weight shifts well Turn 360 Degrees: Able to turn 360 degrees safely one side only in 4 seconds or less Standing Unsupported, Alternately Place Feet on Step/Stool: Able to complete >2 steps/needs minimal assist Standing Unsupported, One Foot in Front: Able to take small step independently and hold 30 seconds Standing on One Leg: Able to lift leg independently and hold equal to or more than 3 seconds Total Score: 46 Static Sitting Balance Static Sitting - Balance Support: Feet supported Static Sitting - Level of Assistance: 6: Modified independent (Device/Increase time) Dynamic Sitting Balance Dynamic Sitting - Balance Support: During functional activity Dynamic Sitting - Level of Assistance: 6: Modified independent (Device/Increase time) Static Standing Balance Static Standing - Balance Support: During functional activity;Bilateral upper extremity supported Static Standing - Level of Assistance: 5: Stand by assistance Dynamic Standing Balance Dynamic Standing - Balance Support: During functional activity;Bilateral upper extremity supported Dynamic Standing - Level of Assistance: 5: Stand by  assistance;Other (comment) (CGA) Extremity Assessment      RLE Assessment RLE Assessment: Exceptions to [facility] Active Range of Motion (AROM) Comments: WFL RLE Strength Right Hip Flexion: 4-/5 Right Knee Flexion: 4-/5 Right Knee Extension: 4+/5 Right Ankle Dorsiflexion: 4/5 Right Ankle Plantar Flexion: 4/5 RLE Tone RLE Tone: Within Functional Limits LLE  Assessment LLE Assessment: Within Functional Limits General Strength Comments: assessed to be 5/5 throughout in sitting    Tawana Scale , PT, DPT, NCS, CSRS 10/14/2020, 7:52 AM

## 2020-10-14 NOTE — Progress Notes (Signed)
Patient ID: Amber [name], female   DOB: 1970-06-19, 50 y.o.   MRN: [id number]   Patient referral faxed to [facility]  [place], [place] [phone]

## 2020-10-14 NOTE — Progress Notes (Signed)
Physical Therapy Session Note  Patient Details  Name: Amber [name] MRN: [id number] Date of Birth: 1970/04/22  Today's Date: 10/14/2020 PT Individual Time: 1010-1106 PT Individual Time Calculation (min): 56 min   Short Term Goals: Week 2:  PT Short Term Goal 1 (Week 2): STG = LTG d/t ELOS  Skilled Therapeutic Interventions/Progress Updates:    Pt received sitting in w/c and eager to participate in therapy session stating she had a significant improvement in R LE movement yesterday and is excited to show this therapist. Sit>stand w/c>RW mod-I. Gait training ~150ft to main therapy gym using RW with close supervision for safety - demos improved  RLE gait mechanics with good foot clearance and good knee control during stance (not moving into hyperextension though does intermittently bias slight flexed position to avoid this) - does demos light impaired R LE coordination during swing compared to L. Stair navigation training ascending/descending 4 steps 3x using B HRs with CGA for safety - required cuing 2x to perform step-to technique leading with L LE on ascent and  RLE on descent to increase safety with pt demoing understanding. Pt able to verbalize education from prior therapist on pet safety and sitting prior to allowing interactions with dogs to decrease risk for falling. Gait training ~154ft using RW as described above. Patient participated in [facility] and demonstrates increased fall risk as noted by score of  46/56; however, this is a significant improvement compared to 29/56 on initial eval.  (<36= high risk for falls, close to 100%; 37-45 significant >80%; 46-51 moderate >50%; 52-55 lower >25%). Therapist educated pt on findings and fall risk. Gait training ~239ft back to room using RW with close supervision as pt demos decreased R LE foot clearance with fatigue and tendency to move towards excessive knee extension; however, pt self recognizes and takes standing rest breaks prior to  continuing ambulating. Pt's significant other in room and requests to show him short distance ambulation without AD - gait training ~55ft no AD with CGA for steadying and has slightly wider BOS. Pt left sitting in w/c with needs in reach, significant other present, and seat belt alarm on.  Therapy Documentation Precautions:  Precautions Precautions: Fall Precaution Comments: R hemiparesis Restrictions Weight Bearing Restrictions: No  Pain:  Denies pain during session.  Balance Balance Balance Assessed: Yes Standardized Balance Assessment Standardized Balance Assessment: Berg Balance Test Berg Balance Test Sit to Stand: Able to stand without using hands and stabilize independently Standing Unsupported: Able to stand safely 2 minutes Sitting with Back Unsupported but Feet Supported on Floor or Stool: Able to sit safely and securely 2 minutes Stand to Sit: Sits safely with minimal use of hands Transfers: Able to transfer safely, minor use of hands Standing Unsupported with Eyes Closed: Able to stand 10 seconds safely Standing Ubsupported with Feet Together: Able to place feet together independently and stand for 1 minute with supervision From Standing, Reach Forward with Outstretched Arm: Can reach confidently >25 cm (10") From Standing Position, Pick up Object from Floor: Able to pick up shoe, needs supervision From Standing Position, Turn to Look Behind Over each Shoulder: Looks behind from both sides and weight shifts well Turn 360 Degrees: Able to turn 360 degrees safely one side only in 4 seconds or less Standing Unsupported, Alternately Place Feet on Step/Stool: Able to complete >2 steps/needs minimal assist Standing Unsupported, One Foot in Front: Able to take small step independently and hold 30 seconds Standing on One Leg: Able to lift leg  independently and hold equal to or more than 3 seconds Total Score: 46  Therapy/Group: Individual Therapy  Ginny Forth , PT, DPT, NCS,  CSRS 10/14/2020, 7:55 AM

## 2020-10-14 NOTE — Progress Notes (Signed)
Occupational Therapy Session Note  Patient Details  Name: Amber [name] MRN: [id number] Date of Birth: 1971/03/04  Today's Date: 10/14/2020 OT Individual Time: 1194-1740 OT Individual Time Calculation (min): 58 min    Short Term Goals: Week 2:  OT Short Term Goal 1 (Week 2): Continue working on [employer] at supervision overall.  Skilled Therapeutic Interventions/Progress Updates:    Pt worked on bathing and dressing to start session.  She was able to use the RW for support to complete all transfers in the room as well as gathering clothing with supervision.  Shower was completed with supervision sit to stand on the shower seat.  She then transferred out to the EOB where he completed dressing.  She was then able to complete cleaning up her dirty clothing and towels with supervision with use of the RW for support.  Ambulated out to the dayroom with supervision using the RW for work on RUE coordination.  She was able to complete 9 hole peg test in 27 seconds with the left and 53 seconds with the right.  Box and Blocks test was completed with the RUE as well with 27 blocks being picked up in 1 minute.  Finished with work on trying to [employer] the blocks with the RUE.  She was able to stack seven total before it fell down.  Ambulated back to the room at supervision with pt left sitting up in the wheelchair with the call button and phone in reach and safety belt in place.    Therapy Documentation Precautions:  Precautions Precautions: Fall, Other (comment) Precaution Comments: mild R hemiparesis Restrictions Weight Bearing Restrictions: No  Pain: Pain Assessment Pain Scale: 0-10 Pain Score: 0-No pain ADL: See Care Tool Section for some details of mobility and selfcare  Therapy/Group: Individual Therapy  Kevonta Phariss OTR/L 10/14/2020, 12:26 PM

## 2020-10-14 NOTE — Plan of Care (Signed)
  Problem: Consults Goal: RH STROKE PATIENT EDUCATION Description: See Patient Education module for education specifics  Outcome: Progressing   Problem: RH BOWEL ELIMINATION Goal: RH STG MANAGE BOWEL WITH ASSISTANCE Description: STG Manage Bowel with mod I  Assistance. Outcome: Progressing Goal: RH STG MANAGE BOWEL W/MEDICATION W/ASSISTANCE Description: STG Manage Bowel with Medication with mod I  Assistance. Outcome: Progressing   Problem: RH SAFETY Goal: RH STG ADHERE TO SAFETY PRECAUTIONS W/ASSISTANCE/DEVICE Description: STG Adhere to Safety Precautions With cues/reminders Assistance/Device. Outcome: Progressing   Problem: RH PAIN MANAGEMENT Goal: RH STG PAIN MANAGED AT OR BELOW PT'S PAIN GOAL Description: At or below level 4 Outcome: Progressing   Problem: RH KNOWLEDGE DEFICIT Goal: RH STG INCREASE KNOWLEDGE OF DIABETES Description:  Patient will be able to manage DM with medications and dietary modifications using handouts and educational resources independently Outcome: Progressing Goal: RH STG INCREASE KNOWLEDGE OF HYPERTENSION Description: Patient will be able to manage HTN with medications and dietary modifications using handouts and educational resources independently Outcome: Progressing Goal: RH STG INCREASE KNOWLEGDE OF HYPERLIPIDEMIA Description: Patient will be able to manage HLD with medications and dietary modifications using handouts and educational resources independently Outcome: Progressing Goal: RH STG INCREASE KNOWLEDGE OF STROKE PROPHYLAXIS Description: Patient will be able to manage secondary stroke risks with medications and dietary modifications using handouts and educational resources independently Outcome: Progressing   

## 2020-10-14 NOTE — Progress Notes (Signed)
PROGRESS NOTE   Subjective/Complaints:  No issues overnite , sore from therapy, will be going to fiancee' home tomorrow ROS- Pt denies SOB, abd pain, CP, N/V/C/D, and vision changes  Objective:   No results found. No results for input(s): WBC, HGB, HCT, PLT in the last 72 hours. No results for input(s): NA, K, CL, CO2, GLUCOSE, BUN, CREATININE, CALCIUM in the last 72 hours.  Intake/Output Summary (Last 24 hours) at 10/14/2020 0815 Last data filed at 10/13/2020 1407 Gross per 24 hour  Intake 236 ml  Output 1 ml  Net 235 ml         Physical Exam: Vital Signs Blood pressure 117/75, pulse 71, temperature 98.4 F (36.9 C), resp. rate 18, height 5\' 10"  (1.778 m), weight 119.8 kg, SpO2 97 %.  General: No acute distress Mood and affect are appropriate Heart: Regular rate and rhythm no rubs murmurs or extra sounds Lungs: Clear to auscultation, breathing unlabored, no rales or wheezes Abdomen: Positive bowel sounds, soft nontender to palpation, nondistended Extremities: No clubbing, cyanosis, or edema  Skin: No evidence of breakdown, no evidence of rash Neurologic: Cranial nerves II through XII intact, motor strength is 5/5 in left 4+/5 RIght deltoid, bicep, tricep, grip, hip flexor, knee extensors, ankle dorsiflexor and plantar flexor Sensory exam normal sensation to light touch and proprioception in bilateral upper and lower extremities  Musculoskeletal: Full range of motion in all 4 extremities. No joint swelling    Assessment/Plan: 1. Functional deficits which require 3+ hours per day of interdisciplinary therapy in a comprehensive inpatient rehab setting. Physiatrist is providing close team supervision and 24 hour management of active medical problems listed below. Physiatrist and rehab team continue to assess barriers to discharge/monitor patient progress toward functional and medical goals  Care Tool:  Bathing     Body parts bathed by patient: Right arm, Chest, Abdomen, Right upper leg, Left upper leg, Right lower leg, Left lower leg, Face, Front perineal area, Buttocks, Left arm   Body parts bathed by helper: Left arm     Bathing assist Assist Level: Contact Guard/Touching assist     Upper Body Dressing/Undressing Upper body dressing   What is the patient wearing?: Pull over shirt    Upper body assist Assist Level: Supervision/Verbal cueing    Lower Body Dressing/Undressing Lower body dressing      What is the patient wearing?: Underwear/pull up, Pants     Lower body assist Assist for lower body dressing: Minimal Assistance - Patient > 75%     Toileting Toileting    Toileting assist Assist for toileting: Moderate Assistance - Patient 50 - 74%     Transfers Chair/bed transfer  Transfers assist     Chair/bed transfer assist level: Supervision/Verbal cueing     Locomotion Ambulation   Ambulation assist      Assist level: Contact Guard/Touching assist Assistive device: Walker-rolling Max distance: 150'   Walk 10 feet activity   Assist     Assist level: Contact Guard/Touching assist Assistive device: Walker-rolling   Walk 50 feet activity   Assist    Assist level: Contact Guard/Touching assist Assistive device: Walker-rolling    Walk 150 feet activity  Assist Walk 150 feet activity did not occur: Safety/medical concerns  Assist level: Contact Guard/Touching assist Assistive device: Walker-rolling    Walk 10 feet on uneven surface  activity   Assist Walk 10 feet on uneven surfaces activity did not occur: Safety/medical concerns         Wheelchair     Assist Will patient use wheelchair at discharge?: No             Wheelchair 50 feet with 2 turns activity    Assist            Wheelchair 150 feet activity     Assist          Blood pressure 117/75, pulse 71, temperature 98.4 F (36.9 C), resp. rate 18, height  5\' 10"  (1.778 m), weight 119.8 kg, SpO2 97 %.  Medical Problem List and Plan: 1.  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  Antithrombotics: -DVT/anticoagulation: SCDs             -antiplatelet therapy: Aspirin 81 mg daily and Plavix 75 mg daily x3 months then aspirin alone 3. Pain Management: Flexeril 5 mg 3 times daily as needed, Tylenol as needed  7/17- denies pain- con't regimen prn 4. Depression: Continue Zoloft 100 mg daily             -antipsychotic agents: N/A 5. Neuropsych: This patient is capable of making decisions on her own behalf. 6. Skin/Wound Care: Routine skin checks 7. Fluids/Electrolytes/Nutrition: Routine in and outs with follow-up chemistries 8.  Permissive hypertension.  Presently on HCTZ 12.5 mg daily.  Patient on Zestoretic 20-12.5 mg twice daily prior to admission.  Resume as needed Vitals:   10/13/20 2000 10/14/20 0427  BP: 114/86 117/75  Pulse: 80 71  Resp:  18  Temp: 98.6 F (37 C) 98.4 F (36.9 C)  SpO2: 98% 97%  Controlled 7/26 9.  Diabetes mellitus.  Hemoglobin A1c 11.7.  NovoLog 8 units 3 times daily, Lantus insulin 70 units daily, Glucotrol 5 mg daily.  Check blood sugars before meals and at bedtime CBG (last 3)  Recent Labs    10/13/20 1710 10/13/20 2109 10/14/20 0601  GLUCAP 124* 163* 145*     Improving increased novolog to 10U on 7/21,   7/25: increase Lantus to 71U 7/26 controlled , will tighten control, increase Lantus to 75U 10.  Thrombophlebitis. Resolved  Completed Keflex 11.  Hyperlipidemia.  Continue Lipitor  12.  On stroke /sleep apnea research trial   LOS: 13 days A FACE TO FACE EVALUATION WAS PERFORMED  8/26 10/14/2020, 8:15 AM

## 2020-10-14 NOTE — Progress Notes (Signed)
Occupational Therapy Discharge Summary  Patient Details  Name: Amber [name] MRN: [id number] Date of Birth: 1971-02-03  Today's Date: 10/14/2020 OT Individual Time: 8088-1103 OT Individual Time Calculation (min): 41 min   Session Note:  Family education session with pt and her significant other.  Educated both on therapy putty exercises with use of the medium green resistance putty.  She was able to return demonstrate completion of gross digit flexion and extension as well as tip to tip pinch.  Handout was provided previously in earlier session.  Also provided education regarding her current ADL level at supervision overall for bathing, dressing, and toileting tasks.  Recommended use of the RW for support for safety with any functional transfers and need for significant other to be positioned on the left side for support.  Had her walk down to the tub/shower room for completion of tub transfers with use of the RW and tub bench.  She was able to complete with supervision and increased time to lift the RLE over the edge.  She then ambulated to the therapy gym where she worked on RUE coordination with ball toss and catch in sitting and standing.  Close supervision for standing balance while tossing back and forth with her significant other.  Finished session with pt resting in the chair awaiting PT to take over.    Patient has met 14 of 14 long term goals due to improved activity tolerance, improved balance, postural control, ability to compensate for deficits, functional use of  RIGHT upper extremity, and improved coordination.  Patient to discharge at overall Supervision level.  Patient's care partner is independent to provide the necessary physical assistance at discharge.    Reasons goals not met: NA  Recommendation:  Patient will benefit from ongoing skilled OT services in outpatient setting to continue to advance functional skills in the area of BADL and Reduce care partner burden.  Pt will  continue to benefit from skilled outpatient OT to progress with RUE function as well as increasing independence level with selfcare tasks and IADLs.  She currently completes selfcare tasks at an overall supervision level with recommendation for 24 hr supervision for safety at current discharge.    Equipment: No equipment provided  Reasons for discharge: treatment goals met and discharge from hospital  Patient/family agrees with progress made and goals achieved: Yes  OT Discharge Precautions/Restrictions  Precautions Precautions: Fall;Other (comment) Precaution Comments: mild R hemiparesis Restrictions Weight Bearing Restrictions: No   Pain Pain Assessment Pain Scale: Faces Pain Score: 0-No pain ADL ADL Eating: Modified independent Where Assessed-Eating: Chair Grooming: Modified independent Where Assessed-Grooming: Chair Upper Body Bathing: Modified independent Where Assessed-Upper Body Bathing: Shower, Chair Lower Body Bathing: Supervision/safety Where Assessed-Lower Body Bathing: Chair, Shower Upper Body Dressing: Setup Where Assessed-Upper Body Dressing: Edge of bed Lower Body Dressing: Supervision/safety Where Assessed-Lower Body Dressing: Edge of bed Toileting: Supervision/safety Where Assessed-Toileting: Bedside Commode Toilet Transfer: Close supervision Toilet Transfer Method: Counselling psychologist: Engineer, technical sales Transfer: Close supervison Clinical cytogeneticist Method: Optometrist: Facilities manager: Close supervision Social research officer, government Method: Heritage manager: Radio broadcast assistant, Grab bars Vision Baseline Vision/History: Wears glasses;Cataracts Wears Glasses: At all times Patient Visual Report: No change from baseline Vision Assessment?: Yes Eye Alignment: Impaired (comment) (right eye with premorbid esotropia) Ocular Range of Motion: Within Functional  Limits Alignment/Gaze Preference: Within Defined Limits Tracking/Visual Pursuits: Able to track stimulus in all quads without difficulty Saccades: Within functional limits Visual Fields: No apparent  deficits Perception  Perception: Within Functional Limits Praxis Praxis: Intact Cognition Overall Cognitive Status: Within Functional Limits for tasks assessed Arousal/Alertness: Awake/alert Attention: Focused;Sustained Focused Attention: Appears intact Sustained Attention: Appears intact Memory: Appears intact Awareness: Appears intact Problem Solving: Appears intact Safety/Judgment: Appears intact Sensation Sensation Light Touch: Appears Intact Hot/Cold: Appears Intact Proprioception: Appears Intact Stereognosis: Appears Intact Coordination Gross Motor Movements are Fluid and Coordinated: No Fine Motor Movements are Fluid and Coordinated: No Coordination and Movement Description: Pt with mild RUE hemiparesis, currently useing at a diminished level for selfcare tasks.  Slight ataxia noted wth finger to nose, but able to use for self feeding and dressing tasks with increased time.  Still with decreased writing capabilities. 9 Hole Peg Test: R=53 L=27 Motor  Motor Motor: Hemiplegia Motor - Discharge Observations: continues with mild R hemiparesis (UE>LE) Mobility  Bed Mobility Bed Mobility: Sit to Supine;Supine to Sit Supine to Sit: Independent with assistive device Sit to Supine: Independent with assistive device Transfers Sit to Stand: Independent with assistive device Stand to Sit: Independent with assistive device  Trunk/Postural Assessment  Cervical Assessment Cervical Assessment: Within Functional Limits Thoracic Assessment Thoracic Assessment: Exceptions to [facility] (rounding posture) Lumbar Assessment Lumbar Assessment: Exceptions to [facility] (posterior pelvic tilt)  Balance Balance Balance Assessed: Yes Static Sitting Balance Static Sitting - Balance Support: Feet  supported Static Sitting - Level of Assistance: 7: Independent Dynamic Sitting Balance Dynamic Sitting - Balance Support: During functional activity Dynamic Sitting - Level of Assistance: 6: Modified independent (Device/Increase time) Static Standing Balance Static Standing - Balance Support: During functional activity;Bilateral upper extremity supported Static Standing - Level of Assistance: 6: Modified independent (Device/Increase time) Dynamic Standing Balance Dynamic Standing - Balance Support: During functional activity Dynamic Standing - Level of Assistance: 4: Min assist Extremity/Trunk Assessment RUE Assessment RUE Assessment: Exceptions to [facility] Passive Range of Motion (PROM) Comments: WFLS for all joints Active Range of Motion (AROM) Comments: Brunnstrum stage VI with isolated UE movement at this time General Strength Comments: She exhibits strength 3+/5 in the shoulder and 4/5 in all othera joints.  Still with increased ataxia with functional reach but able to use at a diminished level for selfcare tasks.  Decreased FM coordination noted as well but able to pick up small objects and translate from finger to palm and back slowly. LUE Assessment LUE Assessment: Within Functional Limits   Emya Picado OTR/L 10/14/2020, 5:01 PM

## 2020-10-14 NOTE — Progress Notes (Signed)
Patient ID: Amber [name], female   DOB: 16-Oct-1970, 50 y.o.   MRN: [id number]  New Patient Visit with Rema Fendt, NP on Thursday December 04, 2020 at 8:50 AM.   Primary Care at [facility] [address] [place] [address] [phone]  Lavera Guise, [place] [phone]

## 2020-10-15 LAB — GLUCOSE, CAPILLARY: Glucose-Capillary: 133 mg/dL — ABNORMAL HIGH (ref 70–99)

## 2020-10-15 MED ORDER — DIPHENHYDRAMINE HCL 25 MG PO CAPS
25.0000 mg | ORAL_CAPSULE | Freq: Four times a day (QID) | ORAL | Status: DC | PRN
  Administered 2020-10-15: 25 mg via ORAL
  Filled 2020-10-15: qty 1

## 2020-10-15 NOTE — Progress Notes (Signed)
Inpatient Rehabilitation Care Coordinator Discharge Note  The overall goal for the admission was met for:   Discharge location: Yes, home  Length of Stay: Yes, 14 Days  Discharge activity level: Yes, MOD I  Home/community participation: Yes  Services provided included: MD, RD, PT, OT, SLP, RN, CM, TR, Pharmacy, Neuropsych, and SW  Financial Services: Private Insurance: [place] offered to/list presented to: patient  Follow-up services arranged: Outpatient: [facility]  Comments (or additional information): PT OT Rolling Walker, Bedside Commode  Patient/Family verbalized understanding of follow-up arrangements: Yes  Individual responsible for coordination of the follow-up plan: patient, [phone]  Confirmed correct DME delivered: Dyanne Iha 10/15/2020    Dyanne Iha

## 2020-10-16 ENCOUNTER — Telehealth: Payer: Self-pay

## 2020-10-16 NOTE — Telephone Encounter (Signed)
Transition Care Management Unsuccessful Follow-up Telephone Call  Date of discharge and from where: [facility] on 10/15/2020  Attempts:  1st Attempt  Reason for unsuccessful TCM follow-up call:  Voice mail full unable to leave message on home nr . Called [phone] message stated nr. not in service.

## 2020-10-17 ENCOUNTER — Telehealth: Payer: Self-pay

## 2020-10-17 NOTE — Telephone Encounter (Signed)
Transition Care Management Follow-up Telephone Call   Date of discharge and from where:[facility] on 10/16/2020 How have you been since you were released from the hospital? ok Any questions or concerns? No questions/concerns reported.  Items Reviewed: Did the pt receive and understand the discharge instructions provided? have the instructions and have no questions.  Medications obtained and verified? She said she have the' medication list  and the hospital staff reviewed them in detail prior to discharge. She said that he has all of the medications and they have no questions.  Any new allergies since your discharge? None reported  Do you have support at home? Yes, daughter Other (ie: DME, Home Health, etc)     PT /OT Rolling Walker, Bedside Commode   Functional Questionnaire: (I = Independent and D = Dependent) ADL's:  Independent.        Follow up appointments reviewed:   PCP Hospital f/u appt confirmed? PA Cari Mayer's on 10/20/2020  Specialist Hospital f/u appt confirmed?  scheduled at this time  Are transportation arrangements needed? have transportation   If their condition worsens, is the pt aware to call  their PCP or go to the ED? Yes.Made pt aware if condition worsen or start experiencing rapid weight gain, chest pain, diff breathing, SOB, high fevers, or bleading to refer imediately to ED for further evaluation.  Was the patient provided with contact information for the PCP's office or ED? Pt have the phone number  Was the pt encouraged to call back with questions or concerns?yes

## 2020-10-20 ENCOUNTER — Ambulatory Visit: Payer: BC Managed Care – PPO | Admitting: Physician Assistant

## 2020-10-21 ENCOUNTER — Ambulatory Visit (INDEPENDENT_AMBULATORY_CARE_PROVIDER_SITE_OTHER): Payer: BC Managed Care – PPO | Admitting: Physician Assistant

## 2020-10-21 ENCOUNTER — Other Ambulatory Visit: Payer: Self-pay

## 2020-10-21 ENCOUNTER — Encounter: Payer: Self-pay | Admitting: Physician Assistant

## 2020-10-21 VITALS — BP 119/79 | HR 79 | Temp 98.2°F | Resp 18 | Ht 67.0 in | Wt 260.0 lb

## 2020-10-21 DIAGNOSIS — E1159 Type 2 diabetes mellitus with other circulatory complications: Secondary | ICD-10-CM | POA: Diagnosis not present

## 2020-10-21 DIAGNOSIS — Z8673 Personal history of transient ischemic attack (TIA), and cerebral infarction without residual deficits: Secondary | ICD-10-CM

## 2020-10-21 DIAGNOSIS — B372 Candidiasis of skin and nail: Secondary | ICD-10-CM | POA: Diagnosis not present

## 2020-10-21 MED ORDER — LANTUS SOLOSTAR 100 UNIT/ML ~~LOC~~ SOPN: 71.0000 [IU] | PEN_INJECTOR | Freq: Every day | SUBCUTANEOUS | 11 refills | Status: DC

## 2020-10-21 MED ORDER — NYSTATIN-TRIAMCINOLONE 100000-0.1 UNIT/GM-% EX CREA: 1.0000 "application " | TOPICAL_CREAM | Freq: Two times a day (BID) | CUTANEOUS | 0 refills | Status: DC

## 2020-10-21 NOTE — Progress Notes (Signed)
New Patient Office Visit  Subjective:  Patient ID: Amber [name], female    DOB: 06/07/1970  Age: 50 y.o. MRN: [id number]  CC:  Chief Complaint  Patient presents with   Hospitalization Follow-up    CVA/DM    HPI Amber [name] states that she was hospitalized from July 13 through October 15, 2020.  Hospital course  Discharge Diagnoses:  Principal Problem:   Left pontine cerebrovascular accident (HCC) Permissive hypertension Mood stabilization Diabetes mellitus Thrombophlebitis Hyperlipidemia Sleep apnea research trial Obesity     Brief HPI:   Amber [name] is a 50 y.o. right-handed female with history of hypertension hyperlipidemia type 2 diabetes mellitus.  Per chart review she lives in an apartment in her mother's basement.  Independent prior to admission.  She has 14 steps down to the living room.  She works full-time as a Engineer, site.  Presented 09/24/2020 after being found down for an extended amount of time with right side weakness as well as facial droop.  Cranial CT scan showed abnormal hypoattenuation within the left pons and left thalamus concerning for age-indeterminate infarction.  CT angiogram head and neck no emergent large vessel occlusion.  Patient did not receive tPA.  MRI showed a 10 mm acute infarction in the left pons.  No hemorrhage or mass-effect.  Echocardiogram with ejection fraction of 60 to 65% grade 2 diastolic dysfunction.  No regional wall motion abnormalities.  Admission chemistries unremarkable except sodium 133 glucose 310 CK 1112 urinalysis positive nitrite.  Maintained on aspirin as well as Plavix for CVA prophylaxis x3 months then aspirin alone.  Hospital course patient did develop some right antecubital redness and swelling suspect thrombophlebitis placed on 7-day course of Keflex.  Tolerating a regular consistency diet.  Therapy evaluations completed due to patient's right side weakness decreased functional mobility was admitted for a comprehensive  rehab program.     Hospital Course: Amber [name] was admitted to rehab 10/01/2020 for inpatient therapies to consist of PT, ST and OT at least three hours five days a week. Past admission physiatrist, therapy team and rehab RN have worked together to provide customized collaborative inpatient rehab.  Pertaining to patient's left pontine infarction likely from symptomatic moderate distal basilar stenosis remained stable she will continue aspirin and Plavix x3 months then aspirin alone with follow-up per neurology services.  Pain managed use of Flexeril as needed.  Mood stabilization with Zoloft she was attending full therapies.  Permissive hypertension and his Zestoretic was resumed.  Patient would need outpatient follow-up with PCP..  Diabetes mellitus hemoglobin A1c 11.7 insulin therapy as directed full diabetic teaching completed.  She did complete a course of Keflex for thrombophlebitis resolved.  Lipitor ongoing for hyperlipidemia.  She had been placed in a sleep apnea research trial.  Noted obesity BMI 37.90 dietary follow-up.     Blood pressures were monitored on TID basis and controlled   Diabetes has been monitored with ac/hs CBG checks and SSI was use prn for tighter BS control.     Rehab course: During patient's stay in rehab weekly team conferences were held to monitor patient's progress, set goals and discuss barriers to discharge. At admission, patient required minimal assist sit to stand minimal assist supine to sit minimal guard 45 feet rolling walker minimal assist upper body bathing moderate assist lower body bathing minimal assist upper body dressing moderate assist lower body dressing   States today that she has not had the Lantus since she was discharged from the hospital.  States  that she was told by the pharmacy that it required a prior authorization.  Reports that she has in the past taken [facility], states that it did offer her some weight loss.  Reports that she was being  treated for a rash while she was in the hospital, states that she has not been able to use the powder they gave her, states that it was just a small amount.  Describes the rash as itchy, has been trying to keep area clean and dry but states it is difficult.  Reports that she will be having her primary care provider at [facility] family practice with Dr. Manson Passey.  Reports that she has not made an appointment yet to establish.  States that she has been continuing with physical therapy and Occupational Therapy.  No other concerns at this time.     Past Medical History:  Diagnosis Date   Depression    DM2 (diabetes mellitus, type 2) (HCC)    HLD (hyperlipidemia)    HTN (hypertension)     History reviewed. No pertinent surgical history.  History reviewed. No pertinent family history.  Social History   Socioeconomic History   Marital status: Divorced    Spouse name: Not on file   Number of children: Not on file   Years of education: Not on file   Highest education level: Not on file  Occupational History   Not on file  Tobacco Use   Smoking status: Never   Smokeless tobacco: Never  Substance and Sexual Activity   Alcohol use: Not Currently   Drug use: Never   Sexual activity: Not Currently  Other Topics Concern   Not on file  Social History Narrative   ** Merged History Encounter **       Social Determinants of Health   Financial Resource Strain: Not on file  Food Insecurity: Not on file  Transportation Needs: Not on file  Physical Activity: Not on file  Stress: Not on file  Social Connections: Not on file  Intimate Partner Violence: Not on file    ROS Review of Systems  Constitutional: Negative.   HENT: Negative.    Eyes: Negative.   Respiratory:  Negative for shortness of breath.   Cardiovascular:  Negative for chest pain.  Gastrointestinal: Negative.   Endocrine: Negative.   Genitourinary: Negative.   Musculoskeletal: Negative.   Skin:  Positive for rash.   Allergic/Immunologic: Negative.   Neurological: Negative.   Hematological: Negative.   Psychiatric/Behavioral: Negative.     Objective:   Today's Vitals: BP 119/79 (BP Location: Right Arm, Patient Position: Sitting, Cuff Size: Normal)   Pulse 79   Temp 98.2 F (36.8 C) (Oral)   Resp 18   Ht 5\' 7"  (1.702 m)   Wt 260 lb (117.9 kg)   LMP 10/08/2020 (Approximate)   SpO2 98%   BMI 40.72 kg/m   Physical Exam Constitutional:      Comments: Using walker    Assessment & Plan:   Problem List Items Addressed This Visit       Endocrine   DM2 (diabetes mellitus, type 2) (HCC) - Primary   Relevant Medications   insulin glargine (LANTUS SOLOSTAR) 100 UNIT/ML Solostar Pen   Other Visit Diagnoses     Yeast infection of the skin       Relevant Medications   nystatin-triamcinolone (MYCOLOG II) cream   History of stroke           Outpatient Encounter Medications as of 10/21/2020  Medication  Sig   acetaminophen (TYLENOL) 325 MG tablet Take 2 tablets (650 mg total) by mouth every 6 (six) hours as needed for mild pain (or Fever >/= 101).   aspirin EC 81 MG EC tablet Take 1 tablet (81 mg total) by mouth daily. Swallow whole.   atorvastatin (LIPITOR) 40 MG tablet Take 1 tablet (40 mg total) by mouth at bedtime.   clopidogrel (PLAVIX) 75 MG tablet Take 1 tablet (75 mg total) by mouth daily.   cyclobenzaprine (FLEXERIL) 5 MG tablet Take 1 tablet (5 mg total) by mouth 3 (three) times daily as needed for muscle spasms.   glipiZIDE (GLUCOTROL) 5 MG tablet Take 1 tablet (5 mg total) by mouth daily before breakfast.   insulin aspart (NOVOLOG FLEXPEN) 100 UNIT/ML FlexPen Inject 10 Units into the skin 3 (three) times daily with meals.   nystatin-triamcinolone (MYCOLOG II) cream Apply 1 application topically 2 (two) times daily.   insulin glargine (LANTUS SOLOSTAR) 100 UNIT/ML Solostar Pen Inject 71 Units into the skin daily.   lisinopril-hydrochlorothiazide (ZESTORETIC) 20-12.5 MG tablet Take 1  tablet by mouth daily.   miconazole nitrate (MICATIN) POWD Apply 1 application topically 2 (two) times daily.   sertraline (ZOLOFT) 100 MG tablet Take 1 tablet (100 mg total) by mouth daily.   [DISCONTINUED] insulin glargine (LANTUS SOLOSTAR) 100 UNIT/ML Solostar Pen Inject 71 Units into the skin daily. (Patient not taking: Reported on 10/21/2020)   No facility-administered encounter medications on file as of 10/21/2020.   1. Type 2 diabetes mellitus with other circulatory complication, without long-term current use of insulin (HCC)  Resent Lantus to patient's pharmacy to be able to help patient with prior authorization if needed.  Patient encouraged to check blood glucose levels at home, keep a written log and have available for all office visits.  Patient is going to establish at [facility] family practice.  Patient encouraged to follow-up as needed. - insulin glargine (LANTUS SOLOSTAR) 100 UNIT/ML Solostar Pen; Inject 71 Units into the skin daily.  Dispense: 15 mL; Refill: 11  2. Yeast infection of the skin Trial Mycolog, patient education given on supportive care. - nystatin-triamcinolone (MYCOLOG II) cream; Apply 1 application topically 2 (two) times daily.  Dispense: 240 g; Refill: 0  3. History of stroke Red flags given for prompt reevaluation.   I have reviewed the patient's medical history (PMH, PSH, Social History, Family History, Medications, and allergies) , and have been updated if relevant. I spent 31 minutes reviewing chart and  face to face time with patient.    Follow-up: Return if symptoms worsen or fail to improve.   Kasandra Knudsen Mayers, PA-C

## 2020-10-21 NOTE — Progress Notes (Signed)
Patient has taken medication today. Patient has eaten today. Patient denies pain at this time. Patient needs a PA completed by PCP for Lantus or is willing to switch back to [facility].

## 2020-10-21 NOTE — Patient Instructions (Signed)
You will use Mycolog on your rash twice a day, I encourage you to keep the area clean and dry.  I sent the Lantus to your pharmacy, we will follow-up with prior authorization if needed.  Please call our office if you have not received this medication in the next couple of days.  Roney Jaffe, PA-C Physician Assistant [facility] [email or web address]  Skin Yeast Infection  A skin yeast infection is a condition in which there is an overgrowth of yeast (candida) that normally lives on the skin. This condition usually occurs in areas ofthe skin that are constantly warm and moist, such as the armpits or the groin. What are the causes? This condition is caused by a change in the normal balance of the yeast andbacteria that live on the skin. What increases the risk? You are more likely to develop this condition if you: Are obese. Are pregnant. Take birth control pills. Have diabetes. Take antibiotic medicines. Take steroid medicines. Are malnourished. Have a weak body defense system (immune system). Are 21 years of age or older. Wear tight clothing. What are the signs or symptoms? The most common symptom of this condition is itchiness in the affected area. Other symptoms include: Red, swollen area of the skin. Bumps on the skin. How is this diagnosed? This condition is diagnosed with a medical history and physical exam. Your health care provider may check for yeast by taking light scrapings of the skin to be viewed under a microscope. How is this treated? This condition is treated with medicine. Medicines may be prescribed or be available over the counter. The medicines may be: Taken by mouth (orally). Applied as a cream or powder to your skin. Follow these instructions at home:  Take or apply over-the-counter and prescription medicines only as told by your health care provider. Maintain a healthy weight. If you need help losing  weight, talk with your health care provider. Keep your skin clean and dry. If you have diabetes, keep your blood sugar under control. Keep all follow-up visits as told by your health care provider. This is important. Contact a health care provider if: Your symptoms go away and then return. Your symptoms do not get better with treatment. Your symptoms get worse. Your rash spreads. You have a fever or chills. You have new symptoms. You have new warmth or redness of your skin. Summary A skin yeast infection is a condition in which there is an overgrowth of yeast (candida) that normally lives on the skin. This condition is caused by a change in the normal balance of the yeast and bacteria that live on the skin. Take or apply over-the-counter and prescription medicines only as told by your health care provider. Keep your skin clean and dry. Contact a health care provider if your symptoms do not get better with treatment. This information is not intended to replace advice given to you by your health care provider. Make sure you discuss any questions you have with your healthcare provider. Document Revised: 07/26/2017 Document Reviewed: 07/26/2017 Elsevier Patient Education  2022 [employer].

## 2020-10-22 ENCOUNTER — Encounter: Payer: Self-pay | Admitting: Occupational Therapy

## 2020-10-22 ENCOUNTER — Ambulatory Visit: Payer: BC Managed Care – PPO | Attending: Physician Assistant

## 2020-10-22 ENCOUNTER — Ambulatory Visit: Payer: BC Managed Care – PPO | Admitting: Occupational Therapy

## 2020-10-22 DIAGNOSIS — R270 Ataxia, unspecified: Secondary | ICD-10-CM | POA: Insufficient documentation

## 2020-10-22 DIAGNOSIS — M79601 Pain in right arm: Secondary | ICD-10-CM

## 2020-10-22 DIAGNOSIS — R278 Other lack of coordination: Secondary | ICD-10-CM | POA: Diagnosis present

## 2020-10-22 DIAGNOSIS — R262 Difficulty in walking, not elsewhere classified: Secondary | ICD-10-CM | POA: Insufficient documentation

## 2020-10-22 DIAGNOSIS — I69351 Hemiplegia and hemiparesis following cerebral infarction affecting right dominant side: Secondary | ICD-10-CM | POA: Insufficient documentation

## 2020-10-22 DIAGNOSIS — R2689 Other abnormalities of gait and mobility: Secondary | ICD-10-CM

## 2020-10-22 DIAGNOSIS — M6281 Muscle weakness (generalized): Secondary | ICD-10-CM | POA: Diagnosis present

## 2020-10-22 DIAGNOSIS — R2681 Unsteadiness on feet: Secondary | ICD-10-CM

## 2020-10-22 NOTE — Therapy (Signed)
[facility] [address] [place], [place], [address] Phone: [phone]   Fax:  [phone]  Occupational Therapy Evaluation  Patient Details  Name: Amber [name] MRN: [id number] Date of Birth: Sep 24, 1970 Referring Provider (OT): Harvel Ricks   Encounter Date: 10/22/2020   OT End of Session - 10/22/20 1639     Visit Number 1    Number of Visits 17    Date for OT Re-Evaluation 01/05/21    Authorization Type BCBS 2022 - [id number]    OT Start Time 1530    OT Stop Time 1615    OT Time Calculation (min) 45 min    Activity Tolerance Patient tolerated treatment well    Behavior During Therapy [facility] for tasks assessed/performed             Past Medical History:  Diagnosis Date   Depression    DM2 (diabetes mellitus, type 2) (HCC)    HLD (hyperlipidemia)    HTN (hypertension)     History reviewed. No pertinent surgical history.  There were no vitals filed for this visit.   Subjective Assessment - 10/22/20 1537     Subjective  Patient hoping to return to full time work    Patient is accompanied by: Family member   Fiance   Currently in Pain? No/denies    Pain Score 0-No pain               [facility] OT Assessment - 10/22/20 1538       Assessment   Medical Diagnosis Left Pons CVA    Referring Provider (OT) Jesusita Oka Anguilli    Onset Date/Surgical Date 09/24/20    Hand Dominance Right    Prior Therapy Inpatient Rehab 7/13-7/27/22      Precautions   Precautions Fall      Restrictions   Weight Bearing Restrictions No      Prior Function   Level of Independence Independent with basic ADLs    Vocation Full time employment    Vocation Requirements Middle School Teacher      ADL   Eating/Feeding Modified independent    Grooming Minimal assistance   Ataxic movement   Upper Body Bathing Minimal assistance    Lower Body Bathing Minimal assistance    Upper Body Dressing Increased time   not currently wearing bra  due to chaffing   Lower Body Dressing Minimal assistance    Toilet Transfer Modified independent    Toileting - Clothing Manipulation Modified independent    Toileting -  Pharmacologist      IADL   Prior Level of Teacher, early years/pre for transportation    Prior Level of Function Light Housekeeping Independent    Light Housekeeping Performs light daily tasks but cannot maintain acceptable level of cleanliness    Prior Level of Function Meal Prep Independent    Meal Prep Able to complete simple warm meal prep    Prior Level of Function Community Mobility independent    Community Mobility Relies on family or friends for transportation    Prior Level of Function Medication Managment independent    Medication Management Is responsible for taking medication in correct dosages at correct time      Written Expression   Dominant Hand Right    Handwriting 90% legible;Increased time  Vision - History   Baseline Vision Wears glasses all the time    Visual History Retinopathy    Additional Comments Cataracts, diabetic retinopathy - right eye with more lateral orientation (not stroke related)      Vision Assessment   Eye Alignment Impaired (comment)    Comment R eye pulls laterally -  long term condition      Activity Tolerance   Activity Tolerance Comments Patient reports fatigue impacting daily activities      Cognition   Overall Cognitive Status Impaired/Different from baseline   Mentally fatigues and recognizes limits   Cognition Comments Cognition appears functional, but fatigue is evident per patient.  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, site      Observation/Other Assessments   Focus on Therapeutic Outcomes (FOTO)  Stroke UE FS Primary-  64 / risk adjusted  52      Posture/Postural Control   Posture/Postural Control Postural limitations    Postural Limitations Posterior pelvic tilt;Rounded Shoulders    Posture Comments decreased weight acceptance on RLE in standing, flexed posture in sitting - able to self correct      Sensation   Light Touch Appears Intact    Stereognosis Appears Intact    Hot/Cold Appears Intact    Proprioception Appears Intact      Coordination   Gross Motor Movements are Fluid and Coordinated No    Fine Motor Movements are Fluid and Coordinated No    Finger Nose Finger Test significant dysmetria - undershooting    9 Hole Peg Test Right;Left    Right 9 Hole Peg Test 58.72    Left 9 Hole Peg Test 22.13      Perception   Perception Impaired      Praxis   Praxis Intact      Tone   Assessment Location Right Upper Extremity      ROM / Strength   AROM / PROM / Strength AROM;Strength      AROM   Overall AROM  Within functional limits for tasks performed    Overall AROM Comments ataxic, proximal weakness      Strength   Overall Strength Deficits    Overall Strength Comments 3+/5 proximally RUE - Shoulder      Hand Function   Right Hand Gross Grasp Impaired    Right Hand Grip (lbs) 35.7    Right Hand Lateral Pinch 6 lbs    Right Hand 3 Point Pinch 6 lbs    Left Hand Gross Grasp Functional    Left Hand Grip (lbs) 61.5    Left Hand Lateral Pinch 6 lbs    Left 3 point pinch 6 lbs      RUE Tone   RUE Tone Mild;Hypotonic                             OT Education - 10/22/20 1638     Education Details Results of evaluation and potential goals / plan of care    Person(s) Educated Patient;Spouse   fiance Amber [name]   Methods Explanation    Comprehension Verbalized understanding              OT Short Term Goals - 10/22/20 1651       OT SHORT TERM GOAL #1   Title Patient will complete HEP designed to improve RUE coordination    Time 4    Period Weeks  Status New    Target Date  12/06/20      OT SHORT TERM GOAL #2   Title Patient will complete an HEP designed to improve right grip strength    Time 4    Period Weeks    Status New      OT SHORT TERM GOAL #3   Title Patient will demonstrate improved ability to maintain arm over head sufficient time to wet / rinse hair with hand held shower head    Time 4    Period Weeks    Status New      OT SHORT TERM GOAL #4   Title Patient will self report improved control of vertical motion when brushing teeth    Time 4    Period Weeks    Status New      OT SHORT TERM GOAL #5   Title Patient will demonstrate understanding of return to work recommendations    Time 4    Period Weeks    Status New      Additional Short Term Goals   Additional Short Term Goals Yes      OT SHORT TERM GOAL #6   Title Patient will complete bathing, dressing, and grooming with modified independence consistently in a timely manner in preparation for return to work.    Time 4    Period Weeks    Status New               OT Long Term Goals - 10/22/20 1656       OT LONG TERM GOAL #1   Title Patient will complete updated HEP for RUE strength and coordination    Time 8    Period Weeks    Status New      OT LONG TERM GOAL #2   Title Patient will demonstrate at least a10 lb increase in right grip strength to ease opening packages, carrying items, etc.    Baseline 35lb    Time 8    Period Weeks    Status New      OT LONG TERM GOAL #3   Title Patient will complete 9 hole peg test in 45 sec or less    Baseline 58 sec    Time 8    Period Weeks    Status New      OT LONG TERM GOAL #4   Title Patient will demonstrate improved ability to load/unload dishwasher, place dishes in shoulder height cabinets    Time 8    Period Weeks    Status New      OT LONG TERM GOAL #5   Title Patient will demonstrate understanding of return to driving recommendations    Time 8    Period Weeks    Status New      Long Term Additional Goals    Additional Long Term Goals --      OT LONG TERM GOAL #6   Title With her right hand, patient will carry a hot beverage without spilling while walking 15 feet level surface    Time 8    Period Weeks    Status New      OT LONG TERM GOAL #7   Title Patient will complete FOTO survey at discharge with d/c score of 75    Baseline FS 64, Risk adjusted 52    Time 8    Period Weeks    Status New  Plan - 10/22/20 1643     Clinical Impression Statement Patient is a 50 yr old woman recently discharged from Inpatient Rehab s/p L pons infarct on 09/24/20.  Patient now living with fiance as her mother has had recent strokes and is currently in IP Rehab.  (Patient lived with mother prior)  Patient presents to OT evaluation with supportive partner.  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  Patient displays right dominant hemiparesis, ataxia, decreased activity tolerance, coordination both gross and fine and decreased balance - all of which impede independent performance of ADL/IADL.  Patient will benefit from skilled OT intervention to help her retunr to prior level of functioning.    OT Occupational Profile and History Detailed Assessment- Review of Records and additional review of physical, cognitive, psychosocial history related to current functional performance    Occupational performance deficits (Please refer to evaluation for details): ADL's;IADL's;Work;Rest and Sleep;Leisure    Body Structure / Function / Physical Skills ADL;Strength;Balance;Dexterity;GMC;Pain;Tone;Body mechanics;UE functional use;Endurance;IADL;Coordination;Mobility;FMC    Rehab Potential Excellent    Clinical Decision Making Several treatment options, min-mod task modification necessary    Comorbidities Affecting Occupational Performance: May have comorbidities impacting occupational performance     Modification or Assistance to Complete Evaluation  No modification of tasks or assist necessary to complete eval    OT Frequency 2x / week    OT Duration 8 weeks    OT Treatment/Interventions Self-care/ADL training;DME and/or AE instruction;Splinting;Balance training;Aquatic Therapy;Therapeutic activities;Cognitive remediation/compensation;Therapeutic exercise;Neuromuscular education;Functional Mobility Training;Visual/perceptual remediation/compensation;Patient/family education;Manual Therapy;Electrical Stimulation    Plan Needs to start an HEP to develop proximal strengthening.  Also coordiantion exercises which include typing / handwriting    Consulted and Agree with Plan of Care Patient;Family member/caregiver    Family Member Consulted fiance Amber [name]             Patient will benefit from skilled therapeutic intervention in order to improve the following deficits and impairments:   Body Structure / Function / Physical Skills: ADL, Strength, Balance, Dexterity, GMC, Pain, Tone, Body mechanics, UE functional use, Endurance, IADL, Coordination, Mobility, FMC       Visit Diagnosis: Hemiplegia and hemiparesis following cerebral infarction affecting right dominant side (HCC) - Plan: Ot plan of care cert/re-cert  Ataxia - Plan: Ot plan of care cert/re-cert  Muscle weakness (generalized) - Plan: Ot plan of care cert/re-cert  Pain in right arm - Plan: Ot plan of care cert/re-cert  Other lack of coordination - Plan: Ot plan of care cert/re-cert    Problem List Patient Active Problem List   Diagnosis Date Noted   Left pontine cerebrovascular accident (HCC) 10/01/2020   Hyperglycemia 09/25/2020   Stroke (HCC) 09/25/2020   HTN (hypertension)    DM2 (diabetes mellitus, type 2) (HCC)    HLD (hyperlipidemia)    Depression    Acute ischemic stroke (HCC) 09/24/2020    Collier SalinaGellert, Spenser Harren M, OTR/L 10/22/2020, 5:06 PM  [facility] [address] [place], [place], [address] Phone: [phone]   Fax:  [phone]  Name: Algis GreenhouseSabrina Bhola MRN: [id number] Date of Birth: 08/17/1970

## 2020-10-22 NOTE — Therapy (Signed)
[facility] [address] [place], [place], [address] Phone: [phone]   Fax:  [phone]  Physical Therapy Evaluation  Patient Details  Name: Amber GreenhouseSabrina Smoot MRN: [id number] Date of Birth: 03/19/1971 Referring Provider (PT): Mariam Dollaraniel Angiulli, PA-C   Encounter Date: 10/22/2020   PT End of Session - 10/22/20 2041     Visit Number 1    Number of Visits 17    Date for PT Re-Evaluation 12/18/20    Authorization Type BCBS    PT Start Time 1445    PT Stop Time 1530    PT Time Calculation (min) 45 min    Equipment Utilized During Treatment Gait belt    Activity Tolerance Patient tolerated treatment well    Behavior During Therapy WFL for tasks assessed/performed             Past Medical History:  Diagnosis Date   Depression    DM2 (diabetes mellitus, type 2) (HCC)    HLD (hyperlipidemia)    HTN (hypertension)     History reviewed. No pertinent surgical history.  There were no vitals filed for this visit.    Subjective Assessment - 10/22/20 1451     Subjective Patient prsetnted on 09/24/20 with right side weakness and facial droop. Patient recieved inpatient rehab services and was discharged home on 7/27. Patient reports things have been going well since being home. Patient reports she is dealing with some fatigue getting in/out of bed, taking her more time. Reports balance is slightly off in the morning compared to later in the evening. patient ambulating into session with RW, but reports not utilziing it in the house. Denies pain, residual right sided weakness. No falls.    Patient is accompained by: Family member   Fiance   Pertinent History HLD, HTN, DM2, Depression    Limitations Standing;Walking;House hold activities    How long can you walk comfortably? < 5 minutes    Diagnostic tests MRI showed a 10 mm acute infarction in the left pons.  No hemorrhage or mass-effect. Echocardiogram with ejection fraction of  60 to 65% grade 2 diastolic dysfunction    Patient Stated Goals Wants to be Independence, Get Rid of the RW    Currently in Pain? No/denies                [facility] PT Assessment - 10/22/20 1452       Assessment   Medical Diagnosis Left Pons CVA    Referring Provider (PT) Mariam Dollaraniel Angiulli, PA-C    Onset Date/Surgical Date 09/24/20    Hand Dominance Right    Prior Therapy Inpatient Rehab      Precautions   Precautions Fall      Restrictions   Weight Bearing Restrictions No      Balance Screen   Has the patient fallen in the past 6 months Yes    How many times? 1    Has the patient had a decrease in activity level because of a fear of falling?  No    Is the patient reluctant to leave their home because of a fear of falling?  No      Home Nurse, mental healthnvironment   Living Environment Private residence    Living Arrangements Spouse/significant other    Available Help at Discharge Family    Type of Home House    Home Access Stairs to enter    Entrance Stairs-Number of Steps 3    Entrance Stairs-Rails Right;Left;Can reach both  Home Layout One level   laundry room is downstairs; does not need to do this.   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      Cognition   Overall Cognitive Status Within Functional Limits for tasks assessed      Observation/Other Assessments   Focus on Therapeutic Outcomes (FOTO)  Stroke LE: 55%      Sensation   Light Touch Appears Intact    Proprioception Appears Intact      Coordination   Gross Motor Movements are Fluid and Coordinated No    Coordination and Movement Description general mild coordination deficits with RLE due to weakness      Posture/Postural Control   Posture/Postural Control Postural limitations    Postural Limitations Rounded Shoulders;Forward head       ROM / Strength   AROM / PROM / Strength Strength      Strength   Overall Strength Deficits    Strength Assessment Site Hip;Knee;Ankle    Right/Left Hip Right;Left    Right Hip Flexion 4-/5    Right Hip ABduction 3+/5    Right Hip ADduction 4-/5    Left Hip Flexion 4+/5    Left Hip ABduction 4+/5    Left Hip ADduction 4+/5    Right/Left Knee Right;Left    Right Knee Flexion 4-/5    Right Knee Extension 4/5    Left Knee Flexion 5/5    Left Knee Extension 5/5    Right/Left Ankle Right;Left    Right Ankle Dorsiflexion 4/5    Left Ankle Dorsiflexion 5/5      Bed Mobility   Bed Mobility Sit to Supine;Supine to Sit      Transfers   Transfers Sit to Stand;Stand to Sit    Sit to Stand 5: Supervision    Five time sit to stand comments  13.65 secs without UE support from standard chari    Stand to Sit 5: Supervision      Ambulation/Gait   Ambulation/Gait Yes    Ambulation/Gait Assistance 5: Supervision;4: Min guard    Ambulation/Gait Assistance Details completed ambulation x 75 ft with RW at supervision level, PT educaitng on improved safety with turns as patietn tend to pick RW up and place vs keeping in contact w/ ground. Completed ambulation x 50 without AD and CGA from PT, mild imbalance noted.    Assistive device Rolling walker;None    Gait Pattern Step-through pattern;Decreased arm swing - right;Decreased step length - left;Decreased step length - right;Decreased stance time - right;Decreased hip/knee flexion - right;Decreased dorsiflexion - right;Decreased weight shift to right    Ambulation Surface Level;Indoor    Gait velocity 15.71 secs = 2.1 ft/sec    Gait Comments PT provided tennis balls for RW for eased propulsion      Standardized Balance Assessment   Standardized Balance Assessment Berg Balance Test;Timed Up and Go Test      Berg Balance Test   Sit to Stand Able to stand without using hands and stabilize independently    Standing Unsupported Able to stand  safely 2 minutes    Sitting with Back Unsupported but Feet Supported on Floor or Stool Able to sit safely and securely 2 minutes    Stand to Sit Sits safely with minimal use of hands  Transfers Able to transfer safely, minor use of hands    Standing Unsupported with Eyes Closed Able to stand 10 seconds safely    Standing Unsupported with Feet Together Able to place feet together independently and stand for 1 minute with supervision    From Standing, Reach Forward with Outstretched Arm Can reach forward >12 cm safely (5")    From Standing Position, Pick up Object from Floor Able to pick up shoe, needs supervision    From Standing Position, Turn to Look Behind Over each Shoulder Looks behind one side only/other side shows less weight shift    Turn 360 Degrees Able to turn 360 degrees safely one side only in 4 seconds or less    Standing Unsupported, Alternately Place Feet on Step/Stool Able to complete >2 steps/needs minimal assist    Standing Unsupported, One Foot in Front Able to plae foot ahead of the other independently and hold 30 seconds    Standing on One Leg Tries to lift leg/unable to hold 3 seconds but remains standing independently    Total Score 44    Berg comment: 44/56      Timed Up and Go Test   TUG Normal TUG    Normal TUG (seconds) 13.13    TUG Comments mild imbalance noted with turn, close supervision                        Objective measurements completed on examination: See above findings.               PT Education - 10/22/20 1450     Education Details Educated on [employer]) Educated Patient    Methods Explanation    Comprehension Verbalized understanding              PT Short Term Goals - 10/22/20 2057       PT SHORT TERM GOAL #1   Title Patient will be independent with initial HEP for balance/strength (ALL STGs Due: 11/21/2020)    Baseline no HEP established    Time 4    Period Weeks    Status New     Target Date 11/21/20      PT SHORT TERM GOAL #2   Title Patient will improve TUG to </= 12 seconds with LRAD to demo reduced fall risk    Baseline 13.13 secs w/ RW    Time 4    Period Weeks    Status New      PT SHORT TERM GOAL #3   Title Patient will undergo 2MWT/6MWT and LTG to be set    Baseline TBA    Time 4    Period Weeks    Status New      PT SHORT TERM GOAL #4   Title Patient will improve Berg Balance to >/= 47/56 to demo reduced fall risk    Baseline 44/56    Time 4    Period Weeks    Status New               PT Long Term Goals - 10/22/20 2058       PT LONG TERM GOAL #1   Title Patient will be independent with final HEP for balance/strength and daily walking program (ALL LTGs Due: 12/19/2020)    Baseline no HEP established    Time 8    Period Weeks    Status New    Target Date 12/19/20  PT LONG TERM GOAL #2   Title Patient will improve 5x sit <> stand to </= 10 seconds for improved mobiltiy and reduced fall risk    Baseline 13.65 seconds    Time 8    Period Weeks    Status New      PT LONG TERM GOAL #3   Title Patient will improve gait speed w/ LRAD to 2.5 ft/sec to demo improved community mobility    Baseline 2.1 ft/sec    Time 8    Period Weeks    Status New      PT LONG TERM GOAL #4   Title Patient will improve Berg Balance to >/= 50/56 to demo reduced fall risk and improved balance    Baseline 44/56    Time 8    Period Weeks    Status New      PT LONG TERM GOAL #5   Title LTG to be set for 2MWT/6MWT    Baseline TBA    Time 8    Period Weeks    Status New      Additional Long Term Goals   Additional Long Term Goals Yes      PT LONG TERM GOAL #6   Title Patient will improve FOTO to >/= 65%    Baseline 55%    Time 8    Period Weeks    Status New                    Plan - 10/22/20 2049     Clinical Impression Statement Patient is a 50 y.o. female referred to Neuro OPPT services for L Pons CVA. Patient recieved  inpatient rehab services and recently d/c home. Patient's PMH significant for the following: HLD, HTN, DM2, Depression. Upon evaluation patient presents with the following impairments: decreased strength, impaired coordination, abnormal gait, decreased balance, abnormal posture and increased risk for falls. Patient is currently ambulating at 2.1 ft/sec with use of RW. Patient deemed at increased risk for falls with TUG of 13.13 seconds, 5x sit <> stand of 13.65 seconds. Patietn also scored 44/56 on Berg Balance indicating increased fall risk. Patient will benefit from skilled PT services to address impairments and maximize functional mobility    Personal Factors and Comorbidities Comorbidity 3+    Comorbidities HLD, HTN, DM2, Depression    Examination-Activity Limitations Bed Mobility;Sit;Stairs;Transfers;Locomotion Level;Stand    Examination-Participation Restrictions Occupation;Community Activity;Driving    Stability/Clinical Decision Making Stable/Uncomplicated    Clinical Decision Making Low    Rehab Potential Good    PT Frequency 2x / week    PT Duration 8 weeks    PT Treatment/Interventions ADLs/Self Care Home Management;Aquatic Therapy;Cryotherapy;Electrical Stimulation;Moist Heat;DME Instruction;Balance training;Therapeutic exercise;Therapeutic activities;Functional mobility training;Stair training;Gait training;Neuromuscular re-education;Patient/family education;Orthotic Fit/Training;Vestibular;Passive range of motion;Manual techniques    PT Next Visit Plan Assess or dependent upon patient's tolerance. Initiate HEP focused on RLE strengthening and balance.    Consulted and Agree with Plan of Care Patient;Family member/caregiver    Family Member Consulted Fiance             Patient will benefit from skilled therapeutic intervention in order to improve the following deficits and impairments:  Abnormal gait, Postural dysfunction, Decreased coordination, Decreased activity  tolerance, Decreased endurance, Decreased strength, Difficulty walking, Decreased balance, Decreased knowledge of use of DME  Visit Diagnosis: Difficulty in walking, not elsewhere classified  Unsteadiness on feet  Muscle weakness (generalized)  Other abnormalities of gait and mobility  Problem List Patient Active Problem List   Diagnosis Date Noted   Left pontine cerebrovascular accident (HCC) 10/01/2020   Hyperglycemia 09/25/2020   Stroke (HCC) 09/25/2020   HTN (hypertension)    DM2 (diabetes mellitus, type 2) (HCC)    HLD (hyperlipidemia)    Depression    Acute ischemic stroke (HCC) 09/24/2020    Tempie Donning, PT, DPT 10/22/2020, 9:05 PM  [facility] [address] [place], [place], [address] Phone: [phone]   Fax:  [phone]  Name: Collier Monica MRN: [id number] Date of Birth: [birth year]

## 2020-10-27 ENCOUNTER — Other Ambulatory Visit: Payer: Self-pay

## 2020-10-27 ENCOUNTER — Encounter: Payer: BC Managed Care – PPO | Attending: Registered Nurse | Admitting: Registered Nurse

## 2020-10-27 ENCOUNTER — Encounter: Payer: Self-pay | Admitting: Registered Nurse

## 2020-10-27 VITALS — BP 126/78 | HR 99 | Temp 98.1°F | Ht 67.0 in | Wt 264.6 lb

## 2020-10-27 DIAGNOSIS — I10 Essential (primary) hypertension: Secondary | ICD-10-CM | POA: Diagnosis not present

## 2020-10-27 DIAGNOSIS — I639 Cerebral infarction, unspecified: Secondary | ICD-10-CM

## 2020-10-27 NOTE — Patient Instructions (Addendum)
Call [facility] if you haven't received a call be Wednesday.  [facility]: [phone]  Send a My-Chart or call Riley Lam if you need some assistance.   Please send a My-Chart with PCP appointment
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
## 2020-10-28 ENCOUNTER — Ambulatory Visit: Payer: BC Managed Care – PPO | Admitting: Occupational Therapy

## 2020-10-28 ENCOUNTER — Ambulatory Visit: Payer: BC Managed Care – PPO

## 2020-10-28 ENCOUNTER — Encounter: Payer: Self-pay | Admitting: Occupational Therapy

## 2020-10-28 DIAGNOSIS — I69351 Hemiplegia and hemiparesis following cerebral infarction affecting right dominant side: Secondary | ICD-10-CM

## 2020-10-28 DIAGNOSIS — R278 Other lack of coordination: Secondary | ICD-10-CM

## 2020-10-28 DIAGNOSIS — R2681 Unsteadiness on feet: Secondary | ICD-10-CM

## 2020-10-28 DIAGNOSIS — M6281 Muscle weakness (generalized): Secondary | ICD-10-CM

## 2020-10-28 DIAGNOSIS — R2689 Other abnormalities of gait and mobility: Secondary | ICD-10-CM

## 2020-10-28 DIAGNOSIS — M79601 Pain in right arm: Secondary | ICD-10-CM

## 2020-10-28 DIAGNOSIS — R270 Ataxia, unspecified: Secondary | ICD-10-CM

## 2020-10-28 DIAGNOSIS — R262 Difficulty in walking, not elsewhere classified: Secondary | ICD-10-CM | POA: Diagnosis not present

## 2020-10-28 NOTE — Therapy (Signed)
[facility] [address] [place], [place], [address] Phone: [phone]   Fax:  [phone]  Physical Therapy Treatment  Patient Details  Name: Amber [name] MRN: [id number] Date of Birth: 09/09/1970 Referring Provider (PT): Mariam Dollar, PA-C   Encounter Date: 10/28/2020   PT End of Session - 10/28/20 1442     Visit Number 2    Number of Visits 17    Date for PT Re-Evaluation 12/18/20    Authorization Type BCBS    PT Start Time 1437    PT Stop Time 1522    PT Time Calculation (min) 45 min    Equipment Utilized During Treatment Gait belt    Activity Tolerance Patient tolerated treatment well    Behavior During Therapy WFL for tasks assessed/performed             Past Medical History:  Diagnosis Date   Depression    DM2 (diabetes mellitus, type 2) (HCC)    HLD (hyperlipidemia)    HTN (hypertension)     History reviewed. No pertinent surgical history.  There were no vitals filed for this visit.   Subjective Assessment - 10/28/20 1442     Subjective Patient denies any new issues.    Patient is accompained by: Family member   Fiance   Pertinent History HLD, HTN, DM2, Depression    Limitations Standing;Walking;House hold activities    How long can you walk comfortably? < 5 minutes    Diagnostic tests MRI showed a 10 mm acute infarction in the left pons.  No hemorrhage or mass-effect. Echocardiogram with ejection fraction of 60 to 65% grade 2 diastolic dysfunction    Patient Stated Goals Wants to be Independence, Get Rid of the RW    Currently in Pain? No/denies                [facility] PT Assessment - 10/28/20 1443       6 Minute Walk- Baseline   6 Minute Walk- Baseline yes    BP (mmHg) 148/80    HR (bpm) 94    02 Sat (%RA) 97 %    Modified Borg Scale for Dyspnea 1- Very mild shortness of breath      6 Minute walk- Post Test   6 Minute Walk Post Test yes    BP (mmHg) 158/86    HR (bpm) 98     02 Sat (%RA) 97 %    Modified Borg Scale for Dyspnea 7- Severe shortness of breath or very hard breathing      6 minute walk test results    Aerobic Endurance Distance Walked 345    Endurance additional comments Pt sat briefly after 2 min 30 sec then stopped at 4 min 17 sec                           [facility] Adult PT Treatment/Exercise - 10/28/20 1443       Bed Mobility   Bed Mobility Rolling Right;Rolling Left;Left Sidelying to Sit;Sit to Supine    Rolling Right Independent    Rolling Left Supervision/Verbal cueing   cued to bend knees and reach across to get pelvis and shoulder to help with roll.   Left Sidelying to Sit Contact Guard/Touching assist   cued to stay on side and bring legs off then push up on left elbow and right hand. Practiced with breaking down from sitting down to elbow and back up x  3. PT also discussed option of bed assist rail for home and showed pt examples on [employer].   Sit to Supine Independent      Transfers   Transfers Sit to Stand;Stand to Sit    Sit to Stand 5: Supervision    Comments Sit to stand from mat without hands 5 x 2.      Ambulation/Gait   Ambulation/Gait Yes    Ambulation/Gait Assistance 5: Supervision;4: Min guard    Ambulation/Gait Assistance Details Pt was cued to increase right foot clearance and also be sure that right hand stays on walker as tends to slide off. Pt caught right foot x 2 during gait.    Ambulation Distance (Feet) 345 Feet   during 6 min walk   Assistive device Rolling walker    Gait Pattern Step-through pattern;Decreased hip/knee flexion - right;Decreased dorsiflexion - right    Ambulation Surface Level;Indoor      Neuro Re-ed    Neuro Re-ed Details  Standing alternating toe taps on 4" step x 10 min assist with verbal cues to squeeze bottom on right during stance as had decreased right weight shift CGA/min assist.      Exercises   Exercises Other Exercises    Other Exercises  Sidelying for right  clamshell x 5 with verbal and tactile cues for form.                    PT Education - 10/28/20 1640     Education Details initiated strengthening HEP. Discussed options for bed assist rail.    Person(s) Educated Patient;Other (comment)   fiance   Methods Explanation;Demonstration;Handout    Comprehension Verbalized understanding              PT Short Term Goals - 10/28/20 1838       PT SHORT TERM GOAL #1   Title Patient will be independent with initial HEP for balance/strength (ALL STGs Due: 11/21/2020)    Baseline no HEP established    Time 4    Period Weeks    Status New    Target Date 11/21/20      PT SHORT TERM GOAL #2   Title Patient will improve TUG to </= 12 seconds with LRAD to demo reduced fall risk    Baseline 13.13 secs w/ RW    Time 4    Period Weeks    Status New      PT SHORT TERM GOAL #3   Title Patient will undergo 2MWT/6MWT and LTG to be set    Baseline was performed and LTG written    Time 4    Period Weeks    Status New      PT SHORT TERM GOAL #4   Title Patient will improve Berg Balance to >/= 47/56 to demo reduced fall risk    Baseline 44/56    Time 4    Period Weeks    Status New               PT Long Term Goals - 10/28/20 1840       PT LONG TERM GOAL #1   Title Patient will be independent with final HEP for balance/strength and daily walking program (ALL LTGs Due: 12/19/2020)    Baseline no HEP established    Time 8    Period Weeks    Status New      PT LONG TERM GOAL #2   Title Patient will improve 5x sit <>  stand to </= 10 seconds for improved mobiltiy and reduced fall risk    Baseline 13.65 seconds    Time 8    Period Weeks    Status New      PT LONG TERM GOAL #3   Title Patient will improve gait speed w/ LRAD to 2.5 ft/sec to demo improved community mobility    Baseline 2.1 ft/sec    Time 8    Period Weeks    Status New      PT LONG TERM GOAL #4   Title Patient will improve Berg Balance to >/=  50/56 to demo reduced fall risk and improved balance    Baseline 44/56    Time 8    Period Weeks    Status New      PT LONG TERM GOAL #5   Title Pt will increase 6MWT to >600' completing entire 6 minutes for improved gait ability and activity tolerance.    Baseline 10/28/20  345' stopping at 4 min 17 sec    Time 8    Period Weeks    Status New      PT LONG TERM GOAL #6   Title Patient will improve FOTO to >/= 65%    Baseline 55%    Time 8    Period Weeks    Status New                   Plan - 10/28/20 1838     Clinical Impression Statement PT assessed 6 minute walk test. Pt was unable to complete the whole time due to fatigue. 2 episodes of catching right foot during gait and has difficulty maintaining right hand on walker with reports of feeling like "she is not in her body" at times.    Personal Factors and Comorbidities Comorbidity 3+    Comorbidities HLD, HTN, DM2, Depression    Examination-Activity Limitations Bed Mobility;Sit;Stairs;Transfers;Locomotion Level;Stand    Examination-Participation Restrictions Occupation;Community Activity;Driving    Stability/Clinical Decision Making Stable/Uncomplicated    Rehab Potential Good    PT Frequency 2x / week    PT Duration 8 weeks    PT Treatment/Interventions ADLs/Self Care Home Management;Aquatic Therapy;Cryotherapy;Electrical Stimulation;Moist Heat;DME Instruction;Balance training;Therapeutic exercise;Therapeutic activities;Functional mobility training;Stair training;Gait training;Neuromuscular re-education;Patient/family education;Orthotic Fit/Training;Vestibular;Passive range of motion;Manual techniques    PT Next Visit Plan How did bed mobility go at home. Continue with strengthening and standing balance. Focus on improving right foot clearance and placement.    Consulted and Agree with Plan of Care Patient;Family member/caregiver    Family Member Consulted Fiance             Patient will benefit from skilled  therapeutic intervention in order to improve the following deficits and impairments:  Abnormal gait, Postural dysfunction, Decreased coordination, Decreased activity tolerance, Decreased endurance, Decreased strength, Difficulty walking, Decreased balance, Decreased knowledge of use of DME  Visit Diagnosis: Other abnormalities of gait and mobility  Unsteadiness on feet  Muscle weakness (generalized)     Problem List Patient Active Problem List   Diagnosis Date Noted   Left pontine cerebrovascular accident (HCC) 10/01/2020   Hyperglycemia 09/25/2020   Stroke (HCC) 09/25/2020   HTN (hypertension)    DM2 (diabetes mellitus, type 2) (HCC)    HLD (hyperlipidemia)    Depression    Acute ischemic stroke (HCC) 09/24/2020    Ronn MelenaEmily A Janard Culp, PT, DPT, NCS 10/28/2020, 6:48 PM  [facility] [address] [place], [place], [address] Phone: [phone]  Fax:  [phone]  Name: Amber [name] MRN: [id number] Date of Birth: 1970/11/30

## 2020-10-28 NOTE — Therapy (Signed)
[facility] [address] [place], [place], [address] Phone: [phone]   Fax:  [phone]  Occupational Therapy Treatment  Patient Details  Name: Amber [name] MRN: [id number] Date of Birth: [birth year] Referring Provider (OT): Harvel Ricks   Encounter Date: 10/28/2020   OT End of Session - 10/28/20 1459     Visit Number 2    Number of Visits 17    Date for OT Re-Evaluation 01/05/21    Authorization Type BCBS 2022 - VL:[place]    OT Start Time 1318    OT Stop Time 1400    OT Time Calculation (min) 42 min    Activity Tolerance Patient tolerated treatment well    Behavior During Therapy WFL for tasks assessed/performed             Past Medical History:  Diagnosis Date   Depression    DM2 (diabetes mellitus, type 2) (HCC)    HLD (hyperlipidemia)    HTN (hypertension)     History reviewed. No pertinent surgical history.  There were no vitals filed for this visit.   Subjective Assessment - 10/28/20 1323     Subjective  I could carry a plate and walk into the living room without using walker    Currently in Pain? No/denies    Pain Score 0-No pain                          OT Treatments/Exercises (OP) - 10/28/20 0001       ADLs   Bathing Patient reports imporved independence in shower, but reports that she still lacks ability to maintain right hand up long enough to wash, rinse hair with hand held shower head.    ADL Comments Reviewed short and long term goals for OT.  Patient in agreement.  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      Neurological Re-education Exercises   Other Exercises 1 Neuromuscular reeducation to address postural control and balance in sitting and standing.  Working on alignment and activation of RLE into base with standing and stepping. Patient with reduced ataxia in RUE when firmly standing on RLE.  Worked  on sit to stand alignment, stand to sit, and preliminary stepping while carrying items bilaterally or in RUE. Worked on carrying a glass partially full of water and stepping forward and backward, stabilizing elbow against trunk.  See patient instructions - initiated coordination exercises.                    OT Education - 10/28/20 1458     Education Details coordination exercises for HEP    Person(s) Educated Patient;Spouse    Methods Explanation;Demonstration;Handout;Verbal cues    Comprehension Verbalized understanding;Returned demonstration              OT Short Term Goals - 10/28/20 1501       OT SHORT TERM GOAL #1   Title Patient will complete HEP designed to improve RUE coordination    Time 4    Period Weeks    Status On-going    Target Date 12/06/20      OT SHORT TERM GOAL #2   Title Patient will complete an HEP designed to improve right grip strength    Time 4    Period Weeks    Status On-going      OT SHORT TERM GOAL #3  Title Patient will demonstrate improved ability to maintain arm over head sufficient time to wet / rinse hair with hand held shower head    Time 4    Period Weeks    Status On-going      OT SHORT TERM GOAL #4   Title Patient will self report improved control of vertical motion when brushing teeth    Time 4    Period Weeks    Status On-going      OT SHORT TERM GOAL #5   Title Patient will demonstrate understanding of return to work recommendations    Time 4    Period Weeks    Status On-going      OT SHORT TERM GOAL #6   Title Patient will complete bathing, dressing, and grooming with modified independence consistently in a timely manner in preparation for return to work.    Time 4    Period Weeks    Status On-going               OT Long Term Goals - 10/28/20 1502       OT LONG TERM GOAL #1   Title Patient will complete updated HEP for RUE strength and coordination    Time 8    Period Weeks    Status On-going       OT LONG TERM GOAL #2   Title Patient will demonstrate at least a10 lb increase in right grip strength to ease opening packages, carrying items, etc.    Baseline 35lb    Time 8    Period Weeks    Status On-going      OT LONG TERM GOAL #3   Title Patient will complete 9 hole peg test in 45 sec or less    Baseline 58 sec    Time 8    Period Weeks    Status On-going      OT LONG TERM GOAL #4   Title Patient will demonstrate improved ability to load/unload dishwasher, place dishes in shoulder height cabinets    Time 8    Period Weeks    Status On-going      OT LONG TERM GOAL #5   Title Patient will demonstrate understanding of return to driving recommendations    Time 8    Period Weeks    Status On-going      OT LONG TERM GOAL #6   Title With her right hand, patient will carry a hot beverage without spilling while walking 15 feet level surface    Time 8    Period Weeks    Status On-going      OT LONG TERM GOAL #7   Title Patient will complete FOTO survey at discharge with d/c score of 75    Baseline FS 64, Risk adjusted 52    Time 8    Period Weeks    Status On-going                   Plan - 10/28/20 1500     Clinical Impression Statement Patient is agreeable to OT goals, and is reporting improved performance with ADL and some IADL tasks.    OT Occupational Profile and History Detailed Assessment- Review of Records and additional review of physical, cognitive, psychosocial history related to current functional performance    Occupational performance deficits (Please refer to evaluation for details): ADL's;IADL's;Work;Rest and Sleep;Leisure    Body Structure / Function / Physical Skills ADL;Strength;Balance;Dexterity;GMC;Pain;Tone;Body mechanics;UE functional use;Endurance;IADL;Coordination;Mobility;FMC  Rehab Potential Excellent    Clinical Decision Making Several treatment options, min-mod task modification necessary    Comorbidities Affecting  Occupational Performance: May have comorbidities impacting occupational performance    Modification or Assistance to Complete Evaluation  No modification of tasks or assist necessary to complete eval    OT Frequency 2x / week    OT Duration 8 weeks    OT Treatment/Interventions Self-care/ADL training;DME and/or AE instruction;Splinting;Balance training;Aquatic Therapy;Therapeutic activities;Cognitive remediation/compensation;Therapeutic exercise;Neuromuscular education;Functional Mobility Training;Visual/perceptual remediation/compensation;Patient/family education;Manual Therapy;Electrical Stimulation    Plan NMR RUE, Postural control, dynamic standing balance - Needs to start an HEP to develop proximal strengthening.  Check and add to  coordianation exercises which include typing / handwriting    Consulted and Agree with Plan of Care Patient;Family member/caregiver    Family Member Consulted fiance Sean             Patient will benefit from skilled therapeutic intervention in order to improve the following deficits and impairments:   Body Structure / Function / Physical Skills: ADL, Strength, Balance, Dexterity, GMC, Pain, Tone, Body mechanics, UE functional use, Endurance, IADL, Coordination, Mobility, FMC       Visit Diagnosis: Hemiplegia and hemiparesis following cerebral infarction affecting right dominant side (HCC)  Ataxia  Muscle weakness (generalized)  Pain in right arm  Other lack of coordination  Unsteadiness on feet    Problem List Patient Active Problem List   Diagnosis Date Noted   Left pontine cerebrovascular accident (HCC) 10/01/2020   Hyperglycemia 09/25/2020   Stroke (HCC) 09/25/2020   HTN (hypertension)    DM2 (diabetes mellitus, type 2) (HCC)    HLD (hyperlipidemia)    Depression    Acute ischemic stroke (HCC) 09/24/2020    Collier Salina, OTR/L 10/28/2020, 3:03 PM  [facility] [address] [place], [place], [address] Phone: [phone]   Fax:  [phone]  Name: Amber [name] MRN: [id number] Date of Birth: 1970/07/18

## 2020-10-28 NOTE — Patient Instructions (Signed)
Access Code: [email or web address] URL: [email or web address] Date: 10/28/2020 Prepared by: Elmer Bales  Exercises Supine Bridge - 1 x daily - 5 x weekly - 2 sets - 10 reps Clamshell - 1 x daily - 5 x weekly - 2 sets - 5 reps Sit to Stand - 1 x daily - 5 x weekly - 2 sets - 10 reps

## 2020-10-28 NOTE — Patient Instructions (Signed)
  Coordination Activities  Perform the following activities for 10 minutes 1 times per day with right hand(s). It may be helpful to keep your right elbow on the table.    Flip cards 1 at a time as fast as you can. Rotate card in hand (clockwise and counter-clockwise). Shuffle cards. Pick up coins and place in container or coin bank. Pick up coins and stack. Pick up coins one at a time until you get 5-10 in your hand, then move coins from palm to fingertips to stack one at a time. Practice writing and/or typing.

## 2020-10-29 ENCOUNTER — Ambulatory Visit: Payer: BC Managed Care – PPO

## 2020-10-29 ENCOUNTER — Encounter: Payer: Self-pay | Admitting: Occupational Therapy

## 2020-10-29 ENCOUNTER — Other Ambulatory Visit: Payer: Self-pay

## 2020-10-29 ENCOUNTER — Ambulatory Visit: Payer: BC Managed Care – PPO | Admitting: Occupational Therapy

## 2020-10-29 DIAGNOSIS — R2681 Unsteadiness on feet: Secondary | ICD-10-CM

## 2020-10-29 DIAGNOSIS — M6281 Muscle weakness (generalized): Secondary | ICD-10-CM

## 2020-10-29 DIAGNOSIS — R2689 Other abnormalities of gait and mobility: Secondary | ICD-10-CM

## 2020-10-29 DIAGNOSIS — I69351 Hemiplegia and hemiparesis following cerebral infarction affecting right dominant side: Secondary | ICD-10-CM

## 2020-10-29 DIAGNOSIS — R278 Other lack of coordination: Secondary | ICD-10-CM

## 2020-10-29 DIAGNOSIS — M79601 Pain in right arm: Secondary | ICD-10-CM

## 2020-10-29 DIAGNOSIS — R262 Difficulty in walking, not elsewhere classified: Secondary | ICD-10-CM | POA: Diagnosis not present

## 2020-10-29 NOTE — Patient Instructions (Signed)
Access Code: [email or web address] URL: [email or web address] Date: 10/29/2020 Prepared by: Kallie Edward  Exercises Putty Squeezes - 1 x daily - 7 x weekly - 3 sets - 10 reps Rolling Putty on Table - 1 x daily - 7 x weekly - 3 sets - 10 reps Thumb Opposition with Putty - 1 x daily - 7 x weekly - 3 sets - 10 reps Finger Lumbricals with Putty - 1 x daily - 7 x weekly - 3 sets - 10 reps

## 2020-10-29 NOTE — Therapy (Signed)
[facility] [address] [place], [place], [address] Phone: [phone]   Fax:  [phone]  Physical Therapy Treatment  Patient Details  Name: Amber [name] MRN: [id number] Date of Birth: 07-20-1970 Referring Provider (PT): Mariam Dollar, PA-C   Encounter Date: 10/29/2020   PT End of Session - 10/29/20 1146     Visit Number 3    Number of Visits 17    Date for PT Re-Evaluation 12/18/20    Authorization Type BCBS    PT Start Time 1145    PT Stop Time 1228    PT Time Calculation (min) 43 min    Equipment Utilized During Treatment Gait belt    Activity Tolerance Patient tolerated treatment well    Behavior During Therapy WFL for tasks assessed/performed             Past Medical History:  Diagnosis Date   Depression    DM2 (diabetes mellitus, type 2) (HCC)    HLD (hyperlipidemia)    HTN (hypertension)     History reviewed. No pertinent surgical history.  There were no vitals filed for this visit.   Subjective Assessment - 10/29/20 1146     Subjective Patient reports that bed mobility was much easier than it had been after using the techniques therapy worked on yesterday.    Patient is accompained by: Family member   Fiance   Pertinent History HLD, HTN, DM2, Depression    Limitations Standing;Walking;House hold activities    How long can you walk comfortably? < 5 minutes    Diagnostic tests MRI showed a 10 mm acute infarction in the left pons.  No hemorrhage or mass-effect. Echocardiogram with ejection fraction of 60 to 65% grade 2 diastolic dysfunction    Patient Stated Goals Wants to be Independence, Get Rid of the RW    Currently in Pain? No/denies                               [facility] Adult PT Treatment/Exercise - 10/29/20 1148       Transfers   Transfers Sit to Stand;Stand to Sit    Sit to Stand 5: Supervision    Stand to Sit 5: Supervision      Ambulation/Gait    Ambulation/Gait Yes    Ambulation/Gait Assistance 5: Supervision    Ambulation/Gait Assistance Details around in clinic between activities. Verbal cues to increase right foot clearance.    Assistive device Rolling walker    Gait Pattern Step-through pattern;Decreased hip/knee flexion - right    Ambulation Surface Level;Indoor      Neuro Re-ed    Neuro Re-ed Details  Sit to stand from mat with 4" step under left foot to facilitate right weight shift 5 x 2. Verbal and tactile cues to try to engage right quad more. Standing tapping left foot on 4" step x 10 with fingertip support on walker with left hand and tactile cues at knee and pelvis to weight shift. Standing with LLE on 4" step tossing bean bags x 14 in basket reaching across body to grab with tactile cues at knee to prevent buckling CGA with 1 brief break half way, then performed with staggered stance with LLE in front tossing bean bag x 14 reaching across to the right using right hand. In // bars: reciprocal stepping over 4 yard sticks with light UE support with verbal cues to control right knee and be sure to  clear right foot fully when trailing x 4 bouts. Side stepping over 4 yard sticks with fingertip support x 4 bouts, gait marching forwards then backwards steps with LUE support.                    PT Education - 10/29/20 1258     Education Details Added side stepping at counter to [employer]) Educated Patient;Other (comment)   fiance   Methods Explanation;Demonstration;Handout    Comprehension Verbalized understanding;Returned demonstration              PT Short Term Goals - 10/28/20 1838       PT SHORT TERM GOAL #1   Title Patient will be independent with initial HEP for balance/strength (ALL STGs Due: 11/21/2020)    Baseline no HEP established    Time 4    Period Weeks    Status New    Target Date 11/21/20      PT SHORT TERM GOAL #2   Title Patient will improve TUG to </= 12 seconds with LRAD to demo  reduced fall risk    Baseline 13.13 secs w/ RW    Time 4    Period Weeks    Status New      PT SHORT TERM GOAL #3   Title Patient will undergo 2MWT/6MWT and LTG to be set    Baseline was performed and LTG written    Time 4    Period Weeks    Status New      PT SHORT TERM GOAL #4   Title Patient will improve Berg Balance to >/= 47/56 to demo reduced fall risk    Baseline 44/56    Time 4    Period Weeks    Status New               PT Long Term Goals - 10/28/20 1840       PT LONG TERM GOAL #1   Title Patient will be independent with final HEP for balance/strength and daily walking program (ALL LTGs Due: 12/19/2020)    Baseline no HEP established    Time 8    Period Weeks    Status New      PT LONG TERM GOAL #2   Title Patient will improve 5x sit <> stand to </= 10 seconds for improved mobiltiy and reduced fall risk    Baseline 13.65 seconds    Time 8    Period Weeks    Status New      PT LONG TERM GOAL #3   Title Patient will improve gait speed w/ LRAD to 2.5 ft/sec to demo improved community mobility    Baseline 2.1 ft/sec    Time 8    Period Weeks    Status New      PT LONG TERM GOAL #4   Title Patient will improve Berg Balance to >/= 50/56 to demo reduced fall risk and improved balance    Baseline 44/56    Time 8    Period Weeks    Status New      PT LONG TERM GOAL #5   Title Pt will increase to >600' completing entire 6 minutes for improved gait ability and activity tolerance.    Baseline 10/28/20  345' stopping at 4 min 17 sec    Time 8    Period Weeks    Status New      PT LONG TERM GOAL #6  Title Patient will improve FOTO to >/= 65%    Baseline 55%    Time 8    Period Weeks    Status New                   Plan - 10/29/20 1259     Clinical Impression Statement Pt was able to demonstrate better right knee control with balance/gait activities today. She reported feeling more " in her body" today.    Personal Factors and  Comorbidities Comorbidity 3+    Comorbidities HLD, HTN, DM2, Depression    Examination-Activity Limitations Bed Mobility;Sit;Stairs;Transfers;Locomotion Level;Stand    Examination-Participation Restrictions Occupation;Community Activity;Driving    Stability/Clinical Decision Making Stable/Uncomplicated    Rehab Potential Good    PT Frequency 2x / week    PT Duration 8 weeks    PT Treatment/Interventions ADLs/Self Care Home Management;Aquatic Therapy;Cryotherapy;Electrical Stimulation;Moist Heat;DME Instruction;Balance training;Therapeutic exercise;Therapeutic activities;Functional mobility training;Stair training;Gait training;Neuromuscular re-education;Patient/family education;Orthotic Fit/Training;Vestibular;Passive range of motion;Manual techniques    PT Next Visit Plan Continue with strengthening and standing balance. Focus on improving right foot clearance and placement.    Consulted and Agree with Plan of Care Patient;Family member/caregiver    Family Member Consulted Fiance             Patient will benefit from skilled therapeutic intervention in order to improve the following deficits and impairments:  Abnormal gait, Postural dysfunction, Decreased coordination, Decreased activity tolerance, Decreased endurance, Decreased strength, Difficulty walking, Decreased balance, Decreased knowledge of use of DME  Visit Diagnosis: Other abnormalities of gait and mobility  Muscle weakness (generalized)  Unsteadiness on feet     Problem List Patient Active Problem List   Diagnosis Date Noted   Left pontine cerebrovascular accident (HCC) 10/01/2020   Hyperglycemia 09/25/2020   Stroke (HCC) 09/25/2020   HTN (hypertension)    DM2 (diabetes mellitus, type 2) (HCC)    HLD (hyperlipidemia)    Depression    Acute ischemic stroke (HCC) 09/24/2020    Ronn Melena, PT, DPT, NCS 10/29/2020, 1:03 PM   [facility] [address]  [address] [place], [place], [address] Phone: [phone]   Fax:  [phone]  Name: Amber [name] MRN: [id number] Date of Birth: [birth year]

## 2020-10-29 NOTE — Therapy (Signed)
[facility] [address] [place], [place], [address] Phone: [phone]   Fax:  [phone]  Occupational Therapy Treatment  Patient Details  Name: Temple Ewart MRN: [id number] Date of Birth: 1970/12/12 Referring Provider (OT): Silvestre Mesi   Encounter Date: 10/29/2020   OT End of Session - 10/29/20 1112     Visit Number 3    Number of Visits 17    Date for OT Re-Evaluation 01/05/21    Authorization Type BCBS 2022 - [place]    OT Start Time 1108   arrival time   OT Stop Time 1145    OT Time Calculation (min) 37 min    Activity Tolerance Patient tolerated treatment well    Behavior During Therapy WFL for tasks assessed/performed             Past Medical History:  Diagnosis Date   Depression    DM2 (diabetes mellitus, type 2) ([place])    HLD (hyperlipidemia)    HTN (hypertension)     History reviewed. No pertinent surgical history.  There were no vitals filed for this visit.   Subjective Assessment - 10/29/20 1110     Subjective  "I met one of my goals - I was able to wash my hair and hold it up with no fatigue."    Patient is accompanied by: Family member   fiance, Hilliard Clark   Currently in Pain? No/denies                          OT Treatments/Exercises (OP) - 10/29/20 0001       ADLs   Bathing Pt did some shaving today and reports doing her whole shower today with only supervision (distant)      Exercises   Exercises Theraputty;Hand      Theraputty   Theraputty - Flatten red theraputty - see pt instructions      Neurological Re-education Exercises   Other Exercises 1 Standing and working on weight shifting and postural control with reaching - pt stood at sink and did anterior/posterior weight shifts and then stood at sink with reaching with RUE and placing hand reaching overhead and then shifting to reach to right on cabinets.      Fine Motor Coordination (Hand/Wrist)   Fine Motor  Coordination Small Pegboard;Grooved pegs    Small Pegboard medium pegs with LUE with slightly increased time but with min drops and with good in hand manipulation with min difficulty                    OT Education - 10/29/20 1116     Education Details issued red theraputty Access Code: [email or web address]    Person(s) Educated Patient;Spouse    Methods Explanation;Demonstration;Handout    Comprehension Verbalized understanding;Returned demonstration              OT Short Term Goals - 10/29/20 1112       OT SHORT TERM GOAL #1   Title Patient will complete HEP designed to improve RUE coordination    Time 4    Period Weeks    Status On-going    Target Date 12/06/20      OT SHORT TERM GOAL #2   Title Patient will complete an HEP designed to improve right grip strength    Time 4    Period Weeks    Status On-going      OT SHORT TERM GOAL #3   Title  Patient will demonstrate improved ability to maintain arm over head sufficient time to wet / rinse hair with hand held shower head    Time 4    Period Weeks    Status Achieved      OT SHORT TERM GOAL #4   Title Patient will self report improved control of vertical motion when brushing teeth    Time 4    Period Weeks    Status On-going      OT SHORT TERM GOAL #5   Title Patient will demonstrate understanding of return to work recommendations    Time 4    Period Weeks    Status On-going      OT SHORT TERM GOAL #6   Title Patient will complete bathing, dressing, and grooming with modified independence consistently in a timely manner in preparation for return to work.    Time 4    Period Weeks    Status On-going               OT Long Term Goals - 10/28/20 1502       OT LONG TERM GOAL #1   Title Patient will complete updated HEP for RUE strength and coordination    Time 8    Period Weeks    Status On-going      OT LONG TERM GOAL #2   Title Patient will demonstrate at least a10 lb increase in right grip  strength to ease opening packages, carrying items, etc.    Baseline 35lb    Time 8    Period Weeks    Status On-going      OT LONG TERM GOAL #3   Title Patient will complete 9 hole peg test in 45 sec or less    Baseline 58 sec    Time 8    Period Weeks    Status On-going      OT LONG TERM GOAL #4   Title Patient will demonstrate improved ability to load/unload dishwasher, place dishes in shoulder height cabinets    Time 8    Period Weeks    Status On-going      OT LONG TERM GOAL #5   Title Patient will demonstrate understanding of return to driving recommendations    Time 8    Period Weeks    Status On-going      OT LONG TERM GOAL #6   Title With her right hand, patient will carry a hot beverage without spilling while walking 15 feet level surface    Time 8    Period Weeks    Status On-going      OT LONG TERM GOAL #7   Title Patient will complete FOTO survey at discharge with d/c score of 75    Baseline FS 64, Risk adjusted 52    Time 8    Period Weeks    Status On-going                   Plan - 10/29/20 1138     Clinical Impression Statement Pt is progressing with goals and increased independence and participation. Pt is very motivated.    OT Occupational Profile and History Detailed Assessment- Review of Records and additional review of physical, cognitive, psychosocial history related to current functional performance    Occupational performance deficits (Please refer to evaluation for details): ADL's;IADL's;Work;Rest and Sleep;Leisure    Body Structure / Function / Physical Skills ADL;Strength;Balance;Dexterity;GMC;Pain;Tone;Body mechanics;UE functional use;Endurance;IADL;Coordination;Mobility;FMC    Rehab Potential Excellent  Clinical Decision Making Several treatment options, min-mod task modification necessary    Comorbidities Affecting Occupational Performance: May have comorbidities impacting occupational performance    Modification or Assistance  to Complete Evaluation  No modification of tasks or assist necessary to complete eval    OT Frequency 2x / week    OT Duration 8 weeks    OT Treatment/Interventions Self-care/ADL training;DME and/or AE instruction;Splinting;Balance training;Aquatic Therapy;Therapeutic activities;Cognitive remediation/compensation;Therapeutic exercise;Neuromuscular education;Functional Mobility Training;Visual/perceptual remediation/compensation;Patient/family education;Manual Therapy;Electrical Stimulation    Plan NMR RUE, Postural control, dynamic standing balance - Needs to start an HEP to develop proximal strengthening.  Check and add to  coordianation exercises which include typing / handwriting    Consulted and Agree with Plan of Care Patient;Family member/caregiver    Family Member Consulted fiance Sean             Patient will benefit from skilled therapeutic intervention in order to improve the following deficits and impairments:   Body Structure / Function / Physical Skills: ADL, Strength, Balance, Dexterity, GMC, Pain, Tone, Body mechanics, UE functional use, Endurance, IADL, Coordination, Mobility, Wright       Visit Diagnosis: Hemiplegia and hemiparesis following cerebral infarction affecting right dominant side (H[facility])  Muscle weakness (generalized)  Pain in right arm  Other lack of coordination  Unsteadiness on feet    Problem List Patient Active Problem List   Diagnosis Date Noted   Left pontine cerebrovascular accident ([place]) 10/01/2020   Hyperglycemia 09/25/2020   Stroke ([place]) 09/25/2020   HTN (hypertension)    DM2 (diabetes mellitus, type 2) ([place])    HLD (hyperlipidemia)    Depression    Acute ischemic stroke ([facility]) 09/24/2020    Zachery Conch MOT, OTR/L  10/29/2020, 11:49 AM  [facility] [address] [place], [place], [address] Phone: [phone]   Fax:  [phone]  Name: Zayna Toste MRN:  [id number] Date of Birth: October 05, 1970

## 2020-10-29 NOTE — Patient Instructions (Signed)
Access Code: [email or web address] URL: [email or web address] Date: 10/29/2020 Prepared by: Elmer Bales  Exercises Supine Bridge - 1 x daily - 5 x weekly - 2 sets - 10 reps Clamshell - 1 x daily - 5 x weekly - 2 sets - 5 reps Sit to Stand - 1 x daily - 5 x weekly - 2 sets - 10 reps Side Stepping with Counter Support - 1 x daily - 5 x weekly - 1 sets - 3-4 reps

## 2020-10-30 ENCOUNTER — Encounter: Payer: Self-pay | Admitting: Physician Assistant

## 2020-11-03 ENCOUNTER — Telehealth: Payer: Self-pay

## 2020-11-03 ENCOUNTER — Telehealth: Payer: Self-pay | Admitting: *Deleted

## 2020-11-03 DIAGNOSIS — E1159 Type 2 diabetes mellitus with other circulatory complications: Secondary | ICD-10-CM

## 2020-11-03 MED ORDER — BASAGLAR KWIKPEN 100 UNIT/ML ~~LOC~~ SOPN: 70.0000 [IU] | PEN_INJECTOR | Freq: Every day | SUBCUTANEOUS | 3 refills | Status: DC

## 2020-11-03 NOTE — Telephone Encounter (Signed)
Pt has a new patient appt on 11/28/20 with Dr. Manson Passey.  She in out of her long acting insulin and is requesting a refill until her appt.  Her insurance will NOT cover lantus but will cover basaglar.  To Dr. Manson Passey for next steps. Jone Baseman, CMA

## 2020-11-03 NOTE — Telephone Encounter (Signed)
Pt called in about meds not able to get meds and needing a pre autho for another medication insurance is suggestion please assist

## 2020-11-03 NOTE — Telephone Encounter (Signed)
Rx for basaglar to pharmacy. Please let patient know/   Thank you  Terisa Starr, MD  [facility] Medicine Teaching Service

## 2020-11-03 NOTE — Telephone Encounter (Signed)
Attempted to call pt no answer, will try again later. Torrez Renfroe, CMA  

## 2020-11-04 ENCOUNTER — Other Ambulatory Visit: Payer: Self-pay

## 2020-11-04 ENCOUNTER — Ambulatory Visit: Payer: BC Managed Care – PPO

## 2020-11-04 ENCOUNTER — Ambulatory Visit: Payer: BC Managed Care – PPO | Admitting: Occupational Therapy

## 2020-11-04 ENCOUNTER — Encounter: Payer: Self-pay | Admitting: Occupational Therapy

## 2020-11-04 DIAGNOSIS — M6281 Muscle weakness (generalized): Secondary | ICD-10-CM

## 2020-11-04 DIAGNOSIS — R2689 Other abnormalities of gait and mobility: Secondary | ICD-10-CM

## 2020-11-04 DIAGNOSIS — R278 Other lack of coordination: Secondary | ICD-10-CM

## 2020-11-04 DIAGNOSIS — M79601 Pain in right arm: Secondary | ICD-10-CM

## 2020-11-04 DIAGNOSIS — R262 Difficulty in walking, not elsewhere classified: Secondary | ICD-10-CM | POA: Diagnosis not present

## 2020-11-04 DIAGNOSIS — R2681 Unsteadiness on feet: Secondary | ICD-10-CM

## 2020-11-04 DIAGNOSIS — I69351 Hemiplegia and hemiparesis following cerebral infarction affecting right dominant side: Secondary | ICD-10-CM

## 2020-11-04 NOTE — Patient Instructions (Signed)
RIGHT HANDED TYPING WORDS pink oink pill hike hip omen play plan king noon plug jug mom pop mop him moon lime poultry fiction lint bundle typing nominal your boil coil soil numb pick lick humble jump hump bunk funk plump dump out tuition motion notion potion lotion jungle buy book hook nun none  

## 2020-11-04 NOTE — Therapy (Signed)
[facility] [address] [place], [place], [address] Phone: [phone]   Fax:  [phone]  Occupational Therapy Treatment  Patient Details  Name: Amber [name] MRN: [id number] Date of Birth: Oct 22, 1970 Referring Provider (OT): Harvel Ricks   Encounter Date: 11/04/2020   OT End of Session - 11/04/20 1402     Visit Number 4    Number of Visits 17    Date for OT Re-Evaluation 01/05/21    Authorization Type BCBS 2022 - VL:[place]    OT Start Time 1318    OT Stop Time 1400    OT Time Calculation (min) 42 min    Activity Tolerance Patient tolerated treatment well    Behavior During Therapy WFL for tasks assessed/performed             Past Medical History:  Diagnosis Date   Depression    DM2 (diabetes mellitus, type 2) ([facility])    HLD (hyperlipidemia)    HTN (hypertension)     History reviewed. No pertinent surgical history.  There were no vitals filed for this visit.   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 No/denies    Pain Score 1     Pain Location Back    Pain Orientation Right    Pain Descriptors / Indicators Aching    Pain Type Acute pain    Pain Frequency Intermittent                          OT Treatments/Exercises (OP) - 11/04/20 1324       ADLs   Bathing showering with minimal assistance with getting facial cleanser and assistance with shaving.    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 Pt reports doing most of the moving without use of walker. Pt reports no falls.    ADL Comments Keyboarding: Typing Master Test with 12 wpm, 71% accuracy and 8 wpm net speed      Fine Motor Coordination (Hand/Wrist)   Fine Motor  Coordination Handwriting;Tracing    Handwriting writing first and last name and sentence with RUE with different handwriting tools for building up utensil. Pt with most success with tan/brown foam grip.    Tracing shapes and uppercase letters with RUE with highlighter (wide barrel) with up to 1/4" deviation at times from line                    OT Education - 11/04/20 1406     Education Details right handed typing words    Person(s) Educated Patient    Methods Explanation;Handout    Comprehension Verbalized understanding              OT Short Term Goals - 10/29/20 1112       OT SHORT TERM GOAL #1   Title Patient will complete HEP designed to improve RUE coordination    Time 4    Period Weeks    Status On-going    Target Date 12/06/20      OT SHORT TERM GOAL #2   Title Patient will complete an HEP designed to improve right grip strength  Time 4    Period Weeks    Status On-going      OT SHORT TERM GOAL #3   Title Patient will demonstrate improved ability to maintain arm over head sufficient time to wet / rinse hair with hand held shower head    Time 4    Period Weeks    Status Achieved      OT SHORT TERM GOAL #4   Title Patient will self report improved control of vertical motion when brushing teeth    Time 4    Period Weeks    Status On-going      OT SHORT TERM GOAL #5   Title Patient will demonstrate understanding of return to work recommendations    Time 4    Period Weeks    Status On-going      OT SHORT TERM GOAL #6   Title Patient will complete bathing, dressing, and grooming with modified independence consistently in a timely manner in preparation for return to work.    Time 4    Period Weeks    Status On-going               OT Long Term Goals - 10/28/20 1502       OT LONG TERM GOAL #1   Title Patient will complete updated HEP for RUE strength and coordination    Time 8    Period Weeks    Status On-going      OT LONG TERM  GOAL #2   Title Patient will demonstrate at least a10 lb increase in right grip strength to ease opening packages, carrying items, etc.    Baseline 35lb    Time 8    Period Weeks    Status On-going      OT LONG TERM GOAL #3   Title Patient will complete 9 hole peg test in 45 sec or less    Baseline 58 sec    Time 8    Period Weeks    Status On-going      OT LONG TERM GOAL #4   Title Patient will demonstrate improved ability to load/unload dishwasher, place dishes in shoulder height cabinets    Time 8    Period Weeks    Status On-going      OT LONG TERM GOAL #5   Title Patient will demonstrate understanding of return to driving recommendations    Time 8    Period Weeks    Status On-going      OT LONG TERM GOAL #6   Title With her right hand, patient will carry a hot beverage without spilling while walking 15 feet level surface    Time 8    Period Weeks    Status On-going      OT LONG TERM GOAL #7   Title Patient will complete FOTO survey at discharge with d/c score of 75    Baseline FS 64, Risk adjusted 52    Time 8    Period Weeks    Status On-going                   Plan - 11/04/20 1405     Clinical Impression Statement Pt is progressing with increasing participation and independence with ADLs and IADLs. Pt successful with foam grip (tan) for handwriting and was issued right handed typing words for [facility]    OT Occupational Profile and History Detailed Assessment- Review of Records and additional review of physical, cognitive, psychosocial history  related to current functional performance    Occupational performance deficits (Please refer to evaluation for details): ADL's;IADL's;Work;Rest and Sleep;Leisure    Body Structure / Function / Physical Skills ADL;Strength;Balance;Dexterity;GMC;Pain;Tone;Body mechanics;UE functional use;Endurance;IADL;Coordination;Mobility;FMC    Rehab Potential Excellent    Clinical Decision Making Several treatment options, min-mod  task modification necessary    Comorbidities Affecting Occupational Performance: May have comorbidities impacting occupational performance    Modification or Assistance to Complete Evaluation  No modification of tasks or assist necessary to complete eval    OT Frequency 2x / week    OT Duration 8 weeks    OT Treatment/Interventions Self-care/ADL training;DME and/or AE instruction;Splinting;Balance training;Aquatic Therapy;Therapeutic activities;Cognitive remediation/compensation;Therapeutic exercise;Neuromuscular education;Functional Mobility Training;Visual/perceptual remediation/compensation;Patient/family education;Manual Therapy;Electrical Stimulation    Plan proximal strengthening - assess RUE shoulder pain. NMR RUE, Postural control, dynamic standing balance    Consulted and Agree with Plan of Care Patient;Family member/caregiver    Family Member Consulted fiance Sean             Patient will benefit from skilled therapeutic intervention in order to improve the following deficits and impairments:   Body Structure / Function / Physical Skills: ADL, Strength, Balance, Dexterity, GMC, Pain, Tone, Body mechanics, UE functional use, Endurance, IADL, Coordination, Mobility, FMC       Visit Diagnosis: Hemiplegia and hemiparesis following cerebral infarction affecting right dominant side (HCC)  Pain in right arm  Muscle weakness (generalized)  Other lack of coordination  Other abnormalities of gait and mobility  Unsteadiness on feet    Problem List Patient Active Problem List   Diagnosis Date Noted   Left pontine cerebrovascular accident ([facility]) 10/01/2020   Hyperglycemia 09/25/2020   Stroke ([facility]) 09/25/2020   HTN (hypertension)    DM2 (diabetes mellitus, type 2) ([facility])    HLD (hyperlipidemia)    Depression    Acute ischemic stroke ([facility]) 09/24/2020    Junious Dresser MOT, OTR/L  11/04/2020, 2:06 PM  [facility] [address] [place], [place], [address] Phone: [phone]   Fax:  [phone]  Name: Amber [name] MRN: [id number] Date of Birth: 1970-05-22

## 2020-11-04 NOTE — Therapy (Signed)
[facility] [address] [place], [place], [address] Phone: [phone]   Fax:  [phone]  Physical Therapy Treatment  Patient Details  Name: Amber [name] MRN: [id number] Date of Birth: Jun 02, 1970 Referring Provider (PT): Mariam Dollar, PA-C   Encounter Date: 11/04/2020   PT End of Session - 11/04/20 1406     Visit Number 4    Number of Visits 17    Date for PT Re-Evaluation 12/18/20    Authorization Type BCBS    PT Start Time 1401    PT Stop Time 1442    PT Time Calculation (min) 41 min    Equipment Utilized During Treatment Gait belt    Activity Tolerance Patient tolerated treatment well    Behavior During Therapy WFL for tasks assessed/performed             Past Medical History:  Diagnosis Date   Depression    DM2 (diabetes mellitus, type 2) (HCC)    HLD (hyperlipidemia)    HTN (hypertension)     History reviewed. No pertinent surgical history.  There were no vitals filed for this visit.   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 She did not use walker when moving but did today. 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 She reports that she did it well. She is a little concerned about how far she has to walk from front office to her classroom and will try that tomorrow.    Patient is accompained by: Family member   Fiance   Pertinent History HLD, HTN, DM2, Depression    Limitations Standing;Walking;House hold activities    How long can you walk comfortably? < 5 minutes    Diagnostic tests MRI showed a 10 mm acute infarction in the left pons.  No hemorrhage or mass-effect. Echocardiogram with ejection fraction of 60 to 65% grade 2 diastolic dysfunction    Patient Stated Goals Wants to be Independence, Get Rid of the RW    Currently in Pain? Yes    Pain  Score 5     Pain Location Shoulder    Pain Orientation Right    Pain Descriptors / Indicators Aching    Pain Type Acute pain    Pain Onset More than a month ago    Pain Frequency Intermittent    Aggravating Factors  using arm a lot today with writing                               [facility] Adult PT Treatment/Exercise - 11/04/20 1411       Transfers   Transfers Sit to Stand;Stand to Sit    Sit to Stand 5: Supervision    Stand to Sit 5: Supervision      Ambulation/Gait   Ambulation/Gait Yes    Ambulation/Gait Assistance 5: Supervision    Ambulation/Gait Assistance Details BP=130/72 after initial gait with walker. Pt was given verbal cues to increase step length and right hip/knee flexion. More reciprocal pattern today with better clearance. Pt trialed with no AD per her reports of walking without some this weekend but did note increased left pelvic drop during right stance and less right toe off. Added cane for last bout which improved pelvic stability with slight drop at right  toe off. Pt reported feeling better with cane than without. PT advised to use walker for all longer distances to prevent compensatory strategies and poor gait quality. Will continue to practice with cane in clinic.    Ambulation Distance (Feet) 230 Feet   115' without AD then 45' with cane with quad tip   Assistive device Rolling walker    Gait Pattern Step-through pattern;Decreased hip/knee flexion - right    Ambulation Surface Level;Indoor      Self-Care   Self-Care Other Self-Care Comments    Other Self-Care Comments  PT discussed importance of pacing herself with activities especially if she is going back to school.      Neuro Re-ed    Neuro Re-ed Details  In // bars: reciprocal steps over 4 2x4" bolsters first with bilateral UE support x 4 bouts then with only LUE support x 4 bouts. Pt was cued to focus on right foot clearance especially when trailing as caught a couple times. Improved as  went on. Seated break between activities.      Exercises   Exercises Knee/Hip      Knee/Hip Exercises: Aerobic   Stepper Sci Fit x 4 min level 2.1 with arms at 6 and seat at 16 with verbal cues to pace herself as she went. Pt tired at end.                      PT Short Term Goals - 10/28/20 1838       PT SHORT TERM GOAL #1   Title Patient will be independent with initial HEP for balance/strength (ALL STGs Due: 11/21/2020)    Baseline no HEP established    Time 4    Period Weeks    Status New    Target Date 11/21/20      PT SHORT TERM GOAL #2   Title Patient will improve TUG to </= 12 seconds with LRAD to demo reduced fall risk    Baseline 13.13 secs w/ RW    Time 4    Period Weeks    Status New      PT SHORT TERM GOAL #3   Title Patient will undergo 2MWT/6MWT and LTG to be set    Baseline was performed and LTG written    Time 4    Period Weeks    Status New      PT SHORT TERM GOAL #4   Title Patient will improve Berg Balance to >/= 47/56 to demo reduced fall risk    Baseline 44/56    Time 4    Period Weeks    Status New               PT Long Term Goals - 10/28/20 1840       PT LONG TERM GOAL #1   Title Patient will be independent with final HEP for balance/strength and daily walking program (ALL LTGs Due: 12/19/2020)    Baseline no HEP established    Time 8    Period Weeks    Status New      PT LONG TERM GOAL #2   Title Patient will improve 5x sit <> stand to </= 10 seconds for improved mobiltiy and reduced fall risk    Baseline 13.65 seconds    Time 8    Period Weeks    Status New      PT LONG TERM GOAL #3   Title Patient will improve gait speed w/ LRAD  to 2.5 ft/sec to demo improved community mobility    Baseline 2.1 ft/sec    Time 8    Period Weeks    Status New      PT LONG TERM GOAL #4   Title Patient will improve Berg Balance to >/= 50/56 to demo reduced fall risk and improved balance    Baseline 44/56    Time 8     Period Weeks    Status New      PT LONG TERM GOAL #5   Title Pt will increase to >600' completing entire 6 minutes for improved gait ability and activity tolerance.    Baseline 10/28/20  345' stopping at 4 min 17 sec    Time 8    Period Weeks    Status New      PT LONG TERM GOAL #6   Title Patient will improve FOTO to >/= 65%    Baseline 55%    Time 8    Period Weeks    Status New                   Plan - 11/04/20 1504     Clinical Impression Statement Pt was able to demonstrate better right leg advancement with gait today. Will plan to continue to work more on cane but advised pt that walker for longer distances is still adviseable.    Personal Factors and Comorbidities Comorbidity 3+    Comorbidities HLD, HTN, DM2, Depression    Examination-Activity Limitations Bed Mobility;Sit;Stairs;Transfers;Locomotion Level;Stand    Examination-Participation Restrictions Occupation;Community Activity;Driving    Stability/Clinical Decision Making Stable/Uncomplicated    Rehab Potential Good    PT Frequency 2x / week    PT Duration 8 weeks    PT Treatment/Interventions ADLs/Self Care Home Management;Aquatic Therapy;Cryotherapy;Electrical Stimulation;Moist Heat;DME Instruction;Balance training;Therapeutic exercise;Therapeutic activities;Functional mobility training;Stair training;Gait training;Neuromuscular re-education;Patient/family education;Orthotic Fit/Training;Vestibular;Passive range of motion;Manual techniques    PT Next Visit Plan Continue with strengthening and standing balance. Focus on improving right foot clearance and placement. Gait with cane with quad tip. If goes well have pt try to get one for home. Sci Fit for aerobic endurance (performed at level 2.1 x 4 min with arms at 6 and seat at 16 today)    Consulted and Agree with Plan of Care Patient;Family member/caregiver    Family Member Consulted Fiance             Patient will benefit from skilled therapeutic  intervention in order to improve the following deficits and impairments:  Abnormal gait, Postural dysfunction, Decreased coordination, Decreased activity tolerance, Decreased endurance, Decreased strength, Difficulty walking, Decreased balance, Decreased knowledge of use of DME  Visit Diagnosis: Other abnormalities of gait and mobility  Muscle weakness (generalized)  Unsteadiness on feet     Problem List Patient Active Problem List   Diagnosis Date Noted   Left pontine cerebrovascular accident (HCC) 10/01/2020   Hyperglycemia 09/25/2020   Stroke (HCC) 09/25/2020   HTN (hypertension)    DM2 (diabetes mellitus, type 2) (HCC)    HLD (hyperlipidemia)    Depression    Acute ischemic stroke (HCC) 09/24/2020    Ronn Melena, PT, DPT, NCS 11/04/2020, 3:08 PM  [facility] [address] [place], [place], [address] Phone: [phone]   Fax:  [phone]  Name: Maedell Hedger MRN: [id number] Date of Birth: [birth year]

## 2020-11-04 NOTE — Telephone Encounter (Signed)
Medical Assistant left message on patient's home and cell voicemail. Voicemail states to give a call back to [place] with [facility] at [phone]. Patient should return a call confirming the name of the insulin insurance prefers so provider is able to send into pharmacy.

## 2020-11-06 ENCOUNTER — Ambulatory Visit: Payer: BC Managed Care – PPO

## 2020-11-06 ENCOUNTER — Encounter: Payer: BC Managed Care – PPO | Admitting: Occupational Therapy

## 2020-11-06 ENCOUNTER — Encounter: Payer: Self-pay | Admitting: Family Medicine

## 2020-11-06 NOTE — Telephone Encounter (Signed)
Please schedule as a virtual visit with Dr. Nobie Putnam.   Terisa Starr, MD  Family Medicine Teaching Service

## 2020-11-07 ENCOUNTER — Telehealth (INDEPENDENT_AMBULATORY_CARE_PROVIDER_SITE_OTHER): Payer: BC Managed Care – PPO | Admitting: Family Medicine

## 2020-11-07 DIAGNOSIS — U071 COVID-19: Secondary | ICD-10-CM | POA: Diagnosis not present

## 2020-11-07 MED ORDER — NIRMATRELVIR/RITONAVIR (PAXLOVID)TABLET: 3.0000 | ORAL_TABLET | Freq: Two times a day (BID) | ORAL | 0 refills | Status: AC

## 2020-11-07 MED ORDER — NIRMATRELVIR/RITONAVIR (PAXLOVID)TABLET: 3.0000 | ORAL_TABLET | Freq: Two times a day (BID) | ORAL | 0 refills | Status: DC

## 2020-11-07 NOTE — Assessment & Plan Note (Signed)
Patient with signs and symptoms consistent with COVID with positive at-home COVID test.  Due to comorbidities including recent stroke, hyperlipidemia, diabetes patient does meet criteria for Paxlovid.  She is in the treatment window.  Discussed medication with the patient and informed her that she will need to discontinue her statin medication while taking Paxlovid and can restart it 12 hours after completion of the regimen.  Also discussed over-the-counter measures such as cough syrups, hot tea with honey, ibuprofen and Tylenol.  Provided strict ED precautions.

## 2020-11-07 NOTE — Telephone Encounter (Signed)
Received message from patient regarding rx for Paxlovid. Rx was set to print. Resent electronically, receipt confirmed by pharmacy.   FYI  Veronda Prude, RN

## 2020-11-07 NOTE — Progress Notes (Signed)
[facility] Telemedicine Visit  Patient consented to have virtual visit and was identified by name and date of birth. Method of visit: Video  Encounter participants: Patient: Amber [name] - located at home  Provider: Derrel Nip - located at Home  Chief Complaint: COVID positive   HPI:  Patient has an appointment today due to testing positive for COVID.  She moved last weekend and one of the people that was helping her move called to let her know he was positive.  She started having symptoms on Wednesday of this week and tested positive yesterday.  Reports scratchy throat, congestion, runny nose, chest tightness which she describes as "like something is pressing on my chest".  This feeling comes and goes and resolves spontaneously.  She has been taking ibuprofen but no other over-the-counter medications.  Denies any shortness of breath or chest pain.  He is vaccinated inpatient against COVID-19.   ROS: per HPI  Pertinent PMHx: CVA 2022  Exam:  LMP 10/08/2020 (Approximate)   Respiratory: Normal work of breathing, speaking in clear sentences without difficulty.  Assessment/Plan:  COVID-19 Patient with signs and symptoms consistent with COVID with positive at-home COVID test.  Due to comorbidities including recent stroke, hyperlipidemia, diabetes patient does meet criteria for Paxlovid.  She is in the treatment window.  Discussed medication with the patient and informed her that she will need to discontinue her statin medication while taking Paxlovid and can restart it 12 hours after completion of the regimen.  Also discussed over-the-counter measures such as cough syrups, hot tea with honey, ibuprofen and Tylenol.  Provided strict ED precautions.    Time spent during visit with patient: 16 minutes

## 2020-11-11 ENCOUNTER — Ambulatory Visit: Payer: BC Managed Care – PPO

## 2020-11-11 ENCOUNTER — Ambulatory Visit: Payer: BC Managed Care – PPO | Admitting: Occupational Therapy

## 2020-11-13 ENCOUNTER — Ambulatory Visit: Payer: BC Managed Care – PPO | Admitting: Occupational Therapy

## 2020-11-13 ENCOUNTER — Ambulatory Visit: Payer: BC Managed Care – PPO

## 2020-11-16 ENCOUNTER — Encounter: Payer: Self-pay | Admitting: Family Medicine

## 2020-11-18 ENCOUNTER — Ambulatory Visit: Payer: BC Managed Care – PPO

## 2020-11-18 ENCOUNTER — Ambulatory Visit: Payer: BC Managed Care – PPO | Admitting: Occupational Therapy

## 2020-11-18 MED ORDER — GLIPIZIDE 5 MG PO TABS: 5.0000 mg | ORAL_TABLET | Freq: Every day | ORAL | 0 refills | Status: DC

## 2020-11-18 MED ORDER — LISINOPRIL-HYDROCHLOROTHIAZIDE 20-12.5 MG PO TABS: 1.0000 | ORAL_TABLET | Freq: Every day | ORAL | 0 refills | Status: DC

## 2020-11-18 MED ORDER — SERTRALINE HCL 100 MG PO TABS: 100.0000 mg | ORAL_TABLET | Freq: Every day | ORAL | 0 refills | Status: DC

## 2020-11-18 MED ORDER — CLOPIDOGREL BISULFATE 75 MG PO TABS: 75.0000 mg | ORAL_TABLET | Freq: Every day | ORAL | 2 refills | Status: DC

## 2020-11-18 NOTE — Addendum Note (Signed)
Addended by: Manson Passey, Afrika Brick on: 11/18/2020 08:44 PM   Modules accepted: Orders

## 2020-11-20 ENCOUNTER — Telehealth: Payer: Self-pay | Admitting: Family Medicine

## 2020-11-20 ENCOUNTER — Ambulatory Visit: Payer: BC Managed Care – PPO | Admitting: Occupational Therapy

## 2020-11-20 ENCOUNTER — Other Ambulatory Visit: Payer: Self-pay

## 2020-11-20 ENCOUNTER — Encounter: Payer: Self-pay | Admitting: Occupational Therapy

## 2020-11-20 ENCOUNTER — Ambulatory Visit: Payer: BC Managed Care – PPO | Attending: Physician Assistant

## 2020-11-20 DIAGNOSIS — R270 Ataxia, unspecified: Secondary | ICD-10-CM | POA: Diagnosis present

## 2020-11-20 DIAGNOSIS — I69351 Hemiplegia and hemiparesis following cerebral infarction affecting right dominant side: Secondary | ICD-10-CM | POA: Insufficient documentation

## 2020-11-20 DIAGNOSIS — R262 Difficulty in walking, not elsewhere classified: Secondary | ICD-10-CM | POA: Insufficient documentation

## 2020-11-20 DIAGNOSIS — M6281 Muscle weakness (generalized): Secondary | ICD-10-CM

## 2020-11-20 DIAGNOSIS — R278 Other lack of coordination: Secondary | ICD-10-CM

## 2020-11-20 DIAGNOSIS — R2681 Unsteadiness on feet: Secondary | ICD-10-CM | POA: Insufficient documentation

## 2020-11-20 DIAGNOSIS — M79601 Pain in right arm: Secondary | ICD-10-CM | POA: Diagnosis present

## 2020-11-20 DIAGNOSIS — R2689 Other abnormalities of gait and mobility: Secondary | ICD-10-CM | POA: Diagnosis present

## 2020-11-20 NOTE — Therapy (Signed)
[facility] [address] [place], [place], [address] Phone: [phone]   Fax:  [phone]  Occupational Therapy Treatment  Patient Details  Name: Phyllistine Domingos MRN: [id number] Date of Birth: 1970/10/28 Referring Provider (OT): Harvel Ricks   Encounter Date: 11/20/2020   OT End of Session - 11/20/20 1800     Visit Number 5    Number of Visits 17    Date for OT Re-Evaluation 01/05/21    Authorization Type BCBS 2022 - [id number]    OT Start Time 1711    OT Stop Time 1745    OT Time Calculation (min) 34 min    Activity Tolerance Patient tolerated treatment well    Behavior During Therapy WFL for tasks assessed/performed             Past Medical History:  Diagnosis Date   Depression    DM2 (diabetes mellitus, type 2) (HCC)    HLD (hyperlipidemia)    HTN (hypertension)     History reviewed. No pertinent surgical history.  There were no vitals filed for this visit.   Subjective Assessment - 11/20/20 1753     Subjective  Patient fell this morning and was unable to get up without assistance    Currently in Pain? No/denies    Pain Score 0-No pain                          OT Treatments/Exercises (OP) - 11/20/20 0001       ADLs   Toileting Patient and fiance report that she is "groggy"in the morning.  TOday she was walking toward bathroom, and she fell forward onto her knees.  SHe was not able to get up without help.  Discussed for safety that she use her walker in the morning when she is first moving about her home.  Patient and fiance agreed.    Writing Addressed handwriting on vertical surface as well as horizontal surface.  Patient with better control, legibility on horizontal surface where weight of arm is supported.  On vertical surface, patient's writing larger, and less controlled.  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  Patient has least ataxia when closed chain or arm supported.   Patient tried various sizes and weights to writing utensils, and did best with Bic Velocity, mod point.      Neurological Re-education Exercises   Other Exercises 1 Neuromuscular reeducation to address RUE. Creating closed chain conditions to load RUE and also for stretch of shoulder capsule.  Transitioning side to tall sit, modified plantgrade, and modified push up.  Patient with improving strength, decreased report of pain, and still fatigues quickly.  Need further proximal strengthening to increase distal control.                      OT Short Term Goals - 11/20/20 1801       OT SHORT TERM GOAL #1   Title Patient will complete HEP designed to improve RUE coordination    Time 4    Period Weeks    Status On-going    Target Date 12/06/20      OT SHORT TERM GOAL #2   Title Patient will complete an HEP designed to improve right grip strength    Time 4    Period Weeks    Status On-going      OT SHORT TERM GOAL #3   Title Patient will demonstrate  improved ability to maintain arm over head sufficient time to wet / rinse hair with hand held shower head    Time 4    Period Weeks    Status Achieved      OT SHORT TERM GOAL #4   Title Patient will self report improved control of vertical motion when brushing teeth    Time 4    Period Weeks    Status On-going      OT SHORT TERM GOAL #5   Title Patient will demonstrate understanding of return to work recommendations    Time 4    Period Weeks    Status Achieved      OT SHORT TERM GOAL #6   Title Patient will complete bathing, dressing, and grooming with modified independence consistently in a timely manner in preparation for return to work.    Time 4    Period Weeks    Status On-going               OT Long Term Goals - 11/20/20 1802       OT LONG TERM GOAL #1   Title Patient will complete updated HEP for RUE strength and coordination    Time 8    Period Weeks    Status On-going      OT LONG TERM GOAL #2    Title Patient will demonstrate at least a10 lb increase in right grip strength to ease opening packages, carrying items, etc.    Baseline 35lb    Time 8    Period Weeks    Status On-going      OT LONG TERM GOAL #3   Title Patient will complete 9 hole peg test in 45 sec or less    Baseline 58 sec    Time 8    Period Weeks    Status On-going      OT LONG TERM GOAL #4   Title Patient will demonstrate improved ability to load/unload dishwasher, place dishes in shoulder height cabinets    Time 8    Period Weeks    Status On-going      OT LONG TERM GOAL #5   Title Patient will demonstrate understanding of return to driving recommendations    Time 8    Period Weeks    Status On-going      OT LONG TERM GOAL #6   Title With her right hand, patient will carry a hot beverage without spilling while walking 15 feet level surface    Time 8    Period Weeks    Status On-going      OT LONG TERM GOAL #7   Title Patient will complete FOTO survey at discharge with d/c score of 75    Baseline FS 64, Risk adjusted 52    Time 8    Period Weeks    Status On-going                   Plan - 11/20/20 1800     Clinical Impression Statement Pt continues to show consistent functional improvement.  Patient is experiencing fatigue now that she has returned to work full time.    OT Occupational Profile and History Detailed Assessment- Review of Records and additional review of physical, cognitive, psychosocial history related to current functional performance    Occupational performance deficits (Please refer to evaluation for details): ADL's;IADL's;Work;Rest and Sleep;Leisure    Body Structure / Function / Physical Skills ADL;Strength;Balance;Dexterity;GMC;Pain;Tone;Body mechanics;UE functional use;Endurance;IADL;Coordination;Mobility;FMC  Rehab Potential Excellent    Clinical Decision Making Several treatment options, min-mod task modification necessary    Comorbidities Affecting  Occupational Performance: May have comorbidities impacting occupational performance    Modification or Assistance to Complete Evaluation  No modification of tasks or assist necessary to complete eval    OT Frequency 2x / week    OT Duration 8 weeks    OT Treatment/Interventions Self-care/ADL training;DME and/or AE instruction;Splinting;Balance training;Aquatic Therapy;Therapeutic activities;Cognitive remediation/compensation;Therapeutic exercise;Neuromuscular education;Functional Mobility Training;Visual/perceptual remediation/compensation;Patient/family education;Manual Therapy;Electrical Stimulation    Plan proximal strengthening - assess RUE shoulder pain. NMR RUE, Postural control, dynamic standing balance    Consulted and Agree with Plan of Care Patient;Family member/caregiver    Family Member Consulted fiance Sean             Patient will benefit from skilled therapeutic intervention in order to improve the following deficits and impairments:   Body Structure / Function / Physical Skills: ADL, Strength, Balance, Dexterity, GMC, Pain, Tone, Body mechanics, UE functional use, Endurance, IADL, Coordination, Mobility, FMC       Visit Diagnosis: Hemiplegia and hemiparesis following cerebral infarction affecting right dominant side (HCC)  Pain in right arm  Muscle weakness (generalized)  Other lack of coordination  Unsteadiness on feet  Ataxia    Problem List Patient Active Problem List   Diagnosis Date Noted   COVID-19 11/07/2020   Left pontine cerebrovascular accident (HCC) 10/01/2020   Hyperglycemia 09/25/2020   Stroke (HCC) 09/25/2020   HTN (hypertension)    DM2 (diabetes mellitus, type 2) (HCC)    HLD (hyperlipidemia)    Depression    Acute ischemic stroke (HCC) 09/24/2020    Collier Salina 11/20/2020, 6:02 PM  [facility] [address] [place], [place], [address] Phone: [phone]   Fax:   [phone]  Name: Keshia Weare MRN: [id number] Date of Birth: 1970/11/27

## 2020-11-20 NOTE — Telephone Encounter (Signed)
Attempted to call patient during her planning time.   Amber Starr, MD  Family Medicine Teaching Service

## 2020-11-25 ENCOUNTER — Other Ambulatory Visit: Payer: Self-pay

## 2020-11-25 ENCOUNTER — Ambulatory Visit: Payer: BC Managed Care – PPO

## 2020-11-25 ENCOUNTER — Ambulatory Visit: Payer: BC Managed Care – PPO | Admitting: Occupational Therapy

## 2020-11-25 ENCOUNTER — Encounter: Payer: Self-pay | Admitting: Occupational Therapy

## 2020-11-25 DIAGNOSIS — M6281 Muscle weakness (generalized): Secondary | ICD-10-CM

## 2020-11-25 DIAGNOSIS — I69351 Hemiplegia and hemiparesis following cerebral infarction affecting right dominant side: Secondary | ICD-10-CM

## 2020-11-25 DIAGNOSIS — M79601 Pain in right arm: Secondary | ICD-10-CM

## 2020-11-25 DIAGNOSIS — R278 Other lack of coordination: Secondary | ICD-10-CM

## 2020-11-25 DIAGNOSIS — R2689 Other abnormalities of gait and mobility: Secondary | ICD-10-CM

## 2020-11-25 DIAGNOSIS — R2681 Unsteadiness on feet: Secondary | ICD-10-CM

## 2020-11-25 DIAGNOSIS — R270 Ataxia, unspecified: Secondary | ICD-10-CM

## 2020-11-25 NOTE — Therapy (Signed)
[facility] [address] [place], [place], [address] Phone: [phone]   Fax:  [phone]  Physical Therapy Treatment  Patient Details  Name: Amber [name] MRN: [id number] Date of Birth: [birth year] Referring Provider (PT): Lauraine Rinne, PA-C   Encounter Date: 11/25/2020   PT End of Session - 11/25/20 1622     Visit Number 5    Number of Visits 17    Date for PT Re-Evaluation 12/18/20    Authorization Type BCBS    PT Start Time 1615    PT Stop Time 1659    PT Time Calculation (min) 44 min    Equipment Utilized During Treatment Gait belt    Activity Tolerance Patient tolerated treatment well    Behavior During Therapy WFL for tasks assessed/performed             Past Medical History:  Diagnosis Date   Depression    DM2 (diabetes mellitus, type 2) ([facility])    HLD (hyperlipidemia)    HTN (hypertension)     History reviewed. No pertinent surgical history.  There were no vitals filed for this visit.   Subjective Assessment - 11/25/20 1622     Subjective Pt reports that she is struggling with making it through the days as is so exhausted half way through. Pt reports she did have 2 falls last week. Was not using walker. [place] forward suddenly on one and sat on bed wrong for second one falling to the right. Pt reports she is also concerned as menstual cycle has not stopped since her stroke. She is going to talk to her doctor about it.    Patient is accompained by: Family member   Fiance   Pertinent History HLD, HTN, DM2, Depression    Limitations Standing;Walking;House hold activities    How long can you walk comfortably? < 5 minutes    Diagnostic tests MRI showed a 10 mm acute infarction in the left pons.  No hemorrhage or mass-effect. Echocardiogram with ejection fraction of 60 to 58% grade 2 diastolic dysfunction    Patient Stated Goals Wants to be Independence, Get Rid of the RW    Currently in Pain?  No/denies    Pain Onset More than a month ago                               [facility] Adult PT Treatment/Exercise - 11/25/20 1628       Transfers   Transfers Sit to Stand;Stand to Sit    Sit to Stand 5: Supervision    Stand to Sit 5: Supervision      Ambulation/Gait   Ambulation/Gait Yes    Ambulation/Gait Assistance 5: Supervision    Ambulation Distance (Feet) 115 Feet    Assistive device Rolling walker    Gait Pattern Step-through pattern;Decreased stance time - right;Decreased hip/knee flexion - right;Decreased step length - left    Ambulation Surface Level;Indoor      Standardized Balance Assessment   Standardized Balance Assessment Berg Balance Test;Timed Up and Go Test      Berg Balance Test   Sit to Stand Able to stand without using hands and stabilize independently    Standing Unsupported Able to stand safely 2 minutes    Sitting with Back Unsupported but Feet Supported on Floor or Stool Able to sit safely and securely 2 minutes    Stand to Sit Sits safely with minimal use of hands  Transfers Able to transfer safely, minor use of hands    Standing Unsupported with Eyes Closed Able to stand 10 seconds safely    Standing Ubsupported with Feet Together Able to place feet together independently and stand for 1 minute with supervision    From Standing, Reach Forward with Outstretched Arm Can reach confidently >25 cm (10")    From Standing Position, Pick up Object from [place] to pick up shoe safely and easily    From Standing Position, Turn to Look Behind Over each Shoulder Looks behind from both sides and weight shifts well    Turn 360 Degrees Able to turn 360 degrees safely but slowly    Standing Unsupported, Alternately Place Feet on Step/Stool Able to complete 4 steps without aid or supervision    Standing Unsupported, One Foot in Front Able to plae foot ahead of the other independently and hold 30 seconds    Standing on One Leg Tries to lift  leg/unable to hold 3 seconds but remains standing independently    Total Score 47      Timed Up and Go Test   TUG Normal TUG    Normal TUG (seconds) 10.81   with RW. Without AD 12 sec but less steady with listing to right.     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 Going to talk to MD about writing some accomodations that would help her. 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 Discussed looking in to a pointer or laser to help with pointing out things on board.      Neuro Re-ed    Neuro Re-ed Details  At counter: marching walk 8' x 4, side stepping 8' x 4 with touching occasionally for support. Pt more challenged with marching during right SLS time. Close SBA/CGA. BP=140/78 after                  Upper Extremity Functional Index Score :   /80   PT Education - 11/25/20 1947     Education Details Results of testing. Energy conservation    Person(s) Educated Patient    Methods Explanation;Demonstration    Comprehension Verbalized understanding              PT Short Term Goals - 11/25/20 1634       PT SHORT TERM GOAL #1   Title Patient will be independent with initial HEP for balance/strength (ALL STGs Due: 11/21/2020)    Baseline 11/25/20 Pt has been busy with back to school and has not been consistent in performance.    Time 4    Period Weeks    Status Partially Met    Target Date 11/21/20      PT SHORT TERM GOAL #2   Title Patient will improve TUG to </= 12 seconds with LRAD to demo reduced fall risk    Baseline 13.13 secs w/ RW. 10.81 sec with RW on 11/25/20    Time 4    Period Weeks    Status Achieved      PT SHORT TERM GOAL #3    Title Patient will undergo 2MWT/6MWT and LTG to be set  Baseline 6MWT was performed and LTG written    Time 4    Period Weeks    Status Achieved      PT SHORT TERM GOAL #4   Title Patient will improve Berg Balance to >/= 47/56 to demo reduced fall risk    Baseline 44/56. 11/25/20 47/56    Time 4    Period Weeks    Status Achieved               PT Long Term Goals - 10/28/20 1840       PT LONG TERM GOAL #1   Title Patient will be independent with final HEP for balance/strength and daily walking program (ALL LTGs Due: 12/19/2020)    Baseline no HEP established    Time 8    Period Weeks    Status New      PT LONG TERM GOAL #2   Title Patient will improve 5x sit <> stand to </= 10 seconds for improved mobiltiy and reduced fall risk    Baseline 13.65 seconds    Time 8    Period Weeks    Status New      PT LONG TERM GOAL #3   Title Patient will improve gait speed w/ LRAD to 2.5 ft/sec to demo improved community mobility    Baseline 2.1 ft/sec    Time 8    Period Weeks    Status New      PT LONG TERM GOAL #4   Title Patient will improve Berg Balance to >/= 50/56 to demo reduced fall risk and improved balance    Baseline 44/56    Time 8    Period Weeks    Status New      PT LONG TERM GOAL #5   Title Pt will increase 6MWT to >600' completing entire 6 minutes for improved gait ability and activity tolerance.    Baseline 10/28/20  345' stopping at 4 min 17 sec    Time 8    Period Weeks    Status New      PT LONG TERM GOAL #6   Title Patient will improve FOTO to >/= 65%    Baseline 55%    Time 8    Period Weeks    Status New                   Plan - 11/25/20 2000     Clinical Impression Statement PT assessed STGs with pt showing good progress meeting all but one of the goals. She decreased her TUG to 10.81 sec with RW. Trialed without walker but less steady with listing to the right at times. Berg score improved to 47/56 indicating decreasing fall risk.  Pt does continue to have weakness in RLE especially noted with increasing SLS time on right. Pt will continue to benefit from skilled PT to continue to progress towards remaining goals.    Personal Factors and Comorbidities Comorbidity 3+    Comorbidities HLD, HTN, DM2, Depression    Examination-Activity Limitations Bed Mobility;Sit;Stairs;Transfers;Locomotion Level;Stand    Examination-Participation Restrictions Occupation;Community Activity;Driving    Stability/Clinical Decision Making Stable/Uncomplicated    Rehab Potential Good    PT Frequency 2x / week    PT Duration 8 weeks    PT Treatment/Interventions ADLs/Self Care Home Management;Aquatic Therapy;Cryotherapy;Electrical Stimulation;Moist Heat;DME Instruction;Balance training;Therapeutic exercise;Therapeutic activities;Functional mobility training;Stair training;Gait training;Neuromuscular re-education;Patient/family education;Orthotic Fit/Training;Vestibular;Passive range of motion;Manual techniques    PT Next Visit Plan Continue with strengthening and standing balance. Focus  on improving right foot clearance and placement. Gait with cane with quad tip. If goes well have pt try to get one for home. Sci Fit for aerobic endurance (performed at level 2.1 x 4 min with arms at 6 and seat at 16 today)    Consulted and Agree with Plan of Care Patient;Family member/caregiver    Family Member Consulted Fiance             Patient will benefit from skilled therapeutic intervention in order to improve the following deficits and impairments:  Abnormal gait, Postural dysfunction, Decreased coordination, Decreased activity tolerance, Decreased endurance, Decreased strength, Difficulty walking, Decreased balance, Decreased knowledge of use of DME  Visit Diagnosis: Other abnormalities of gait and mobility  Muscle weakness (generalized)  Unsteadiness on feet     Problem List Patient Active Problem List   Diagnosis Date Noted   COVID-19  11/07/2020   Left pontine cerebrovascular accident ([facility]) 10/01/2020   Hyperglycemia 09/25/2020   Stroke ([facility]) 09/25/2020   HTN (hypertension)    DM2 (diabetes mellitus, type 2) ([facility])    HLD (hyperlipidemia)    Depression    Acute ischemic stroke ([facility] [place]) 09/24/2020    Electa Sniff, PT, DPT, NCS 11/25/2020, 8:03 PM  [facility] [address] [place], [place], [address] Phone: [phone]   Fax:  [phone]  Name: Devinn Voshell MRN: [id number] Date of Birth: Mar 25, 1970

## 2020-11-25 NOTE — Therapy (Signed)
[facility] [address] [place], [place], [address] Phone: [phone]   Fax:  [phone]  Occupational Therapy Treatment  Patient Details  Name: Amber [name] MRN: [id number] Date of Birth: [birth year] Referring Provider (OT): Harvel Ricks   Encounter Date: 11/25/2020   OT End of Session - 11/25/20 1706     Visit Number 6    Number of Visits 17    Date for OT Re-Evaluation 01/05/21    Authorization Type BCBS 2022 - [id number]    OT Start Time 1700    OT Stop Time 1745    OT Time Calculation (min) 45 min    Activity Tolerance Patient tolerated treatment well    Behavior During Therapy WFL for tasks assessed/performed             Past Medical History:  Diagnosis Date   Depression    DM2 (diabetes mellitus, type 2) ([facility])    HLD (hyperlipidemia)    HTN (hypertension)     History reviewed. No pertinent surgical history.  There were no vitals filed for this visit.   Subjective Assessment - 11/25/20 1701     Subjective  Pt denies any pain. Pt reports significant fatigue with going back to work.    Patient is accompanied by: Family member   fiance   Currently in Pain? No/denies                          OT Treatments/Exercises (OP) - 11/25/20 1737       Exercises   Exercises Work Hardening      Work Hardening Exercises   UBE (Upper Arm Bike) x 6 minutes (foward and backward) level 1 for reciprocal motion, endurance      Neurological Re-education Exercises   Other Exercises 1 Proximal strengthening with RUE for reaching overhead to tap and obtain A-Z on cabinets. Pt with report of fatigue but good reach and endurance.    Other Exercises 2 Proximal strengthening with yellow theraband. See pt instructions.                   OT Short Term Goals - 11/25/20 1729       OT SHORT TERM GOAL #1   Title Patient will complete HEP designed to improve RUE coordination    Time 4    Period  Weeks    Status On-going    Target Date 12/06/20      OT SHORT TERM GOAL #2   Title Patient will complete an HEP designed to improve right grip strength    Time 4    Period Weeks    Status On-going      OT SHORT TERM GOAL #3   Title Patient will demonstrate improved ability to maintain arm over head sufficient time to wet / rinse hair with hand held shower head    Time 4    Period Weeks    Status Achieved      OT SHORT TERM GOAL #4   Title Patient will self report improved control of vertical motion when brushing teeth    Time 4    Period Weeks    Status Achieved      OT SHORT TERM GOAL #5   Title Patient will demonstrate understanding of return to work recommendations    Time 4    Period Weeks    Status Achieved      OT SHORT TERM GOAL #6  Title Patient will complete bathing, dressing, and grooming with modified independence consistently in a timely manner in preparation for return to work.    Time 4    Period Weeks    Status Achieved               OT Long Term Goals - 11/20/20 1802       OT LONG TERM GOAL #1   Title Patient will complete updated HEP for RUE strength and coordination    Time 8    Period Weeks    Status On-going      OT LONG TERM GOAL #2   Title Patient will demonstrate at least a10 lb increase in right grip strength to ease opening packages, carrying items, etc.    Baseline 35lb    Time 8    Period Weeks    Status On-going      OT LONG TERM GOAL #3   Title Patient will complete 9 hole peg test in 45 sec or less    Baseline 58 sec    Time 8    Period Weeks    Status On-going      OT LONG TERM GOAL #4   Title Patient will demonstrate improved ability to load/unload dishwasher, place dishes in shoulder height cabinets    Time 8    Period Weeks    Status On-going      OT LONG TERM GOAL #5   Title Patient will demonstrate understanding of return to driving recommendations    Time 8    Period Weeks    Status On-going      OT  LONG TERM GOAL #6   Title With her right hand, patient will carry a hot beverage without spilling while walking 15 feet level surface    Time 8    Period Weeks    Status On-going      OT LONG TERM GOAL #7   Title Patient will complete FOTO survey at discharge with d/c score of 75    Baseline FS 64, Risk adjusted 52    Time 8    Period Weeks    Status On-going                   Plan - 11/25/20 1740     Clinical Impression Statement Pt progressing towards goals and showing improvements. Pt continues to report fatigue with working full time.    OT Occupational Profile and History Detailed Assessment- Review of Records and additional review of physical, cognitive, psychosocial history related to current functional performance    Occupational performance deficits (Please refer to evaluation for details): ADL's;IADL's;Work;Rest and Sleep;Leisure    Body Structure / Function / Physical Skills ADL;Strength;Balance;Dexterity;GMC;Pain;Tone;Body mechanics;UE functional use;Endurance;IADL;Coordination;Mobility;FMC    Rehab Potential Excellent    Clinical Decision Making Several treatment options, min-mod task modification necessary    Comorbidities Affecting Occupational Performance: May have comorbidities impacting occupational performance    Modification or Assistance to Complete Evaluation  No modification of tasks or assist necessary to complete eval    OT Frequency 2x / week    OT Duration 8 weeks    OT Treatment/Interventions Self-care/ADL training;DME and/or AE instruction;Splinting;Balance training;Aquatic Therapy;Therapeutic activities;Cognitive remediation/compensation;Therapeutic exercise;Neuromuscular education;Functional Mobility Training;Visual/perceptual remediation/compensation;Patient/family education;Manual Therapy;Electrical Stimulation    Plan proximal strengthening - assess RUE shoulder pain. NMR RUE, Postural control, dynamic standing balance    Consulted and Agree  with Plan of Care Patient;Family member/caregiver    Family Member Consulted fiance Gregary Signs  Patient will benefit from skilled therapeutic intervention in order to improve the following deficits and impairments:   Body Structure / Function / Physical Skills: ADL, Strength, Balance, Dexterity, GMC, Pain, Tone, Body mechanics, UE functional use, Endurance, IADL, Coordination, Mobility, FMC       Visit Diagnosis: Hemiplegia and hemiparesis following cerebral infarction affecting right dominant side ([facility])  Pain in right arm  Muscle weakness (generalized)  Other lack of coordination  Unsteadiness on feet  Other abnormalities of gait and mobility  Ataxia    Problem List Patient Active Problem List   Diagnosis Date Noted   COVID-19 11/07/2020   Left pontine cerebrovascular accident ([facility]) 10/01/2020   Hyperglycemia 09/25/2020   Stroke ([facility]) 09/25/2020   HTN (hypertension)    DM2 (diabetes mellitus, type 2) ([facility])    HLD (hyperlipidemia)    Depression    Acute ischemic stroke ([facility]) 09/24/2020    Junious Dresser, OT 11/25/2020, 5:41 PM  [facility] [address] [place], [place], [address] Phone: [phone]   Fax:  [phone]  Name: Amber [name] MRN: [id number] Date of Birth: 11/07/1970

## 2020-11-27 ENCOUNTER — Ambulatory Visit: Payer: BC Managed Care – PPO

## 2020-11-27 ENCOUNTER — Encounter: Payer: Self-pay | Admitting: Family Medicine

## 2020-11-27 ENCOUNTER — Encounter: Payer: Self-pay | Admitting: Occupational Therapy

## 2020-11-27 ENCOUNTER — Ambulatory Visit: Payer: BC Managed Care – PPO | Admitting: Occupational Therapy

## 2020-11-27 ENCOUNTER — Other Ambulatory Visit: Payer: Self-pay

## 2020-11-27 DIAGNOSIS — M6281 Muscle weakness (generalized): Secondary | ICD-10-CM

## 2020-11-27 DIAGNOSIS — I69351 Hemiplegia and hemiparesis following cerebral infarction affecting right dominant side: Secondary | ICD-10-CM

## 2020-11-27 DIAGNOSIS — M79601 Pain in right arm: Secondary | ICD-10-CM

## 2020-11-27 DIAGNOSIS — R2681 Unsteadiness on feet: Secondary | ICD-10-CM

## 2020-11-27 DIAGNOSIS — R262 Difficulty in walking, not elsewhere classified: Secondary | ICD-10-CM

## 2020-11-27 DIAGNOSIS — R278 Other lack of coordination: Secondary | ICD-10-CM

## 2020-11-27 DIAGNOSIS — R2689 Other abnormalities of gait and mobility: Secondary | ICD-10-CM

## 2020-11-27 DIAGNOSIS — R270 Ataxia, unspecified: Secondary | ICD-10-CM

## 2020-11-27 NOTE — Therapy (Signed)
[facility] [address] [place], [place], [address] Phone: [phone]   Fax:  [phone]  Physical Therapy Treatment  Patient Details  Name: Amber [name] MRN: [id number] Date of Birth: [birth year] Referring Provider (PT): Lauraine Rinne, PA-C   Encounter Date: 11/27/2020   PT End of Session - 11/27/20 1701     Visit Number 6    Number of Visits 17    Date for PT Re-Evaluation 12/18/20    Authorization Type BCBS    PT Start Time 1700    PT Stop Time 1744    PT Time Calculation (min) 44 min    Equipment Utilized During Treatment Gait belt    Activity Tolerance Patient tolerated treatment well    Behavior During Therapy WFL for tasks assessed/performed             Past Medical History:  Diagnosis Date   Cerebrovascular disease    Depression    DM2 (diabetes mellitus, type 2) ([place])    HLD (hyperlipidemia)    HTN (hypertension)     History reviewed. No pertinent surgical history.  There were no vitals filed for this visit.   Subjective Assessment - 11/27/20 1702     Subjective Patient reports some fatigue, feels like she has bitten off a little more than she can handle. No falls to report, but has had stumbles.    Patient is accompained by: Family member   Fiance   Pertinent History HLD, HTN, DM2, Depression    Limitations Standing;Walking;House hold activities    How long can you walk comfortably? < 5 minutes    Diagnostic tests MRI showed a 10 mm acute infarction in the left pons.  No hemorrhage or mass-effect. Echocardiogram with ejection fraction of 60 to 27% grade 2 diastolic dysfunction    Patient Stated Goals Wants to be Independence, Get Rid of the RW    Currently in Pain? Yes    Pain Score 4     Pain Location Shoulder    Pain Orientation Right    Pain Descriptors / Indicators Aching;Discomfort    Pain Type Acute pain    Pain Onset More than a month ago                         [facility] Adult PT Treatment/Exercise - 11/27/20 0001       Transfers   Transfers Sit to Stand;Stand to Sit    Sit to Stand 5: Supervision    Stand to Sit 5: Supervision      Ambulation/Gait   Ambulation/Gait Yes    Ambulation/Gait Assistance 4: Min guard;4: Min assist    Ambulation/Gait Assistance Details Continued gait training with use of SPC with quad tip attachment, 2 x 115 ft, plus x 75 ft out of therapy gym at end of session. Intermittent rest breaks required due to fatigue. Patient require initial cues for proper sequencing and positioning of cane as often placing to close to LE and not leaving adequate room for foot. One instance of Min A required to maintain balance    Ambulation Distance (Feet) 115 Feet   x 2, x 75 ft   Assistive device Straight cane   with Quad Tip Attachment   Gait Pattern Step-through pattern;Decreased stance time - right;Decreased hip/knee flexion - right;Decreased step length - left    Ambulation Surface Level;Indoor      Self-Care   Self-Care Other Self-Care Comments    Other Self-Care  Comments  PT discussing importance of sleep regimen to promote improved sleep, as well as continued pacing of activites for reduced fatigue and improved energy conservation.      Neuro Re-ed    Neuro Re-ed Details  Completed forward/backward stepping over black beam in // bars initially with single UE support x 10 reps bilaterally, toward end redued to no UE support with patient able to complete forward but difficulty with posterior. Increased challenge with SLS on RLE noted.      Exercises   Exercises Knee/Hip      Knee/Hip Exercises: Standing   Forward Step Up Both;1 set;10 reps;Hand Hold: 1;Step Height: 6"    Forward Step Up Limitations working on alternating steps forward onto 6" step with single increased challenge with stepping back onto ground leading with LLE due to RLE weakness, PT providing tactile cues for improved muscle  activation/stability.                 Balance Exercises - 11/27/20 0001       Balance Exercises: Standing   SLS with Vectors Solid surface;Limitations;Intermittent upper extremity assist    SLS with Vectors Limitations completed alternating toe taps to 6" step x 10 reps bilaterally, cues for slowed control movement to promote stnace. increased challenge with SLS on RLE.                PT Education - 11/27/20 1754     Education Details See self care    Person(s) Educated Patient    Methods Explanation    Comprehension Verbalized understanding              PT Short Term Goals - 11/25/20 1634       PT SHORT TERM GOAL #1   Title Patient will be independent with initial HEP for balance/strength (ALL STGs Due: 11/21/2020)    Baseline 11/25/20 Pt has been busy with back to school and has not been consistent in performance.    Time 4    Period Weeks    Status Partially Met    Target Date 11/21/20      PT SHORT TERM GOAL #2   Title Patient will improve TUG to </= 12 seconds with LRAD to demo reduced fall risk    Baseline 13.13 secs w/ RW. 10.81 sec with RW on 11/25/20    Time 4    Period Weeks    Status Achieved      PT SHORT TERM GOAL #3   Title Patient will undergo 2MWT/6MWT and LTG to be set    Baseline 6MWT was performed and LTG written    Time 4    Period Weeks    Status Achieved      PT SHORT TERM GOAL #4   Title Patient will improve Berg Balance to >/= 47/56 to demo reduced fall risk    Baseline 44/56. 11/25/20 47/56    Time 4    Period Weeks    Status Achieved               PT Long Term Goals - 10/28/20 1840       PT LONG TERM GOAL #1   Title Patient will be independent with final HEP for balance/strength and daily walking program (ALL LTGs Due: 12/19/2020)    Baseline no HEP established    Time 8    Period Weeks    Status New      PT LONG TERM GOAL #2   Title Patient will  improve 5x sit <> stand to </= 10 seconds for improved mobiltiy  and reduced fall risk    Baseline 13.65 seconds    Time 8    Period Weeks    Status New      PT LONG TERM GOAL #3   Title Patient will improve gait speed w/ LRAD to 2.5 ft/sec to demo improved community mobility    Baseline 2.1 ft/sec    Time 8    Period Weeks    Status New      PT LONG TERM GOAL #4   Title Patient will improve Berg Balance to >/= 50/56 to demo reduced fall risk and improved balance    Baseline 44/56    Time 8    Period Weeks    Status New      PT LONG TERM GOAL #5   Title Pt will increase 6MWT to >600' completing entire 6 minutes for improved gait ability and activity tolerance.    Baseline 10/28/20  345' stopping at 4 min 17 sec    Time 8    Period Weeks    Status New      PT LONG TERM GOAL #6   Title Patient will improve FOTO to >/= 65%    Baseline 55%    Time 8    Period Weeks    Status New                   Plan - 11/27/20 1930     Clinical Impression Statement Continued gait training, trialing a SPC with quad tip. Increased challenge with sequencing require frequent cueing but demo improvements with increased practice. Min A for one instance of imbalance noted. Continued activities promoting RLE strength and balance with focus on step length. WIll continue to progress toward all LTGs.    Personal Factors and Comorbidities Comorbidity 3+    Comorbidities HLD, HTN, DM2, Depression    Examination-Activity Limitations Bed Mobility;Sit;Stairs;Transfers;Locomotion Level;Stand    Examination-Participation Restrictions Occupation;Community Activity;Driving    Stability/Clinical Decision Making Stable/Uncomplicated    Rehab Potential Good    PT Frequency 2x / week    PT Duration 8 weeks    PT Treatment/Interventions ADLs/Self Care Home Management;Aquatic Therapy;Cryotherapy;Electrical Stimulation;Moist Heat;DME Instruction;Balance training;Therapeutic exercise;Therapeutic activities;Functional mobility training;Stair training;Gait  training;Neuromuscular re-education;Patient/family education;Orthotic Fit/Training;Vestibular;Passive range of motion;Manual techniques    PT Next Visit Plan Continue gait with cane with quad tip, will need to continue to practice before cleared for home. COntinue with strengthening and standing balance. Focus on improving right foot clearance and placement. Sci Fit for aerobic endurance (performed at level 2.1 x 4 min with arms at 6 and seat at 16 today)    Consulted and Agree with Plan of Care Patient;Family member/caregiver    Family Member Consulted Fiance             Patient will benefit from skilled therapeutic intervention in order to improve the following deficits and impairments:  Abnormal gait, Postural dysfunction, Decreased coordination, Decreased activity tolerance, Decreased endurance, Decreased strength, Difficulty walking, Decreased balance, Decreased knowledge of use of DME  Visit Diagnosis: Unsteadiness on feet  Other abnormalities of gait and mobility  Difficulty in walking, not elsewhere classified  Muscle weakness (generalized)     Problem List Patient Active Problem List   Diagnosis Date Noted   COVID-19 11/07/2020   Left pontine cerebrovascular accident [facility]) 10/01/2020   Hyperglycemia 09/25/2020   Stroke ([place]) 09/25/2020   HTN (hypertension)    DM2 (diabetes mellitus,  type 2) ([place])    HLD (hyperlipidemia)    Depression    Acute ischemic stroke ([place]) 09/24/2020    Jones Bales, PT, DPT 11/27/2020, 7:32 PM  [facility] [address] [place], [place], [address] Phone: [phone]   Fax:  [phone]  Name: Amber [name] MRN: [id number] Date of Birth: May 19, 1970

## 2020-11-27 NOTE — Therapy (Signed)
[facility] [address] [place], [place], [address] Phone: [phone]   Fax:  [phone]  Occupational Therapy Treatment  Patient Details  Name: Amber [name] MRN: [id number] Date of Birth: Mar 26, 1970 Referring Provider (OT): Harvel Ricks   Encounter Date: 11/27/2020   OT End of Session - 11/27/20 1815     Visit Number 7    Number of Visits 17    Date for OT Re-Evaluation 01/05/21    Authorization Type BCBS 2022 - [id number]    OT Start Time 1617    OT Stop Time 1700    OT Time Calculation (min) 43 min    Activity Tolerance Patient tolerated treatment well    Behavior During Therapy WFL for tasks assessed/performed             Past Medical History:  Diagnosis Date   Cerebrovascular disease    Depression    DM2 (diabetes mellitus, type 2) ([facility])    HLD (hyperlipidemia)    HTN (hypertension)     History reviewed. No pertinent surgical history.  There were no vitals filed for this visit.   Subjective Assessment - 11/27/20 1808     Subjective  My writing is getting better    Patient is accompanied by: Family member    Currently in Pain? No/denies    Pain Score 0-No pain                          OT Treatments/Exercises (OP) - 11/27/20 1808       ADLs   Overall ADLs Long discussion regarding sleep hygiene.  Shutting down TV, Phone, tablet to allow herself to wind down at night.  Discussed importance of sleep for brain health.  Patient is aware of these strategies, but has not made them routine.  Fiance present and supportive of conversation.    Work Patient has been working full time, and is reporting fatigue - mental / physical each session.  She is challenged to work after lunch due to fatigue.  She reports difficulty even organizing thoughts and lesson plans once tired.  Discussed that patient may want to consider reducing workload for a prescribed period of time.  (6-12 weeks) to allow  her to build activity tolerance.  Patient is talking with her MD tomorrow to address work concerns as well as shoulder pain issues.      Neurological Re-education Exercises   Other Exercises 1 Working on distal RUE control and balance of forearm, wrist, and hand.  Patient with optimal control in distal arm when elbow supported on table - partial closed chain.                    OT Education - 11/27/20 1815     Education Details sleep hygiene, modified work schedule    Person(s) Educated Patient;Spouse    Methods Explanation    Comprehension Verbalized understanding              OT Short Term Goals - 11/27/20 1816       OT SHORT TERM GOAL #1   Title Patient will complete HEP designed to improve RUE coordination    Time 4    Period Weeks    Status On-going    Target Date 12/06/20      OT SHORT TERM GOAL #2   Title Patient will complete an HEP designed to improve right grip strength    Time 4  Period Weeks    Status On-going      OT SHORT TERM GOAL #3   Title Patient will demonstrate improved ability to maintain arm over head sufficient time to wet / rinse hair with hand held shower head    Time 4    Period Weeks    Status Achieved      OT SHORT TERM GOAL #4   Title Patient will self report improved control of vertical motion when brushing teeth    Time 4    Period Weeks    Status Achieved      OT SHORT TERM GOAL #5   Title Patient will demonstrate understanding of return to work recommendations    Time 4    Period Weeks    Status Achieved      OT SHORT TERM GOAL #6   Title Patient will complete bathing, dressing, and grooming with modified independence consistently in a timely manner in preparation for return to work.    Time 4    Period Weeks    Status Achieved               OT Long Term Goals - 11/27/20 1816       OT LONG TERM GOAL #1   Title Patient will complete updated HEP for RUE strength and coordination    Time 8    Period  Weeks    Status On-going      OT LONG TERM GOAL #2   Title Patient will demonstrate at least a10 lb increase in right grip strength to ease opening packages, carrying items, etc.    Baseline 35lb    Time 8    Period Weeks    Status On-going      OT LONG TERM GOAL #3   Title Patient will complete 9 hole peg test in 45 sec or less    Baseline 58 sec    Time 8    Period Weeks    Status On-going      OT LONG TERM GOAL #4   Title Patient will demonstrate improved ability to load/unload dishwasher, place dishes in shoulder height cabinets    Time 8    Period Weeks    Status On-going      OT LONG TERM GOAL #5   Title Patient will demonstrate understanding of return to driving recommendations    Time 8    Period Weeks    Status On-going      OT LONG TERM GOAL #6   Title With her right hand, patient will carry a hot beverage without spilling while walking 15 feet level surface    Time 8    Period Weeks    Status On-going      OT LONG TERM GOAL #7   Title Patient will complete FOTO survey at discharge with d/c score of 75    Baseline FS 64, Risk adjusted 52    Time 8    Period Weeks    Status On-going                   Plan - 11/27/20 1815     Clinical Impression Statement Pt progressing towards goals and showing improvements. Pt continues to report fatigue with working full time.    OT Occupational Profile and History Detailed Assessment- Review of Records and additional review of physical, cognitive, psychosocial history related to current functional performance    Occupational performance deficits (Please refer to evaluation for details): ADL's;IADL's;Work;Rest  and Sleep;Leisure    Body Structure / Function / Physical Skills ADL;Strength;Balance;Dexterity;GMC;Pain;Tone;Body mechanics;UE functional use;Endurance;IADL;Coordination;Mobility;FMC    Rehab Potential Excellent    Clinical Decision Making Several treatment options, min-mod task modification necessary     Comorbidities Affecting Occupational Performance: May have comorbidities impacting occupational performance    Modification or Assistance to Complete Evaluation  No modification of tasks or assist necessary to complete eval    OT Frequency 2x / week    OT Duration 8 weeks    OT Treatment/Interventions Self-care/ADL training;DME and/or AE instruction;Splinting;Balance training;Aquatic Therapy;Therapeutic activities;Cognitive remediation/compensation;Therapeutic exercise;Neuromuscular education;Functional Mobility Training;Visual/perceptual remediation/compensation;Patient/family education;Manual Therapy;Electrical Stimulation    Plan proximal strengthening - assess RUE shoulder pain. NMR RUE, Postural control, dynamic standing balance    Consulted and Agree with Plan of Care Patient;Family member/caregiver    Family Member Consulted fiance Sean             Patient will benefit from skilled therapeutic intervention in order to improve the following deficits and impairments:   Body Structure / Function / Physical Skills: ADL, Strength, Balance, Dexterity, GMC, Pain, Tone, Body mechanics, UE functional use, Endurance, IADL, Coordination, Mobility, FMC       Visit Diagnosis: Ataxia  Hemiplegia and hemiparesis following cerebral infarction affecting right dominant side ([facility])  Pain in right arm  Other lack of coordination  Muscle weakness (generalized)  Unsteadiness on feet    Problem List Patient Active Problem List   Diagnosis Date Noted   COVID-19 11/07/2020   Left pontine cerebrovascular accident ([facility]) 10/01/2020   Hyperglycemia 09/25/2020   Stroke ([facility]) 09/25/2020   HTN (hypertension)    DM2 (diabetes mellitus, type 2) ([facility])    HLD (hyperlipidemia)    Depression    Acute ischemic stroke ([facility]) 09/24/2020    Collier Salina, OT/L 11/27/2020, 6:17 PM  [facility] [address] [place], [place],  [address] Phone: [phone]   Fax:  [phone]  Name: Angell Pincock MRN: [id number] Date of Birth: 10-08-1970

## 2020-11-28 ENCOUNTER — Other Ambulatory Visit: Payer: Self-pay

## 2020-11-28 ENCOUNTER — Ambulatory Visit
Admission: RE | Admit: 2020-11-28 | Discharge: 2020-11-28 | Disposition: A | Discharge status: Home / Self Care | Payer: BC Managed Care – PPO | Source: Ambulatory Visit | Attending: Family Medicine | Admitting: Family Medicine

## 2020-11-28 ENCOUNTER — Encounter: Payer: Self-pay | Admitting: Family Medicine

## 2020-11-28 ENCOUNTER — Other Ambulatory Visit (HOSPITAL_COMMUNITY)
Admission: RE | Admit: 2020-11-28 | Discharge: 2020-11-28 | Disposition: A | Discharge status: Home / Self Care | Payer: BC Managed Care – PPO | Source: Ambulatory Visit | Attending: Family Medicine | Admitting: Family Medicine

## 2020-11-28 ENCOUNTER — Other Ambulatory Visit: Payer: Self-pay | Admitting: Family Medicine

## 2020-11-28 ENCOUNTER — Ambulatory Visit (INDEPENDENT_AMBULATORY_CARE_PROVIDER_SITE_OTHER): Payer: BC Managed Care – PPO | Admitting: Family Medicine

## 2020-11-28 VITALS — BP 121/70 | HR 87 | Wt 264.2 lb

## 2020-11-28 DIAGNOSIS — E1159 Type 2 diabetes mellitus with other circulatory complications: Secondary | ICD-10-CM

## 2020-11-28 DIAGNOSIS — M25511 Pain in right shoulder: Secondary | ICD-10-CM

## 2020-11-28 DIAGNOSIS — Z124 Encounter for screening for malignant neoplasm of cervix: Secondary | ICD-10-CM | POA: Diagnosis present

## 2020-11-28 DIAGNOSIS — B372 Candidiasis of skin and nail: Secondary | ICD-10-CM | POA: Diagnosis not present

## 2020-11-28 DIAGNOSIS — G8929 Other chronic pain: Secondary | ICD-10-CM

## 2020-11-28 DIAGNOSIS — I679 Cerebrovascular disease, unspecified: Secondary | ICD-10-CM | POA: Diagnosis not present

## 2020-11-28 DIAGNOSIS — N938 Other specified abnormal uterine and vaginal bleeding: Secondary | ICD-10-CM

## 2020-11-28 DIAGNOSIS — Z23 Encounter for immunization: Secondary | ICD-10-CM | POA: Diagnosis not present

## 2020-11-28 DIAGNOSIS — N939 Abnormal uterine and vaginal bleeding, unspecified: Secondary | ICD-10-CM

## 2020-11-28 DIAGNOSIS — I10 Essential (primary) hypertension: Secondary | ICD-10-CM

## 2020-11-28 DIAGNOSIS — I639 Cerebral infarction, unspecified: Secondary | ICD-10-CM

## 2020-11-28 DIAGNOSIS — E785 Hyperlipidemia, unspecified: Secondary | ICD-10-CM

## 2020-11-28 MED ORDER — SHINGRIX 50 MCG/0.5ML IM SUSR: 0.5000 mL | INTRAMUSCULAR | 0 refills | Status: AC

## 2020-11-28 MED ORDER — SEMAGLUTIDE(0.25 OR 0.5MG/DOS) 2 MG/1.5ML ~~LOC~~ SOPN: 0.2500 mg | PEN_INJECTOR | SUBCUTANEOUS | 3 refills | Status: DC

## 2020-11-28 MED ORDER — NYSTATIN-TRIAMCINOLONE 100000-0.1 UNIT/GM-% EX CREA: 1.0000 "application " | TOPICAL_CREAM | Freq: Two times a day (BID) | CUTANEOUS | 0 refills | Status: DC

## 2020-11-28 MED ORDER — MICONAZOLE NITRATE POWD: 1.0000 "application " | Freq: Two times a day (BID) | 0 refills | Status: DC

## 2020-11-28 MED ORDER — "INSULIN SYRINGE-NEEDLE U-100 31G X 5/16"" 0.3 ML MISC": 0 refills | Status: DC

## 2020-11-28 NOTE — Patient Instructions (Addendum)
It was wonderful to see you today.  Please bring ALL of your medications with you to every visit.   Today we talked about:  --Getting blood work today--I will call you with results  -- An x-ray was ordered for you---you do not need an appointment to have this completed.  I recommend going to [facility] [address] [place] [address] [place]   If the results are normal,I will send you a letter  I will call you with results if anything is abnormal    - We will call you with the time and date of your ultrasound   - I sent in your refills  -Please call Ob Gyn to schedule a visit--they may want to do a biopsy   --Follow up in 1 month    Thank you for choosing [facility].   Please call [phone] with any questions about today's appointment.  Please be sure to schedule follow up at the front  desk before you leave today.   Terisa Starr, MD  Family Medicine

## 2020-11-28 NOTE — Progress Notes (Addendum)
SUBJECTIVE:   CHIEF COMPLAINT: Establish care HPI:   Amber [name] is a 50 y.o. yo female accompanied by her fiance Gregary Signs with history notable for CVA, Hypertension, Type 2 DM, depression presents for establishing care and follow up of her hospitalization for left pontine cerebrovascular accident on 09/22/2020. She was unable to move her right side of the body and fell at home. She did not want to bother anyone, so she waited for 20 hours before deciding to present to the ED on 09/24/20. She was hospitalized at [facility] between 09/24/20 - 10/15/20 with inpatient rehab. She has been adherent to her speech and physical therapy which marked improvements. She then returned to work full-time at a new job working with [employer]	[place]. States that she "kicks the ball too soon" and feels exhausted at the end of the day, which prevents her from wanting to continue her PT/OT sessions.  She is adherent to taking medications. Discussed that her psychiatrist counseled her on sugar addiction and suggested her talking with me about possibility of trying Naltrexone as part of treatment.   She also has these additional acute complaints/concerns:  AUB States that she has had daily uterine bleeding that started during her hospitalization at [facility] and has not stopped since. She has been passing clots up until a few days before today. Yesterday, she was experiencing some spotting. She was having regular monthly menstrual cycles back in 08/2020. There is associated pain.   Has history of PCOS that she received treatments for in her 20s-30s. She has not experienced any symptoms of PCOS since. Last Pap smear was in 2016.  She has an Chief Financial Officer but has not seen her recently. Sexually active with 1 partner.   Right shoulder pain  States that she has increased right shoulder pain. Believes it started after falling from the stroke. She has pain with moving arm with lifting shoulder and moving arm behind her back. No imaging  was performed during the hospitalization. She has not taken any anti-inflammatories including tylenol or voltaren gel.   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 She lived with her mom before, but just moved in with her finance as her elderly mother also had history of strokes and unable to assist her with recovery. Patient is loving her new job, but it is taxing physically.   Denies any history of tobacco, alcohol, or illicit drug use.   OBJECTIVE:   BP 121/70   Pulse 87   Wt 264 lb 3.2 oz (119.8 kg)   SpO2 97%   BMI 41.38 kg/m   Today's weight:  Last Weight  Most recent update: 11/28/2020 10:20 AM  Weight  119.8 kg (264 lb 3.2 oz)             Cardiac: Regular rate and rhythm. Normal S1/S2. No murmurs, rubs, or gallops appreciated. Lungs: Clear bilaterally to ascultation.  Skin + erythematous lesion in R intertriginous area  Psych: Pleasant and appropriate  GU Exam:  Chaperoned exam.  External exam: Normal-appearing female external genitalia with some atrophy + right intertriginous fold in tissue (erythematous, blanching, irritated) Vaginal exam notable for  normal discharge.  Cervix without discharge or obvious lesion.  MSK exam Shoulder full forward flexion Reduced Abduction of R shoulder  Pain  when lifted beyond 90 degrees in abduction 4/5 deltoid and biceps strength on R  4/5 grip strength on R   ASSESSMENT/PLAN:   Dysfunctional uterine bleeding AUB Differential diagnosis for AUB for patient's age group is broad. AUB is most likely due to initiation of DAPT. Other etiologies include  polyps vs ovulatory dysfunction vs fibroids vs malignancy (endometrial more likely > cervical). Last pap was in 2016.  - Pap smear today- cotesting  - F/u cervical cytology results - Referred to OBGYN for possible EMB - Ordered TVUS, nursing will schedule and call patient (radiology closed at time of visit)   DM2 (diabetes mellitus, type 2) (HCC) Follows with Endocrinology. Will discuss discontinuing glipizide in future. Rx semaglutide---discussed CGM, declined.   Left pontine cerebrovascular accident [facility]) On dual anti platelet therapy, high intensity statin. BP at goal on repeat  - Plavix through October  - Direct LDL today - Has PT and Neurology follow up   Right shoulder pain  Patient has full range passive range of motion. Most likely supraspinatus impingement. Discussed trials of conservative management including physical therapy, anti-inflammatories (tylenol) and voltaren gel.  - R shoulder xray ordered to evaluate for arthritis vs frozen shoulder  Social Patient had questions regarding her ability to drive and modifications for her work activities. Still has some deficits in reaction time by PT notes, will await Neurology recommendations.   Healthy living Will have further discussion with patient regarding naltrexone's efficacy for sugar addiction/ appetite control.  Intertrigo refilled miconazole, monitor for recurrence   HCM Needs Hep C, PCV20, COVID booster, foot, and eye exam at follow up Referral for Mammogram and Colonoscopy at follow up  Record request sent     Terisa Starr, MD  Family Medicine Teaching Service  [facility]   Patient seen  along with MS3 student Loung. I personally evaluated this patient along with the student, and verified all aspects of the history, physical exam, and medical decision making as documented by the student. I agree with the student's documentation and have made all necessary edits.

## 2020-11-28 NOTE — Assessment & Plan Note (Signed)
Follows with Endocrinology. Will discuss discontinuing glipizide in future. Rx semaglutide---discussed CGM, declined.

## 2020-11-28 NOTE — Assessment & Plan Note (Addendum)
AUB Differential diagnosis for AUB for patient's age group is broad. AUB is most likely due to initiation of DAPT. Other etiologies include  polyps vs ovulatory dysfunction vs fibroids vs malignancy (endometrial more likely > cervical). Last pap was in 2016.  - Pap smear today- cotesting  - F/u cervical cytology results - Referred to OBGYN for possible EMB - Ordered TVUS, nursing will schedule and call patient (radiology closed at time of visit)

## 2020-11-28 NOTE — Progress Notes (Deleted)
    SUBJECTIVE:   CHIEF COMPLAINT: *** HPI:   Amber [name] is a 50 y.o. yo with history notable for *** presenting for ***.    AUB History  of PCOS    Right shoulder pain  ???? Impingement   Tylenol, Voltaren,   Socially  ?   PERTINENT  PMH / PSH/Family/Social History : ***  OBJECTIVE:   BP (!) 161/75   Pulse 87   Wt 264 lb 3.2 oz (119.8 kg)   SpO2 97%   BMI 41.38 kg/m   Today's weight:  Last Weight  Most recent update: 11/28/2020 10:20 AM    Weight  119.8 kg (264 lb 3.2 oz)            Review of prior weights: Filed Weights   11/28/20 1020  Weight: 264 lb 3.2 oz (119.8 kg)    ***  ASSESSMENT/PLAN:   No problem-specific Assessment & Plan notes found for this encounter.     Naltrexone   {    This will disappear when note is signed, click to select method of visit    :1}  Terisa Starr, MD  Family Medicine Teaching Service  [facility]

## 2020-11-28 NOTE — Assessment & Plan Note (Signed)
CMP Today, continue current therapy

## 2020-11-28 NOTE — Assessment & Plan Note (Signed)
On dual anti platelet therapy, high intensity statin. BP at goal on repeat  - Plavix through October  - Direct LDL today - Has PT and Neurology follow up

## 2020-11-28 NOTE — Assessment & Plan Note (Signed)
Continue statin, direct LDL today

## 2020-11-29 LAB — COMPREHENSIVE METABOLIC PANEL
ALT: 11 IU/L (ref 0–32)
AST: 11 IU/L (ref 0–40)
Albumin/Globulin Ratio: 1.8 (ref 1.2–2.2)
Albumin: 4.4 g/dL (ref 3.8–4.9)
Alkaline Phosphatase: 91 IU/L (ref 44–121)
BUN/Creatinine Ratio: 24 — ABNORMAL HIGH (ref 9–23)
BUN: 14 mg/dL (ref 6–24)
Bilirubin Total: 0.3 mg/dL (ref 0.0–1.2)
CO2: 24 mmol/L (ref 20–29)
Calcium: 9.7 mg/dL (ref 8.7–10.2)
Chloride: 99 mmol/L (ref 96–106)
Creatinine, Ser: 0.59 mg/dL (ref 0.57–1.00)
Globulin, Total: 2.5 g/dL (ref 1.5–4.5)
Glucose: 241 mg/dL — ABNORMAL HIGH (ref 65–99)
Potassium: 4.9 mmol/L (ref 3.5–5.2)
Sodium: 137 mmol/L (ref 134–144)
Total Protein: 6.9 g/dL (ref 6.0–8.5)
eGFR: 108 mL/min/{1.73_m2} (ref >59)

## 2020-11-29 LAB — CBC
Hematocrit: 41 % (ref 34.0–46.6)
Hemoglobin: 13.3 g/dL (ref 11.1–15.9)
MCH: 25.1 pg — ABNORMAL LOW (ref 26.6–33.0)
MCHC: 32.4 g/dL (ref 31.5–35.7)
MCV: 78 fL — ABNORMAL LOW (ref 79–97)
Platelets: 284 10*3/uL (ref 150–450)
RBC: 5.29 x10E6/uL — ABNORMAL HIGH (ref 3.77–5.28)
RDW: 14.8 % (ref 11.7–15.4)
WBC: 8.6 10*3/uL (ref 3.4–10.8)

## 2020-11-29 LAB — LDL CHOLESTEROL, DIRECT: LDL Direct: 166 mg/dL — ABNORMAL HIGH (ref 0–99)

## 2020-12-02 ENCOUNTER — Ambulatory Visit: Payer: BC Managed Care – PPO

## 2020-12-02 ENCOUNTER — Ambulatory Visit: Payer: BC Managed Care – PPO | Admitting: Occupational Therapy

## 2020-12-02 ENCOUNTER — Telehealth: Payer: Self-pay | Admitting: Family Medicine

## 2020-12-02 ENCOUNTER — Other Ambulatory Visit: Payer: Self-pay

## 2020-12-02 DIAGNOSIS — I69351 Hemiplegia and hemiparesis following cerebral infarction affecting right dominant side: Secondary | ICD-10-CM

## 2020-12-02 DIAGNOSIS — R2689 Other abnormalities of gait and mobility: Secondary | ICD-10-CM

## 2020-12-02 DIAGNOSIS — M6281 Muscle weakness (generalized): Secondary | ICD-10-CM

## 2020-12-02 DIAGNOSIS — R262 Difficulty in walking, not elsewhere classified: Secondary | ICD-10-CM

## 2020-12-02 DIAGNOSIS — R2681 Unsteadiness on feet: Secondary | ICD-10-CM

## 2020-12-02 DIAGNOSIS — R278 Other lack of coordination: Secondary | ICD-10-CM

## 2020-12-02 DIAGNOSIS — M79601 Pain in right arm: Secondary | ICD-10-CM

## 2020-12-02 NOTE — Telephone Encounter (Signed)
Attempted to call X1. Left generic voicemail, sent results via MyChart. Will try again 9/14.  Terisa Starr, MD  Family Medicine Teaching Service

## 2020-12-02 NOTE — Therapy (Signed)
[facility] [address] [place], [place], [address] Phone: [phone]   Fax:  [phone]  Physical Therapy Treatment  Patient Details  Name: Amber [name] MRN: [id number] Date of Birth: [birth year] Referring Provider (PT): Lauraine Rinne, PA-C   Encounter Date: 12/02/2020   PT End of Session - 12/02/20 1705     Visit Number 7    Number of Visits 17    Date for PT Re-Evaluation 12/18/20    Authorization Type BCBS    PT Start Time 1658    PT Stop Time 1743    PT Time Calculation (min) 45 min    Equipment Utilized During Treatment Gait belt    Activity Tolerance Patient tolerated treatment well    Behavior During Therapy WFL for tasks assessed/performed             Past Medical History:  Diagnosis Date   Cerebrovascular disease    Depression    DM2 (diabetes mellitus, type 2) ([place])    HLD (hyperlipidemia)    HTN (hypertension)     Past Surgical History:  Procedure Laterality Date   right eye surgery Right    had retinal hemorrhage    There were no vitals filed for this visit.   Subjective Assessment - 12/02/20 1659     Subjective Patient reports no new changes. Would like to go on hold.    Patient is accompained by: Family member   Fiance   Pertinent History HLD, HTN, DM2, Depression    Limitations Standing;Walking;House hold activities    How long can you walk comfortably? < 5 minutes    Diagnostic tests MRI showed a 10 mm acute infarction in the left pons.  No hemorrhage or mass-effect. Echocardiogram with ejection fraction of 60 to 41% grade 2 diastolic dysfunction    Patient Stated Goals Wants to be Independence, Get Rid of the RW    Currently in Pain? Yes    Pain Score 8     Pain Location Shoulder    Pain Orientation Right    Pain Descriptors / Indicators Aching;Discomfort    Pain Type Acute pain    Pain Onset More than a month ago                [facility] Adult PT  Treatment/Exercise - 12/02/20 0001       Transfers   Transfers Sit to Stand;Stand to Sit    Sit to Stand 5: Supervision    Stand to Sit 5: Supervision      Ambulation/Gait   Ambulation/Gait Yes    Ambulation/Gait Assistance 5: Supervision;4: Min guard    Ambulation/Gait Assistance Details Continued gait training with use of SPC with quad tip attachment, 2 x 115 ft. PT educating on purchase options and clearance to begin short distance in house with caregiver/spouse. Intermittent rest breaks required due to fatigue. Patient did not require any assitance or cues with sequencing. PT also provided new tennis balls for eased propulsion.    Ambulation Distance (Feet) 115 Feet   x 2   Assistive device Straight cane   with quad tip attchment   Gait Pattern Step-through pattern;Decreased stance time - right;Decreased hip/knee flexion - right;Decreased step length - left    Ambulation Surface Level;Indoor      Exercises   Exercises Knee/Hip      Knee/Hip Exercises: Aerobic   Other Aerobic Completed Scifit with BLE and LUE only at level 3.5 x 2 minutes, followed by 1 minute rest  break then additional 2 minutes.                 Balance Exercises - 12/02/20 0001       Balance Exercises: Standing   Standing Eyes Opened Narrow base of support (BOS);Head turns;Foam/compliant surface;Limitations    Standing Eyes Opened Limitations standing with narrow BOS and completed EO horizontal/vertical head turns x 10 reps each.    Standing Eyes Closed Narrow base of support (BOS);Wide (BOA);Foam/compliant surface;4 reps;30 secs;Limitations    Standing Eyes Closed Limitations completed EC on airex, 4 x 30 seconds; progressed from wide BOS to narrow BOS.    SLS with Vectors Solid surface;Limitations;Intermittent upper extremity assist    SLS with Vectors Limitations completed alternating toe taps to 6" step x 10 reps bilaterally, cues for slowed control movement to promote stnace. increased challenge  with SLS on RLE. educated on proper completion at home.    Step Ups Forward;6 inch;Intermittent UE support;Limitations    Step Ups Limitations completed forward step ups on 6" step with intermitent UE support x 10 reps alternating.    Sidestepping Foam/compliant support;2 reps;Limitations    Sidestepping Limitations completed x 2 laps down and back on blue beam with intermittent UE support. Cues for slowed and control with movement                  PT Short Term Goals - 11/25/20 1634       PT SHORT TERM GOAL #1   Title Patient will be independent with initial HEP for balance/strength (ALL STGs Due: 11/21/2020)    Baseline 11/25/20 Pt has been busy with back to school and has not been consistent in performance.    Time 4    Period Weeks    Status Partially Met    Target Date 11/21/20      PT SHORT TERM GOAL #2   Title Patient will improve TUG to </= 12 seconds with LRAD to demo reduced fall risk    Baseline 13.13 secs w/ RW. 10.81 sec with RW on 11/25/20    Time 4    Period Weeks    Status Achieved      PT SHORT TERM GOAL #3   Title Patient will undergo 2MWT/6MWT and LTG to be set    Baseline 6MWT was performed and LTG written    Time 4    Period Weeks    Status Achieved      PT SHORT TERM GOAL #4   Title Patient will improve Berg Balance to >/= 47/56 to demo reduced fall risk    Baseline 44/56. 11/25/20 47/56    Time 4    Period Weeks    Status Achieved               PT Long Term Goals - 10/28/20 1840       PT LONG TERM GOAL #1   Title Patient will be independent with final HEP for balance/strength and daily walking program (ALL LTGs Due: 12/19/2020)    Baseline no HEP established    Time 8    Period Weeks    Status New      PT LONG TERM GOAL #2   Title Patient will improve 5x sit <> stand to </= 10 seconds for improved mobiltiy and reduced fall risk    Baseline 13.65 seconds    Time 8    Period Weeks    Status New      PT LONG TERM GOAL #3  Title  Patient will improve gait speed w/ LRAD to 2.5 ft/sec to demo improved community mobility    Baseline 2.1 ft/sec    Time 8    Period Weeks    Status New      PT LONG TERM GOAL #4   Title Patient will improve Berg Balance to >/= 50/56 to demo reduced fall risk and improved balance    Baseline 44/56    Time 8    Period Weeks    Status New      PT LONG TERM GOAL #5   Title Pt will increase 6MWT to >600' completing entire 6 minutes for improved gait ability and activity tolerance.    Baseline 10/28/20  345' stopping at 4 min 17 sec    Time 8    Period Weeks    Status New      PT LONG TERM GOAL #6   Title Patient will improve FOTO to >/= 65%    Baseline 55%    Time 8    Period Weeks    Status New                   Plan - 12/02/20 1749     Clinical Impression Statement Continued gait training with [facility] with PT educating on purhcase options, clearance for short distance only in the home with family member present. Patient will continue to require RW outdoors and long distance ambulaiton due to fatigue. Continued balance activities and working on improved RLE strengthening. Patient will be placed on hold at this time per patient request. Will return to PT services when adjusted to return to work.    Personal Factors and Comorbidities Comorbidity 3+    Comorbidities HLD, HTN, DM2, Depression    Examination-Activity Limitations Bed Mobility;Sit;Stairs;Transfers;Locomotion Level;Stand    Examination-Participation Restrictions Occupation;Community Activity;Driving    Stability/Clinical Decision Making Stable/Uncomplicated    Rehab Potential Good    PT Frequency 2x / week    PT Duration 8 weeks    PT Treatment/Interventions ADLs/Self Care Home Management;Aquatic Therapy;Cryotherapy;Electrical Stimulation;Moist Heat;DME Instruction;Balance training;Therapeutic exercise;Therapeutic activities;Functional mobility training;Stair training;Gait training;Neuromuscular  re-education;Patient/family education;Orthotic Fit/Training;Vestibular;Passive range of motion;Manual techniques    PT Next Visit Plan Continue gait with cane with quad tip, will need to continue to practice before cleared for home. COntinue with strengthening and standing balance. Focus on improving right foot clearance and placement. Sci Fit for aerobic endurance (performed at level 2.1 x 4 min with arms at 6 and seat at 16 today)    Consulted and Agree with Plan of Care Patient;Family member/caregiver    Family Member Consulted Fiance             Patient will benefit from skilled therapeutic intervention in order to improve the following deficits and impairments:  Abnormal gait, Postural dysfunction, Decreased coordination, Decreased activity tolerance, Decreased endurance, Decreased strength, Difficulty walking, Decreased balance, Decreased knowledge of use of DME  Visit Diagnosis: Muscle weakness (generalized)  Unsteadiness on feet  Other abnormalities of gait and mobility  Difficulty in walking, not elsewhere classified     Problem List Patient Active Problem List   Diagnosis Date Noted   Dysfunctional uterine bleeding 11/28/2020   Left pontine cerebrovascular accident ([place]) 10/01/2020   HTN (hypertension)    DM2 (diabetes mellitus, type 2) ([place])    HLD (hyperlipidemia)    Depression     Jones Bales, PT, DPT 12/02/2020, 5:51 PM  [facility] [address]  [place], [place], [address] Phone: [phone]   Fax:  [phone]  Name: Dhani Imel MRN: [id number] Date of Birth: 07/29/1970

## 2020-12-02 NOTE — Therapy (Signed)
[facility] [address] [place], [place], [address] Phone: [phone]   Fax:  [phone]  Occupational Therapy Treatment pt going on hold  Patient Details  Name: Amber [name] MRN: [id number] Date of Birth: September 08, 1970 Referring Provider (OT): Harvel Ricks   Encounter Date: 12/02/2020   OT End of Session - 12/02/20 1632     Visit Number 8    Number of Visits 17    Date for OT Re-Evaluation 01/05/21    Authorization Type BCBS 2022 - [id number]    OT Start Time 1615    OT Stop Time 1700    OT Time Calculation (min) 45 min    Activity Tolerance Patient tolerated treatment well    Behavior During Therapy [facility] for tasks assessed/performed             Past Medical History:  Diagnosis Date   Cerebrovascular disease    Depression    DM2 (diabetes mellitus, type 2) (HCC)    HLD (hyperlipidemia)    HTN (hypertension)     Past Surgical History:  Procedure Laterality Date   right eye surgery Right    had retinal hemorrhage    There were no vitals filed for this visit.   Subjective Assessment - 12/02/20 1618     Subjective  "Had my shoulder x ray and i have arthritis. I now have a note from my doctor to have a break every 2 hours. I am now tying my shoes"    Patient is accompanied by: Family member             Reviewed goals  Resistance Clothespins 1-8# with RUE for increasing and pinch and sustained pinch (i.e. use of binder clips reported to be hard)  Small Peg Board with RUE with good coordination.  Pt is going on hold at this time for no more than 30 days to get used to and acquainted with new work schedule. Pt experiencing significant fatigue at this time that is interfering with progress with therapy. Pt will contact this office ot resume therapy in no more than 30 days. If no contact in 30 days, OT will do a non visit discharge and patient will need to obtain new referral for OT from MD. Pt and  spouse verbalized understanding.                        OT Short Term Goals - 12/02/20 1636       OT SHORT TERM GOAL #1   Title Patient will complete HEP designed to improve RUE coordination    Time 4    Period Weeks    Status Achieved    Target Date 12/06/20      OT SHORT TERM GOAL #2   Title Patient will complete an HEP designed to improve right grip strength    Time 4    Period Weeks    Status Achieved   red theraputty - demonstrated independent     OT SHORT TERM GOAL #3   Title Patient will demonstrate improved ability to maintain arm over head sufficient time to wet / rinse hair with hand held shower head    Time 4    Period Weeks    Status Achieved      OT SHORT TERM GOAL #4   Title Patient will self report improved control of vertical motion when brushing teeth    Time 4    Period Weeks  Status Achieved      OT SHORT TERM GOAL #5   Title Patient will demonstrate understanding of return to work recommendations    Time 4    Period Weeks    Status Achieved   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    Time 4    Period Weeks    Status Achieved               OT Long Term Goals - 12/02/20 1639       OT LONG TERM GOAL #1   Title Patient will complete updated HEP for RUE strength and coordination    Time 8    Period Weeks    Status On-going      OT LONG TERM GOAL #2   Title Patient will demonstrate at least a10 lb increase in right grip strength to ease opening packages, carrying items, etc.    Baseline 35lb    Time 8    Period Weeks    Status On-going   35 lbs RUE 12/02/20     OT LONG TERM GOAL #3   Title Patient will complete 9 hole peg test in 45 sec or less    Baseline 58 sec    Time 8    Period Weeks    Status  On-going      OT LONG TERM GOAL #4   Title Patient will demonstrate improved ability to load/unload dishwasher, place dishes in shoulder height cabinets    Time 8    Period Weeks    Status On-going   patient is manually washing dishes     OT LONG TERM GOAL #5   Title Patient will demonstrate understanding of return to driving recommendations    Time 8    Period Weeks    Status On-going   pt is seeing neurologist 9/21 to review return to driving instructions/recommendations     OT LONG TERM GOAL #6   Title With her right hand, patient will carry a hot beverage without spilling while walking 15 feet level surface    Time 8    Period Weeks    Status On-going      OT LONG TERM GOAL #7   Title Patient will complete FOTO survey at discharge with d/c score of 75    Baseline FS 64, Risk adjusted 52    Time 8    Period Weeks    Status On-going                   Plan - 12/02/20 1650     Clinical Impression Statement Pt is going on hold at this time for no more than 30 days to get used to and acquainted with new work schedule. Pt experiencing significant fatigue at this time that is interfering with progress with therapy. Pt will contact this office ot resume therapy in no more than 30 days. If no contact in 30 days, OT will do a non visit discharge and patient will need to obtain new referral for OT from MD. Pt and spouse verbalized understanding.    OT Occupational Profile and History Detailed Assessment- Review of Records and additional review of physical, cognitive, psychosocial history related to current functional performance    Occupational performance deficits (Please  refer to evaluation for details): ADL's;IADL's;Work;Rest and Sleep;Leisure    Body Structure / Function / Physical Skills ADL;Strength;Balance;Dexterity;GMC;Pain;Tone;Body mechanics;UE functional use;Endurance;IADL;Coordination;Mobility;FMC    Rehab Potential Excellent    Clinical Decision Making Several  treatment options, min-mod task modification necessary    Comorbidities Affecting Occupational Performance: May have comorbidities impacting occupational performance    Modification or Assistance to Complete Evaluation  No modification of tasks or assist necessary to complete eval    OT Frequency 2x / week    OT Duration 8 weeks    OT Treatment/Interventions Self-care/ADL training;DME and/or AE instruction;Splinting;Balance training;Aquatic Therapy;Therapeutic activities;Cognitive remediation/compensation;Therapeutic exercise;Neuromuscular education;Functional Mobility Training;Visual/perceptual remediation/compensation;Patient/family education;Manual Therapy;Electrical Stimulation    Plan going on hold from OT/PT at this time 12/02/20 - if pt does not contact this clinic in 30 days (01/01/21) will complete non visit discharge and patient will obtain new referral if need to return to therapy.    Consulted and Agree with Plan of Care Patient;Family member/caregiver    Family Member Consulted fiance Sean             Patient will benefit from skilled therapeutic intervention in order to improve the following deficits and impairments:   Body Structure / Function / Physical Skills: ADL, Strength, Balance, Dexterity, GMC, Pain, Tone, Body mechanics, UE functional use, Endurance, IADL, Coordination, Mobility, FMC       Visit Diagnosis: Hemiplegia and hemiparesis following cerebral infarction affecting right dominant side (HCC)  Pain in right arm  Muscle weakness (generalized)  Unsteadiness on feet  Other lack of coordination    Problem List Patient Active Problem List   Diagnosis Date Noted   Dysfunctional uterine bleeding 11/28/2020   Left pontine cerebrovascular accident (HCC) 10/01/2020   HTN (hypertension)    DM2 (diabetes mellitus, type 2) (HCC)    HLD (hyperlipidemia)    Depression     Junious Dresser, OT/L 12/02/2020, 4:59 PM  [facility] [address] [place], [place], [address] Phone: [phone]   Fax:  [phone]  Name: Clema Skousen MRN: [id number] Date of Birth: 1970/07/16

## 2020-12-03 LAB — CYTOLOGY - PAP
Comment: NEGATIVE
Diagnosis: NEGATIVE
Diagnosis: REACTIVE
High risk HPV: NEGATIVE

## 2020-12-04 ENCOUNTER — Ambulatory Visit: Payer: BC Managed Care – PPO

## 2020-12-04 ENCOUNTER — Ambulatory Visit: Payer: BC Managed Care – PPO | Admitting: Occupational Therapy

## 2020-12-04 ENCOUNTER — Telehealth: Payer: Self-pay | Admitting: Family Medicine

## 2020-12-04 DIAGNOSIS — E785 Hyperlipidemia, unspecified: Secondary | ICD-10-CM

## 2020-12-04 DIAGNOSIS — G8929 Other chronic pain: Secondary | ICD-10-CM

## 2020-12-04 MED ORDER — ATORVASTATIN CALCIUM 80 MG PO TABS: 80.0000 mg | ORAL_TABLET | Freq: Every day | ORAL | 3 refills | Status: DC

## 2020-12-04 NOTE — Telephone Encounter (Signed)
Called patient to discuss results.  We discussed normal Pap.  Repeat in 5 years.  The patient has had about a year of chronic right shoulder pain.  Differential includes rotator cuff tear versus frozen shoulder.  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  Referral to sports medicine placed.  Discussed elevated cholesterol.  She has been taking her statin 40 mg nightly.  Increase to 80 mg.  Repeat direct LDL at follow-up.  If not at goal consider addition of ezetimibe versus referral to lipid clinic  All questions answered

## 2020-12-09 ENCOUNTER — Ambulatory Visit: Payer: BC Managed Care – PPO | Admitting: Occupational Therapy

## 2020-12-09 ENCOUNTER — Ambulatory Visit: Payer: BC Managed Care – PPO

## 2020-12-10 ENCOUNTER — Ambulatory Visit: Payer: BC Managed Care – PPO | Admitting: Adult Health

## 2020-12-10 ENCOUNTER — Encounter: Payer: Self-pay | Admitting: Adult Health

## 2020-12-10 VITALS — BP 136/86 | HR 86 | Ht 67.0 in | Wt 266.0 lb

## 2020-12-10 DIAGNOSIS — I639 Cerebral infarction, unspecified: Secondary | ICD-10-CM

## 2020-12-10 DIAGNOSIS — E1165 Type 2 diabetes mellitus with hyperglycemia: Secondary | ICD-10-CM

## 2020-12-10 DIAGNOSIS — I10 Essential (primary) hypertension: Secondary | ICD-10-CM | POA: Diagnosis not present

## 2020-12-10 DIAGNOSIS — E785 Hyperlipidemia, unspecified: Secondary | ICD-10-CM | POA: Diagnosis not present

## 2020-12-10 NOTE — Patient Instructions (Signed)
Continue working with therapies for likely ongoing recovery  Continue aspirin 81 mg daily and clopidogrel 75 mg daily  and atorvastatin 80mg  daily  for secondary stroke prevention  Continue plavix for total of 3 months then stop  Continue to follow up with PCP/endocrinology regarding cholesterol, diabetes and blood pressure management  Maintain strict control of hypertension with blood pressure goal below 130/90, diabetes with hemoglobin A1c goal below 7% and cholesterol with LDL cholesterol (bad cholesterol) goal below 70 mg/dL.       Followup in the future with me in 3 months or call earlier if needed       Thank you for coming to see at [facility]. I hope we have been able to provide you high quality care today.  You may receive a patient satisfaction survey over the next few weeks. We would appreciate your feedback and comments so that we may continue to improve ourselves and the health of our patients.

## 2020-12-10 NOTE — Progress Notes (Signed)
[facility] [address] [place]. [place] [address] [phone]       HOSPITAL FOLLOW UP NOTE  [name] Date of Birth:  1970-05-05 Medical Record Number:  [id number]   Reason for Referral:  hospital stroke follow up    SUBJECTIVE:   CHIEF COMPLAINT:  Chief Complaint  Patient presents with   Follow-up    RM 3 with spouse shawn   Pt is well, Physically she is having trouble walking and keeping up her pace. Gets fatigued easily.      HPI:   [name] is a 50 y.o. female with history of hypertension, DM and depression who presented on 09/24/2020 with right sided weakness.  Stroke work-up revealed acute left pontine stroke likely from symptomatic moderate distal basilar stenosis.  Imaging also showed evidence of old left thalamic infarct.  EF 60 to 65%.  LDL 107.  A1c 11.7.  Recommended DAPT for 3 weeks and aspirin alone as well as initiating atorvastatin 40 mg daily.  HTN stable.  Therapy eval's recommended CIR for residual right-sided weakness  Today, 12/10/2020, [name] is being seen for hospital follow up accompanied by her fianc. Reports residual gait impairment with unsteadiness and right sided weakness (RLE gets worse with increased fatigue). Use of RW outdoors - will use a cane indoors - no recent falls. She fatigues quickly after little activity. 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  She is currently taking a 2-week break from therapy because she was having difficulty adequately participating in sessions with excessive fatigue after working.  She is used to being constantly on the go so this new limitation is very frustrating.  She was previously working 3 jobs and has only been able to return back to 1 job - she does not plan on returning back to 3 jobs.  No cognitive deficits.  She questions possibility of returning back to driving.  She also reports hiccups  after eating, occasional swallowing difficulties with hard or chewy foods (denies coughing or aspiration) and urge incontinence.  She does endorse overall she has been making gradual recovery.  Compliant on aspirin and Plavix as well as atorvastatin without side effects.  PCP recently increased atorvastatin from 40 mg to 80 mg daily with recent direct LDL 166.  Blood pressure today 136/86.  Routinely monitors glucose levels at home and routinely followed by endocrinology.  No further concerns at this time.      PERTINENT IMAGING   MR BRAIN WO CONTRAST Result Date: 09/24/2020 IMPRESSION: 10 mm acute infarct of the left pons. No hemorrhage or mass effect. Electronically Signed   By: Deatra Robinson M.D.   On: 09/24/2020 23:00    CT HEAD CODE STROKE WO CONTRAST Result Date: 09/24/2020 MPRESSION: Abnormal hypoattenuation within the left pons and left thalamus, concerning for age indeterminate infarcts that could be acute/subacute.    CT ANGIO HEAD NECK W WO CM (CODE STROKE) Result Date: 09/24/2020 IMPRESSION: 1. No emergent large vessel occlusion. 2. 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 3. Cervical carotid atherosclerosis without stenosis.     ROS:   14 system review of systems performed and negative with exception of those listed in HPI  PMH:  Past Medical History:  Diagnosis Date   Cerebrovascular disease    Depression    DM2 (diabetes mellitus, type 2) (HCC)    HLD (hyperlipidemia)  HTN (hypertension)     PSH:  Past Surgical History:  Procedure Laterality Date   right eye surgery Right    had retinal hemorrhage    Social History:  Social History   Socioeconomic History   Marital status: Divorced    Spouse name: Not on file   Number of children: Not on file   Years of education: Not on file   Highest education level: Not on file  Occupational History   Not on file  Tobacco Use   Smoking status: Never    Smokeless tobacco: Never  Vaping Use   Vaping Use: Former  Substance and Sexual Activity   Alcohol use: Never   Drug use: Never   Sexual activity: Not Currently  Other Topics Concern   Not on file  Social History Narrative   Works as Runner, broadcasting/film/video   Mother was Midwife   In committed relationship       Transport planner    CNA       Presenter, broadcasting at [employer]    Social Determinants of Corporate investment banker Strain: Not on file  Food Insecurity: Not on file  Transportation Needs: Not on file  Physical Activity: Not on file  Stress: Not on file  Social Connections: Not on file  Intimate Partner Violence: Not on file    Family History:  Family History  Problem Relation Age of Onset   Stroke Mother    Hypertension Mother    Diabetes Father    Alcohol abuse Father    Heart attack Father    Hypertension Father    Bipolar disorder Maternal Uncle     Medications:   Current Outpatient Medications on File Prior to Visit  Medication Sig Dispense Refill   acetaminophen (TYLENOL) 325 MG tablet Take 2 tablets (650 mg total) by mouth every 6 (six) hours as needed for mild pain (or Fever >/= 101). 20 tablet 0   aspirin EC 81 MG EC tablet Take 1 tablet (81 mg total) by mouth daily. Swallow whole. 30 tablet 11   atorvastatin (LIPITOR) 80 MG tablet Take 1 tablet (80 mg total) by mouth at bedtime. 90 tablet 3   clopidogrel (PLAVIX) 75 MG tablet Take 1 tablet (75 mg total) by mouth daily. 30 tablet 2   cyclobenzaprine (FLEXERIL) 5 MG tablet Take 1 tablet (5 mg total) by mouth 3 (three) times daily as needed for muscle spasms. 30 tablet 0   glipiZIDE (GLUCOTROL) 5 MG tablet Take 1 tablet (5 mg total) by mouth daily before breakfast. 30 tablet 0   insulin aspart (NOVOLOG FLEXPEN) 100 UNIT/ML FlexPen Inject 10 Units into the skin 3 (three) times daily with meals. 15 mL 11   insulin aspart (NOVOLOG) 100 UNIT/ML injection Inject into the skin.     Insulin Glargine (BASAGLAR  KWIKPEN) 100 UNIT/ML Inject 70 Units into the skin daily. 15 mL 3   Insulin Pen Needle (BD ULTRA-FINE PEN NEEDLES) 29G X 12.7MM MISC      Insulin Syringe-Needle U-100 (B-D INS SYR ULTRAFINE .3CC/31G) 31G X 5/16" 0.3 ML MISC Use as needed for injection 100 each 0   lisinopril-hydrochlorothiazide (ZESTORETIC) 20-12.5 MG tablet Take 1 tablet by mouth daily. 30 tablet 0   miconazole nitrate (MICATIN) POWD Apply 1 application topically 2 (two) times daily. 100 g 0   nystatin-triamcinolone (MYCOLOG II) cream Apply 1 application topically 2 (two) times daily. 240 g 0   Semaglutide,0.25 or 0.5MG /DOS, 2 MG/1.5ML SOPN Inject  0.25 mg into the skin once a week. 0.25 mg once weekly for 4 weeks then increase to 0.5 mg weekly for at least 4 weeks,max 1 mg 3 mL 3   sertraline (ZOLOFT) 100 MG tablet Take 1 tablet (100 mg total) by mouth daily. 30 tablet 0   traMADol (ULTRAM) 50 MG tablet Take 50-100 mg by mouth 3 (three) times daily as needed.     Zoster Vaccine Adjuvanted [facility]) injection Inject 0.5 mLs into the muscle every 2 (two) months for 2 doses. 1 mL 0   No current facility-administered medications on file prior to visit.    Allergies:   Allergies  Allergen Reactions   Alpha-Gal Anaphylaxis   Metformin     Other reaction(s): OTHER Other reaction(s): OTHER    Penicillins       OBJECTIVE:  Physical Exam  Vitals:   12/10/20 1502  BP: 136/86  Pulse: 86  Weight: 266 lb (120.7 kg)  Height: 5\' 7"  (1.702 m)   Body mass index is 41.66 kg/m. No results found.  Post stroke PHQ 2/9 Depression screen PHQ 2/9 11/28/2020  Decreased Interest 0  Down, Depressed, Hopeless 0  PHQ - 2 Score 0  Altered sleeping 2  Tired, decreased energy 3  Change in appetite 0  Feeling bad or failure about yourself  1  Trouble concentrating 1  Moving slowly or fidgety/restless 1  Suicidal thoughts 0  PHQ-9 Score 8  Difficult doing work/chores Extremely dIfficult     General: Obese very pleasant  middle-age Caucasian female, seated, in no evident distress Head: head normocephalic and atraumatic.   Neck: supple with no carotid or supraclavicular bruits Cardiovascular: regular rate and rhythm, no murmurs Musculoskeletal: no deformity Skin:  no rash/petichiae Vascular:  Normal pulses all extremities   Neurologic Exam Mental Status: Awake and fully alert.  Fluent speech and language.  Oriented to place and time. Recent and remote memory intact. Attention span, concentration and fund of knowledge appropriate. Mood and affect appropriate.  Cranial Nerves: Fundoscopic exam reveals sharp disc margins. Pupils equal, briskly reactive to light. Extraocular movements full without nystagmus.  Chronic right eye mild abduction palsy.  Visual fields full to confrontation. Hearing intact. Facial sensation intact.  Mild right lower facial weakness.  Tongue, palate moves normally and symmetrically.  Motor: Normal strength, bulk and tone left upper and lower extremity.  RUE: 4+/5 with positive pronator drift; RLE: 5/5 Sensory.: intact to touch , pinprick , position and vibratory sensation.  Coordination: Rapid alternating movements normal in all extremities except slight decreased right hand dexterity. Finger-to-nose and heel-to-shin mild right-sided ataxia. Gait and Station: Arises from chair without difficulty. Stance is normal. Gait demonstrates slight decreased RLE stride length and step height with use of rolling walker.  Tandem walk and heel toe not attempted.  Reflexes: 1+ and symmetric. Toes downgoing.      NIHSS  3 Modified Rankin  2      ASSESSMENT: Jailen Lung is a 50 y.o. year old female with left pontine stroke likely from symptomatic moderate distal BA stenosis on 09/24/2020 presenting with right-sided weakness. Vascular risk factors include HTN, HLD, DM, remote left thalamic stroke on imaging and obesity.      PLAN:  Left pontine stroke:  Residual deficit: Right-sided ataxia  and gait impairment.  Subjective complaints of occasional swallowing difficulty (more so pockets food on right side - denies aspiration, coughing or choking), urge incontinence, hiccups after eating, excessive fatigue and activity intolerance.  Long discussion regarding residual  deficits and typical recovery time and importance of working with therapies. May consider swallowing study in the future if needed.  May consider referral to urology in the future if needed.  continue aspirin 81 mg daily and clopidogrel 75 mg daily for total of 3 months then aspirin alone and atorvastatin 80 mg daily for secondary stroke prevention.   Discussed secondary stroke prevention measures and importance of close PCP follow up for aggressive stroke risk factor management. I have gone over the pathophysiology of stroke, warning signs and symptoms, risk factors and their management in some detail with instructions to go to the closest emergency room for symptoms of concern. HTN: BP goal <130/90.  Stable on current regimen per PCP HLD: LDL goal <70. Recent direct LDL 166 - PCP increased atorvastatin from 40 mg to 80 mg.  DMII: A1c goal<7.0. Recent A1c 11.7.  On glipizide, and insulin (aspart and glargine) per endo    Follow up in 3 months or call earlier if needed   CC:  [facility] provider: Dr. Pearlean Brownie PCP: Westley Chandler, MD    I spent 56 minutes of face-to-face and non-face-to-face time with patient and fianc.  This included previsit chart review including review of recent hospitalization, lab review, study review, order entry, electronic health record documentation, patient and fianc education and discussion regarding recent stroke including etiology, secondary stroke prevention measures and importance of managing stroke risk factors, residual deficits and typical recovery time and answered all other questions to patient and fianc's satisfaction   Ihor Austin, AGNP-BC  [facility] [address] [place], [place] [address]  Phone [phone] Fax [phone] Note: This document was prepared with digital dictation and possible smart phrase technology. Any transcriptional errors that result from this process are unintentional.

## 2020-12-11 ENCOUNTER — Encounter: Payer: BC Managed Care – PPO | Admitting: Occupational Therapy

## 2020-12-11 ENCOUNTER — Ambulatory Visit: Payer: BC Managed Care – PPO

## 2020-12-11 ENCOUNTER — Encounter: Payer: Self-pay | Admitting: Family Medicine

## 2020-12-11 NOTE — Progress Notes (Signed)
I agree with the above plan 

## 2020-12-16 ENCOUNTER — Ambulatory Visit: Payer: BC Managed Care – PPO

## 2020-12-16 ENCOUNTER — Ambulatory Visit: Payer: BC Managed Care – PPO | Admitting: Occupational Therapy

## 2020-12-17 ENCOUNTER — Other Ambulatory Visit: Payer: Self-pay | Admitting: Physician Assistant

## 2020-12-17 DIAGNOSIS — B372 Candidiasis of skin and nail: Secondary | ICD-10-CM

## 2020-12-18 ENCOUNTER — Ambulatory Visit: Payer: BC Managed Care – PPO

## 2020-12-18 ENCOUNTER — Encounter: Payer: BC Managed Care – PPO | Admitting: Occupational Therapy

## 2020-12-18 ENCOUNTER — Other Ambulatory Visit: Payer: Self-pay

## 2020-12-18 ENCOUNTER — Encounter: Payer: Self-pay | Admitting: Occupational Therapy

## 2020-12-18 ENCOUNTER — Ambulatory Visit: Payer: BC Managed Care – PPO | Admitting: Occupational Therapy

## 2020-12-18 DIAGNOSIS — M79601 Pain in right arm: Secondary | ICD-10-CM

## 2020-12-18 DIAGNOSIS — I69351 Hemiplegia and hemiparesis following cerebral infarction affecting right dominant side: Secondary | ICD-10-CM

## 2020-12-18 DIAGNOSIS — R270 Ataxia, unspecified: Secondary | ICD-10-CM

## 2020-12-18 DIAGNOSIS — R278 Other lack of coordination: Secondary | ICD-10-CM

## 2020-12-18 DIAGNOSIS — M6281 Muscle weakness (generalized): Secondary | ICD-10-CM

## 2020-12-18 DIAGNOSIS — R2681 Unsteadiness on feet: Secondary | ICD-10-CM

## 2020-12-18 NOTE — Therapy (Signed)
[facility] [address] [place], [place], [address] Phone: [phone]   Fax:  [phone]  Occupational Therapy Treatment and Progress Update  Patient Details  Name: Amber [name] MRN: [id number] Date of Birth: 09/12/1970 Referring Provider (OT): Harvel Ricks  This progress report covers dates of service from 10/22/20- 12/18/20.   Patient had taken a brief two week hold from therapy after returning to work, and has opted to retunr to address remaining goals at modified frequency of 1x/week.    Encounter Date: 12/18/2020   OT End of Session - 12/18/20 1759     Visit Number 10    Number of Visits 17    Date for OT Re-Evaluation 02/16/21    Authorization Type BCBS 2022 - [place]    OT Start Time 1700    OT Stop Time 1745    OT Time Calculation (min) 45 min    Activity Tolerance Patient tolerated treatment well    Behavior During Therapy WFL for tasks assessed/performed             Past Medical History:  Diagnosis Date   Cerebrovascular disease    Depression    DM2 (diabetes mellitus, type 2) (HCC)    HLD (hyperlipidemia)    HTN (hypertension)     Past Surgical History:  Procedure Laterality Date   right eye surgery Right    had retinal hemorrhage    There were no vitals filed for this visit.   Subjective Assessment - 12/18/20 1710     Subjective  I have Voltaren gel for my shoulder- just started    Patient is accompanied by: Family member    Currently in Pain? Yes    Pain Score 2     Pain Location Shoulder    Pain Orientation Right    Pain Descriptors / Indicators Aching;Discomfort    Pain Type Acute pain    Pain Onset More than a month ago    Pain Frequency Intermittent                          OT Treatments/Exercises (OP) - 12/18/20 0001       ADLs   Bathing Patient reports improved independence with showering.  Finace helps to bring clothing and towels up from basement as  patient is not yet able to effectively manage stairs.    Writing Patient reports improved ability to write at work, e.g. 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      Neurological Re-education Exercises   Other Exercises 1 Working on postural control and weight shifts through various transitional movements.  Worked on weight shifting left and right, unweightiing one leg (one legged standing) with improved postural activation for balance.  Worked on flexion / rotational movements to ease functional movements of ADL/IADL.                    OT Education - 12/18/20 1757     Education Details potential benefit of aquatic therapy    Person(s) Educated Patient;Spouse    Methods Explanation    Comprehension Verbalized understanding              OT Short Term Goals - 12/18/20 1803       OT SHORT TERM GOAL #1   Title Patient will complete HEP designed to improve RUE coordination    Time 4    Period Weeks    Status Achieved    Target  Date 12/06/20      OT SHORT TERM GOAL #2   Title Patient will complete an HEP designed to improve right grip strength    Time 4    Period Weeks    Status Achieved   red theraputty - demonstrated independent     OT SHORT TERM GOAL #3   Title Patient will demonstrate improved ability to maintain arm over head sufficient time to wet / rinse hair with hand held shower head    Time 4    Period Weeks    Status Achieved      OT SHORT TERM GOAL #4   Title Patient will self report improved control of vertical motion when brushing teeth    Time 4    Period Weeks    Status Achieved      OT SHORT TERM GOAL #5   Title Patient will demonstrate understanding of return to work recommendations    Time 4    Period Weeks    Status Achieved   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    Time 4    Period Weeks    Status Achieved               OT Long Term Goals - 12/18/20 1803       OT LONG TERM GOAL #1   Title Patient will complete updated HEP for RUE strength and coordination    Time 8    Period Weeks    Status On-going    Target Date 02/16/21      OT LONG TERM GOAL #2   Title Patient will demonstrate at least a10 lb increase in right grip strength to ease opening packages, carrying items, etc.    Baseline 35lb    Time 8    Period Weeks    Status On-going   35 lbs RUE 12/02/20   Target Date 02/16/21      OT LONG TERM GOAL #3   Title Patient will complete 9 hole peg test in 45 sec or less    Baseline 58 sec    Time 8    Period Weeks    Status On-going    Target Date 02/16/21      OT LONG TERM GOAL #4   Title Patient will demonstrate improved ability to load/unload dishwasher, place dishes in shoulder height cabinets    Time 8    Period Weeks    Status On-going   patient is manually washing dishes   Target Date 02/16/21      OT LONG TERM GOAL #5   Title Patient will demonstrate understanding of return to driving recommendations    Time 8    Period Weeks    Status On-going   pt is seeing neurologist 9/21 to review return to driving instructions/recommendations   Target Date 02/16/21      OT LONG TERM GOAL #6   Title With her right hand, patient will carry a hot beverage without spilling while walking 15 feet level surface    Time 8    Period Weeks    Status On-going    Target Date 02/16/21      OT LONG TERM GOAL #7   Title Patient will complete FOTO survey at discharge with d/c  score of 75    Baseline FS 64, Risk adjusted 52    Time 8    Period Weeks    Status On-going    Target Date 02/16/21                   Plan - 12/18/20 1800     Clinical Impression Statement Patient is returning to therapy as she is concerned for her rehabilitation.  She is opting to decrease frequency, to allow  her to continue to work and prevent excessive fatigue,  Will recertify for extension of timeline for therapy to allow for this accomodation.    OT Occupational Profile and History Detailed Assessment- Review of Records and additional review of physical, cognitive, psychosocial history related to current functional performance    Occupational performance deficits (Please refer to evaluation for details): ADL's;IADL's;Work;Rest and Sleep;Leisure    Body Structure / Function / Physical Skills ADL;Strength;Balance;Dexterity;GMC;Pain;Tone;Body mechanics;UE functional use;Endurance;IADL;Coordination;Mobility;FMC    Rehab Potential Excellent    Clinical Decision Making Several treatment options, min-mod task modification necessary    Comorbidities Affecting Occupational Performance: May have comorbidities impacting occupational performance    Modification or Assistance to Complete Evaluation  No modification of tasks or assist necessary to complete eval    OT Frequency 2x / week    OT Duration 8 weeks    OT Treatment/Interventions Self-care/ADL training;DME and/or AE instruction;Splinting;Balance training;Aquatic Therapy;Therapeutic activities;Cognitive remediation/compensation;Therapeutic exercise;Neuromuscular education;Functional Mobility Training;Visual/perceptual remediation/compensation;Patient/family education;Manual Therapy;Electrical Stimulation    Plan Patient has returned to therapy from brief hold.    Consulted and Agree with Plan of Care Patient;Family member/caregiver             Patient will benefit from skilled therapeutic intervention in order to improve the following deficits and impairments:   Body Structure / Function / Physical Skills: ADL, Strength, Balance, Dexterity, GMC, Pain, Tone, Body mechanics, UE functional use, Endurance, IADL, Coordination, Mobility, FMC       Visit Diagnosis: Hemiplegia and hemiparesis following cerebral infarction affecting right dominant side  (HCC) - Plan: Ot plan of care cert/re-cert  Pain in right arm - Plan: Ot plan of care cert/re-cert  Muscle weakness (generalized) - Plan: Ot plan of care cert/re-cert  Unsteadiness on feet - Plan: Ot plan of care cert/re-cert  Other lack of coordination - Plan: Ot plan of care cert/re-cert  Ataxia - Plan: Ot plan of care cert/re-cert    Problem List Patient Active Problem List   Diagnosis Date Noted   Dysfunctional uterine bleeding 11/28/2020   Left pontine cerebrovascular accident (HCC) 10/01/2020   HTN (hypertension)    DM2 (diabetes mellitus, type 2) (HCC)    HLD (hyperlipidemia)    Depression     Collier Salina, OT/L 12/18/2020, 6:13 PM   [facility] [address] [place], [place], [address] Phone: [phone]   Fax:  [phone]  Name: Yessika Otte MRN: [id number] Date of Birth: 05-20-1970

## 2020-12-25 ENCOUNTER — Ambulatory Visit: Payer: BC Managed Care – PPO

## 2020-12-25 ENCOUNTER — Ambulatory Visit: Payer: BC Managed Care – PPO | Attending: Physician Assistant | Admitting: Occupational Therapy

## 2020-12-25 ENCOUNTER — Encounter: Payer: Self-pay | Admitting: Occupational Therapy

## 2020-12-25 ENCOUNTER — Other Ambulatory Visit: Payer: Self-pay

## 2020-12-25 DIAGNOSIS — M6281 Muscle weakness (generalized): Secondary | ICD-10-CM | POA: Diagnosis present

## 2020-12-25 DIAGNOSIS — I69351 Hemiplegia and hemiparesis following cerebral infarction affecting right dominant side: Secondary | ICD-10-CM | POA: Insufficient documentation

## 2020-12-25 DIAGNOSIS — R262 Difficulty in walking, not elsewhere classified: Secondary | ICD-10-CM | POA: Insufficient documentation

## 2020-12-25 DIAGNOSIS — R2681 Unsteadiness on feet: Secondary | ICD-10-CM | POA: Diagnosis present

## 2020-12-25 DIAGNOSIS — R278 Other lack of coordination: Secondary | ICD-10-CM | POA: Diagnosis present

## 2020-12-25 DIAGNOSIS — R2689 Other abnormalities of gait and mobility: Secondary | ICD-10-CM | POA: Insufficient documentation

## 2020-12-25 DIAGNOSIS — R270 Ataxia, unspecified: Secondary | ICD-10-CM | POA: Insufficient documentation

## 2020-12-25 DIAGNOSIS — M79601 Pain in right arm: Secondary | ICD-10-CM | POA: Insufficient documentation

## 2020-12-25 NOTE — Patient Instructions (Signed)
   Aquatic Therapy: What to Expect!  Where:  [facility] [address] [place], [place] [address] [phone]           How to Prepare: Please make sure you drink 8 ounces of water about one hour prior to your pool session A caregiver must attend the entire session with the patient (unless your primary therapists feels this is not necessary). The caregiver will be responsible for assisting with dressing as well as any toileting needs.  Please arrive IN YOUR SUIT and a few minutes prior to your appointment - this helps to avoid delays in starting your session. Please make sure to attend to any toileting needs prior to entering the pool Once on the pool deck your therapist will ask you to sign the Patient  Consent and Assignment of Benefits form Your therapist may take your blood pressure prior to, during and after your session if indicated We usually try and create a home exercise program based on activities we do in the pool.  Please be thinking about who might be able to assist you in the pool should you want to participate in an aquatic home exercise program at the time of discharge.  Some patients do not want to or do not have the ability to participate in an aquatic home program - this is not a barrier in any way to you participating in aquatic therapy as part of your current therapy plan!    About the pool: Entering the pool Your therapist will assist you; there are multiple ways to enter including stairs with railings, a walk in ramp, a roll in chair and a mechanical lift. Your therapist will determine the most appropriate way for you. Water temperature is usually between 86-87 degrees There may be other swimmers in the pool at the same time     Contact Info:             Appointments: [facility]         All sessions are 45 minutes   [address]  [address]            Please call the [facility]  if   [place], [place]  [address]           you need to cancel or reschedule an appointment.  [phone]           Kerry Fort, PT    Bretta Bang, OTR/L Cell [phone]   09/12/19

## 2020-12-25 NOTE — Therapy (Signed)
[facility] [address] [place], [place], [address] Phone: [phone]   Fax:  [phone]  Occupational Therapy Treatment  Patient Details  Name: Amber [name] MRN: [id number] Date of Birth: Apr 22, 1970 Referring Provider (OT): Harvel Ricks   Encounter Date: 12/25/2020   OT End of Session - 12/25/20 1825     Visit Number 11    Number of Visits 17    Date for OT Re-Evaluation 02/16/21    Authorization Type BCBS 2022 - VL:MN    OT Start Time 1615    OT Stop Time 1700    OT Time Calculation (min) 45 min    Activity Tolerance Patient tolerated treatment well    Behavior During Therapy WFL for tasks assessed/performed             Past Medical History:  Diagnosis Date   Cerebrovascular disease    Depression    DM2 (diabetes mellitus, type 2) (HCC)    HLD (hyperlipidemia)    HTN (hypertension)     Past Surgical History:  Procedure Laterality Date   right eye surgery Right    had retinal hemorrhage    There were no vitals filed for this visit.   Subjective Assessment - 12/25/20 1817     Subjective  I nearly fell - it was a good stumble - but I did not fall.  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 Yes    Pain Location Shoulder    Pain Orientation Right    Pain Descriptors / Indicators Aching    Pain Type Chronic pain    Pain Onset More than a month ago    Pain Frequency Intermittent    Aggravating Factors  using arm, end range    Pain Relieving Factors medication                [facility] OT Assessment - 12/25/20 0001       Coordination   Right 9 Hole Peg Test 34.44      Hand Function   Right Hand Grip (lbs) 45.1                      OT Treatments/Exercises (OP) - 12/25/20 0001       ADLs   Cooking Worked on goal relating to patient carrying hot liquid x 15 feet.  Patient able  to reach for cup or mug and hand off to either right or left.  Patient using strategy to tuck arm into body to hel stabilize (while seated)  Patient able to carry mug/glass while walking without device.  Patient had difficulty with gross motor coordiantion - controlling arm/leg and trunk.  Worked with patient for improved upright posture and reducing lateral weight shift for walking - then patient able to carry mug over 100 feet.    ADL Comments Patient with significant improvement in grip strength and time for 9 hole peg test today.                    OT Education - 12/25/20 1824     Education Details progress with coordination, grip strength, toward goal achievement.  Aquatic program trial 10/24.    Person(s) Educated Patient;Spouse    Methods Explanation;Handout;Demonstration    Comprehension Verbalized understanding;Returned Conservation officer, nature  Term Goals - 12/25/20 1827       OT SHORT TERM GOAL #1   Title Patient will complete HEP designed to improve RUE coordination    Time 4    Period Weeks    Status Achieved    Target Date 12/06/20      OT SHORT TERM GOAL #2   Title Patient will complete an HEP designed to improve right grip strength    Time 4    Period Weeks    Status Achieved   red theraputty - demonstrated independent     OT SHORT TERM GOAL #3   Title Patient will demonstrate improved ability to maintain arm over head sufficient time to wet / rinse hair with hand held shower head    Time 4    Period Weeks    Status Achieved      OT SHORT TERM GOAL #4   Title Patient will self report improved control of vertical motion when brushing teeth    Time 4    Period Weeks    Status Achieved      OT SHORT TERM GOAL #5   Title Patient will demonstrate understanding of return to work recommendations    Time 4    Period Weeks    Status Achieved   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    Time 4    Period Weeks    Status Achieved               OT Long Term Goals - 12/25/20 1827       OT LONG TERM GOAL #1   Title Patient will complete updated HEP for RUE strength and coordination    Time 8    Period Weeks    Status On-going      OT LONG TERM GOAL #2   Title Patient will demonstrate at least a10 lb increase in right grip strength to ease opening packages, carrying items, etc.    Baseline 35lb    Time 8    Period Weeks    Status Achieved   35 lbs RUE 12/02/20     OT LONG TERM GOAL #3   Title Patient will complete 9 hole peg test in 45 sec or less    Baseline 58 sec    Time 8    Period Weeks    Status Achieved      OT LONG TERM GOAL #4   Title Patient will demonstrate improved ability to load/unload dishwasher, place dishes in shoulder height cabinets    Time 8    Period Weeks    Status On-going   patient is manually washing dishes     OT LONG TERM GOAL #5   Title Patient will demonstrate understanding of return to driving recommendations    Time 8    Period Weeks    Status Achieved   pt is seeing neurologist 9/21 to review return to driving instructions/recommendations     OT LONG TERM GOAL #6   Title With her right hand, patient will carry a hot beverage without spilling while walking 15 feet level surface    Time 8    Period Weeks    Status On-going      OT LONG TERM GOAL #7  Title Patient will complete FOTO survey at discharge with d/c score of 75    Baseline FS 64, Risk adjusted 52    Time 8    Period Weeks    Status On-going                   Plan - 12/25/20 1825     Clinical Impression Statement Patient continues to show improved motoric control in RUE, however is still challenged by gross motor coordination and balance in daily tasks    OT Occupational Profile and  History Detailed Assessment- Review of Records and additional review of physical, cognitive, psychosocial history related to current functional performance    Occupational performance deficits (Please refer to evaluation for details): ADL's;IADL's;Work;Rest and Sleep;Leisure    Body Structure / Function / Physical Skills ADL;Strength;Balance;Dexterity;GMC;Pain;Tone;Body mechanics;UE functional use;Endurance;IADL;Coordination;Mobility;FMC    Rehab Potential Excellent    Clinical Decision Making Several treatment options, min-mod task modification necessary    Comorbidities Affecting Occupational Performance: May have comorbidities impacting occupational performance    Modification or Assistance to Complete Evaluation  No modification of tasks or assist necessary to complete eval    OT Frequency 2x / week    OT Duration 8 weeks    OT Treatment/Interventions Self-care/ADL training;DME and/or AE instruction;Splinting;Balance training;Aquatic Therapy;Therapeutic activities;Cognitive remediation/compensation;Therapeutic exercise;Neuromuscular education;Functional Mobility Training;Visual/perceptual remediation/compensation;Patient/family education;Manual Therapy;Electrical Stimulation    Plan aquatic therapy - RUE, RLE, balance    Consulted and Agree with Plan of Care Patient;Family member/caregiver    Family Member Consulted fiance Sean             Patient will benefit from skilled therapeutic intervention in order to improve the following deficits and impairments:   Body Structure / Function / Physical Skills: ADL, Strength, Balance, Dexterity, GMC, Pain, Tone, Body mechanics, UE functional use, Endurance, IADL, Coordination, Mobility, FMC       Visit Diagnosis: Hemiplegia and hemiparesis following cerebral infarction affecting right dominant side (HCC)  Pain in right arm  Muscle weakness (generalized)  Unsteadiness on feet  Other lack of coordination  Ataxia    Problem  List Patient Active Problem List   Diagnosis Date Noted   Dysfunctional uterine bleeding 11/28/2020   Left pontine cerebrovascular accident (HCC) 10/01/2020   HTN (hypertension)    DM2 (diabetes mellitus, type 2) (HCC)    HLD (hyperlipidemia)    Depression     Collier Salina, OT/L 12/25/2020, 6:28 PM   [facility] [address] [place], [place], [address] Phone: [phone]   Fax:  [phone]  Name: Nidhi Jacome MRN: [id number] Date of Birth: Mar 03, 1971

## 2020-12-29 ENCOUNTER — Encounter: Payer: Self-pay | Admitting: Family Medicine

## 2020-12-29 ENCOUNTER — Ambulatory Visit (INDEPENDENT_AMBULATORY_CARE_PROVIDER_SITE_OTHER): Payer: BC Managed Care – PPO | Admitting: Family Medicine

## 2020-12-29 ENCOUNTER — Telehealth: Payer: Self-pay | Admitting: Family Medicine

## 2020-12-29 ENCOUNTER — Other Ambulatory Visit: Payer: Self-pay

## 2020-12-29 ENCOUNTER — Ambulatory Visit (INDEPENDENT_AMBULATORY_CARE_PROVIDER_SITE_OTHER): Payer: BC Managed Care – PPO

## 2020-12-29 VITALS — BP 128/80 | HR 90 | Ht 67.0 in | Wt 266.2 lb

## 2020-12-29 DIAGNOSIS — Z23 Encounter for immunization: Secondary | ICD-10-CM

## 2020-12-29 DIAGNOSIS — R638 Other symptoms and signs concerning food and fluid intake: Secondary | ICD-10-CM

## 2020-12-29 DIAGNOSIS — E1159 Type 2 diabetes mellitus with other circulatory complications: Secondary | ICD-10-CM

## 2020-12-29 DIAGNOSIS — Z1231 Encounter for screening mammogram for malignant neoplasm of breast: Secondary | ICD-10-CM

## 2020-12-29 DIAGNOSIS — R4189 Other symptoms and signs involving cognitive functions and awareness: Secondary | ICD-10-CM

## 2020-12-29 DIAGNOSIS — N938 Other specified abnormal uterine and vaginal bleeding: Secondary | ICD-10-CM

## 2020-12-29 DIAGNOSIS — Z1159 Encounter for screening for other viral diseases: Secondary | ICD-10-CM

## 2020-12-29 DIAGNOSIS — I10 Essential (primary) hypertension: Secondary | ICD-10-CM

## 2020-12-29 DIAGNOSIS — I679 Cerebrovascular disease, unspecified: Secondary | ICD-10-CM

## 2020-12-29 DIAGNOSIS — F32A Depression, unspecified: Secondary | ICD-10-CM

## 2020-12-29 MED ORDER — OZEMPIC (0.25 OR 0.5 MG/DOSE) 2 MG/1.5ML ~~LOC~~ SOPN: 0.5000 mg | PEN_INJECTOR | SUBCUTANEOUS | 1 refills | Status: DC

## 2020-12-29 MED ORDER — NALTREXONE HCL 50 MG PO TABS: 50.0000 mg | ORAL_TABLET | Freq: Every day | ORAL | 2 refills | Status: DC

## 2020-12-29 MED ORDER — CYCLOBENZAPRINE HCL 5 MG PO TABS: 5.0000 mg | ORAL_TABLET | Freq: Three times a day (TID) | ORAL | 0 refills | Status: DC | PRN

## 2020-12-29 NOTE — Assessment & Plan Note (Signed)
Improved and she is to follow-up with her gynecologist.

## 2020-12-29 NOTE — Patient Instructions (Addendum)
It was wonderful to see you today.  Please bring ALL of your medications with you to every visit.   Today we talked about: STOP GLIPIZIDE   INCREASE OZEMPIC    -- I will complete the paperwork this week   -- I will call you with blood work  --I recommend you undergo a mammogram.   You can call to schedule an appointment by calling [phone].   Directions [address] [place], [place] [address]  Please let me know if you have questions. I will send you a letter or call you with results.    You will be called about neuropsycholgoy    Thank you for choosing [facility].   Please call [phone] with any questions about today's appointment.  Please be sure to schedule follow up at the front  desk before you leave today.   Terisa Starr, MD  Family Medicine

## 2020-12-29 NOTE — Assessment & Plan Note (Signed)
Current stressors include change in functional status and seeking disability.  Supportive listening provided.  Consider therapy resources again at follow-up.

## 2020-12-29 NOTE — Telephone Encounter (Signed)
Reviewed, completed, and signed form.  Note routed to RN team inbasket and placed completed form in Clinic RN's office (wall pocket above desk).  Chasiti Waddington M Jadamarie Butson, MD   

## 2020-12-29 NOTE — Assessment & Plan Note (Signed)
Improved control on repeat.  Continue to monitor and continue current therapy.

## 2020-12-29 NOTE — Assessment & Plan Note (Signed)
Discussed that glipizide can contribute to weight gain.  We discussed optimizing dietary changes, increasing GLP-1 agonist in hopes of eventually reducing insulin.  May be able to add SGLT2 inhibitor in future as well.  This would also help with weight loss

## 2020-12-29 NOTE — Telephone Encounter (Signed)
Form faxed to [facility] and a copy was made for batch scanning.

## 2020-12-29 NOTE — Progress Notes (Signed)
SUBJECTIVE:   CHIEF COMPLAINT: naltrexone, diabetes  HPI:   Amber [name] is a 50 y.o. yo with history notable for type 2 diabetes, hypertension, obesity and depression presenting for routine follow-up for her diabetes and discuss a number of other concerns.  The patient would like disability paperwork completed.  She is currently in eighth grade teacher.  Her required skills include writing on the board, writing essential questions every day many fine motor skills, lesson planning grading papers lesson planning and multitasking.  She had finds difficulty focusing.  She finds that her handwriting is no longer need enough to be read by her students.  She is not able to multitask.  She uses a walker to ambulate now and is unable to move quickly enough within the classroom to maintain classroom control.  The patient reports compliance with her diabetes medications.  Her last A1c is 11.7.  She denies hypo or hyperglycemia.  She denies signs or symptoms of neuropathy.  She has upcoming cataract surgery later this year or in 2023.  She is currently taking Lantus 70 units, lispro with meals, glipizide 5 mg and semaglutide.  She is intolerant of metformin therapy.  She is just started her high intensity statin.  She is very interested in naltrexone.  She reports that she has had many people suggest this to her and she is very interested in trying this.  She does not use opioids.  She is no longer taking tramadol.  She understands this is an off label use of the medication does, with associated potential adverse effects.  The patient reports her menses are back to normal.  She denies signs or symptoms of anemia.  PERTINENT  PMH / PSH/Family/Social History : Updated and reviewed as appropriate.  OBJECTIVE:   BP 128/80   Pulse 90   Ht 5\' 7"  (1.702 m)   Wt 266 lb 3.2 oz (120.7 kg)   SpO2 95%   BMI 41.69 kg/m   Today's weight:  Last Weight  Most recent update: 12/29/2020 10:32 AM    Weight   120.7 kg (266 lb 3.2 oz)            Review of prior weights: Filed Weights   12/29/20 1031  Weight: 266 lb 3.2 oz (120.7 kg)    Pleasant appearing woman in no distress cardiac exam regular rate and rhythm no murmurs rubs or gallops.  Lungs clear bilaterally to auscultation.  No lower extremity edema. ASSESSMENT/PLAN:   HTN (hypertension) Improved control on repeat.  Continue to monitor and continue current therapy.  Depression Current stressors include change in functional status and seeking disability.  Supportive listening provided.  Consider therapy resources again at follow-up.  Dysfunctional uterine bleeding Improved and she is to follow-up with her gynecologist.  DM2 (diabetes mellitus, type 2) (HCC) Due for A1c at follow-up.  Discussed at length.  Discontinue glipizide today.  Semaglutide increased 0.5 mg.  Increase again at follow-up.  We will repeat lipid panel at follow-up.  Discussed naltrexone at length.  Discussed this is off label use.  Discussed potential side effects and lack of evidence of efficacy.  Patient voiced understanding of this.  She would very much like a prescription for trial. prescription sent to pharmacy to trial for 1 month.   Cognitive changes, noticed after stroke and suspect this is residual.  Although her mood disorder may be contributing I suspect this is largely an adjustment related to the stroke.  Referral to neuropsychology for potential testing  and recommendations for additional therapies.  Healthcare maintenance mammogram ordered and hepatitis C today. PCV20 and COVID booster  given today.   At follow-up A1c and repeat direct LDL   Terisa Starr, MD  Family Medicine Teaching Service  [facility]

## 2020-12-29 NOTE — Assessment & Plan Note (Signed)
Due for A1c at follow-up.  Discussed at length.  Discontinue glipizide today.  Semaglutide increased 0.5 mg.  Increase again at follow-up.  We will repeat lipid panel at follow-up.  Discussed naltrexone at length.  Discussed this is off label use.  Discussed potential side effects and lack of evidence of efficacy.  Patient voiced understanding of this.  She would very much like a prescription for trial. prescription sent to pharmacy to trial for 1 month.

## 2020-12-29 NOTE — Addendum Note (Signed)
Addended by: Manson Passey, Casha Estupinan on: 12/29/2020 02:10 PM   Modules accepted: Orders

## 2020-12-30 ENCOUNTER — Other Ambulatory Visit: Payer: Self-pay | Admitting: Family Medicine

## 2020-12-30 DIAGNOSIS — I679 Cerebrovascular disease, unspecified: Secondary | ICD-10-CM

## 2020-12-30 LAB — HCV AB W REFLEX TO QUANT PCR: HCV Ab: <0.1 s/co ratio (ref 0.0–0.9)

## 2020-12-30 LAB — HCV INTERPRETATION

## 2021-01-01 ENCOUNTER — Encounter: Payer: BC Managed Care – PPO | Admitting: Physical Medicine & Rehabilitation

## 2021-01-05 ENCOUNTER — Other Ambulatory Visit: Payer: Self-pay

## 2021-01-05 ENCOUNTER — Encounter: Payer: Self-pay | Admitting: Family Medicine

## 2021-01-05 ENCOUNTER — Ambulatory Visit (HOSPITAL_COMMUNITY)
Admission: RE | Admit: 2021-01-05 | Discharge: 2021-01-05 | Disposition: A | Discharge status: Home / Self Care | Payer: BC Managed Care – PPO | Source: Ambulatory Visit | Attending: Family Medicine | Admitting: Family Medicine

## 2021-01-05 DIAGNOSIS — M25511 Pain in right shoulder: Secondary | ICD-10-CM | POA: Diagnosis not present

## 2021-01-05 DIAGNOSIS — G8929 Other chronic pain: Secondary | ICD-10-CM | POA: Insufficient documentation

## 2021-01-06 ENCOUNTER — Encounter: Payer: Self-pay | Admitting: Family Medicine

## 2021-01-07 ENCOUNTER — Encounter: Payer: Self-pay | Admitting: Psychology

## 2021-01-07 ENCOUNTER — Encounter: Payer: Self-pay | Admitting: Family Medicine

## 2021-01-07 DIAGNOSIS — N920 Excessive and frequent menstruation with regular cycle: Secondary | ICD-10-CM

## 2021-01-08 ENCOUNTER — Encounter: Payer: Self-pay | Admitting: Occupational Therapy

## 2021-01-08 ENCOUNTER — Other Ambulatory Visit: Payer: Self-pay

## 2021-01-08 ENCOUNTER — Telehealth: Payer: Self-pay | Admitting: Occupational Therapy

## 2021-01-08 ENCOUNTER — Ambulatory Visit: Payer: BC Managed Care – PPO | Admitting: Occupational Therapy

## 2021-01-08 DIAGNOSIS — M79601 Pain in right arm: Secondary | ICD-10-CM

## 2021-01-08 DIAGNOSIS — R2681 Unsteadiness on feet: Secondary | ICD-10-CM

## 2021-01-08 DIAGNOSIS — R278 Other lack of coordination: Secondary | ICD-10-CM

## 2021-01-08 DIAGNOSIS — M6281 Muscle weakness (generalized): Secondary | ICD-10-CM

## 2021-01-08 DIAGNOSIS — I69351 Hemiplegia and hemiparesis following cerebral infarction affecting right dominant side: Secondary | ICD-10-CM | POA: Diagnosis not present

## 2021-01-08 DIAGNOSIS — I639 Cerebral infarction, unspecified: Secondary | ICD-10-CM

## 2021-01-08 NOTE — Telephone Encounter (Signed)
Thank you for the message---a referral has been placed.   CB

## 2021-01-08 NOTE — Therapy (Signed)
[facility] [address] [place], [place], [address] Phone: [phone]   Fax:  [phone]  Occupational Therapy Treatment  Patient Details  Name: Amber [name] MRN: [id number] Date of Birth: 09-23-1970 Referring Provider (OT): Harvel Ricks   Encounter Date: 01/08/2021   OT End of Session - 01/08/21 1542     Visit Number 12    Number of Visits 17    Date for OT Re-Evaluation 02/16/21    Authorization Type BCBS 2022 - [place]    OT Start Time 1531    OT Stop Time 1615    OT Time Calculation (min) 44 min    Activity Tolerance Patient tolerated treatment well    Behavior During Therapy WFL for tasks assessed/performed             Past Medical History:  Diagnosis Date   Cerebrovascular disease    Depression    DM2 (diabetes mellitus, type 2) (HCC)    HLD (hyperlipidemia)    HTN (hypertension)     Past Surgical History:  Procedure Laterality Date   right eye surgery Right    had retinal hemorrhage    There were no vitals filed for this visit.   Subjective Assessment - 01/08/21 1536     Subjective  "I have left the classroom"    Patient is accompanied by: Family member   fiance, Shaun   Currently in Pain? No/denies    Pain Score 0-No pain    Pain Onset --              Trail Making A in good ime and with 100% accuracy Trail Making B in 3 minutes and 50 seconds - req'd increased time and completed with mod difficulty  Constant Therapy Alternating Symbol level 6 with 91% accuracy and 73.28s average response time.  Pt expressing and demonstrating deficits with alternating attention and higher level cognitive function. Will send message to MD re:                        OT Short Term Goals - 12/25/20 1827       OT SHORT TERM GOAL #1   Title Patient will complete HEP designed to improve RUE coordination    Time 4    Period Weeks    Status Achieved    Target Date 12/06/20       OT SHORT TERM GOAL #2   Title Patient will complete an HEP designed to improve right grip strength    Time 4    Period Weeks    Status Achieved   red theraputty - demonstrated independent     OT SHORT TERM GOAL #3   Title Patient will demonstrate improved ability to maintain arm over head sufficient time to wet / rinse hair with hand held shower head    Time 4    Period Weeks    Status Achieved      OT SHORT TERM GOAL #4   Title Patient will self report improved control of vertical motion when brushing teeth    Time 4    Period Weeks    Status Achieved      OT SHORT TERM GOAL #5   Title Patient will demonstrate understanding of return to work recommendations    Time 4    Period Weeks    Status Achieved   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    Time 4    Period Weeks    Status Achieved               OT Long Term Goals - 12/25/20 1827       OT LONG TERM GOAL #1   Title Patient will complete updated HEP for RUE strength and coordination    Time 8    Period Weeks    Status On-going      OT LONG TERM GOAL #2   Title Patient will demonstrate at least a10 lb increase in right grip strength to ease opening packages, carrying items, etc.    Baseline 35lb    Time 8    Period Weeks    Status Achieved   35 lbs RUE 12/02/20     OT LONG TERM GOAL #3   Title Patient will complete 9 hole peg test in 45 sec or less    Baseline 58 sec    Time 8    Period Weeks    Status Achieved      OT LONG TERM GOAL #4   Title Patient will demonstrate improved ability to load/unload dishwasher, place dishes in shoulder height cabinets    Time 8    Period Weeks    Status On-going   patient is manually washing dishes     OT LONG TERM GOAL #5   Title Patient will  demonstrate understanding of return to driving recommendations    Time 8    Period Weeks    Status Achieved   pt is seeing neurologist 9/21 to review return to driving instructions/recommendations     OT LONG TERM GOAL #6   Title With her right hand, patient will carry a hot beverage without spilling while walking 15 feet level surface    Time 8    Period Weeks    Status On-going      OT LONG TERM GOAL #7   Title Patient will complete FOTO survey at discharge with d/c score of 75    Baseline FS 64, Risk adjusted 52    Time 8    Period Weeks    Status On-going                   Plan - 01/08/21 1625     Clinical Impression Statement Expressed and demonstrates deficits and difficulty with alternating attention and dual tasking. Pt has now left the classroom and is focusing on stroke recovery.    OT Occupational Profile and History Detailed Assessment- Review of Records and additional review of physical, cognitive, psychosocial history related to current functional performance    Occupational performance deficits (Please refer to evaluation for details): ADL's;IADL's;Work;Rest and Sleep;Leisure    Body Structure / Function / Physical Skills ADL;Strength;Balance;Dexterity;GMC;Pain;Tone;Body mechanics;UE functional use;Endurance;IADL;Coordination;Mobility;FMC    Rehab Potential Excellent    Clinical Decision Making Several treatment options, min-mod task modification necessary    Comorbidities Affecting Occupational Performance: May have comorbidities impacting occupational performance    Modification or Assistance to Complete Evaluation  No modification of tasks or assist necessary to complete eval    OT Frequency 2x / week    OT Duration 8 weeks    OT Treatment/Interventions Self-care/ADL training;DME and/or AE instruction;Splinting;Balance training;Aquatic Therapy;Therapeutic activities;Cognitive remediation/compensation;Therapeutic exercise;Neuromuscular education;Functional  Mobility Training;Visual/perceptual remediation/compensation;Patient/family education;Manual Therapy;Statistician  Plan aquatic therapy - RUE, RLE, balance    Consulted and Agree with Plan of Care Patient;Family member/caregiver    Family Member Consulted fiance Sean             Patient will benefit from skilled therapeutic intervention in order to improve the following deficits and impairments:   Body Structure / Function / Physical Skills: ADL, Strength, Balance, Dexterity, GMC, Pain, Tone, Body mechanics, UE functional use, Endurance, IADL, Coordination, Mobility, FMC       Visit Diagnosis: Hemiplegia and hemiparesis following cerebral infarction affecting right dominant side (HCC)  Muscle weakness (generalized)  Pain in right arm  Unsteadiness on feet  Other lack of coordination    Problem List Patient Active Problem List   Diagnosis Date Noted   Morbid obesity (HCC) 12/29/2020   Dysfunctional uterine bleeding 11/28/2020   Left pontine cerebrovascular accident (HCC) 10/01/2020   HTN (hypertension)    DM2 (diabetes mellitus, type 2) (HCC)    HLD (hyperlipidemia)    Depression     Junious Dresser, OT/L 01/08/2021, 4:25 PM  [facility] [address] [place], [place], [address] Phone: [phone]   Fax:  [phone]  Name: Amber [name] MRN: [id number] Date of Birth: 24-Apr-1970

## 2021-01-08 NOTE — Addendum Note (Signed)
Addended by: Manson Passey, Abednego Yeates on: 01/08/2021 07:20 PM   Modules accepted: Orders

## 2021-01-08 NOTE — Telephone Encounter (Signed)
Dr. Manson Passey,  We are seeing Amber [name] at our clinic for occupational therapy. She has expressed and demonstrated difficulty and deficits with higher level cognition, problem solving, dual tasking and organizational skills and I think would benefit from a ST evaluation. If you are in agreement, will you send a referral for Speech therapy to our clinic ([facility]) via Epic?  Thank you for all you do, Kallie Edward, MOT, OTR/L

## 2021-01-12 ENCOUNTER — Encounter: Payer: Self-pay | Admitting: Family Medicine

## 2021-01-12 ENCOUNTER — Encounter: Payer: Self-pay | Admitting: Occupational Therapy

## 2021-01-12 ENCOUNTER — Ambulatory Visit: Payer: BC Managed Care – PPO | Admitting: Occupational Therapy

## 2021-01-12 ENCOUNTER — Other Ambulatory Visit: Payer: Self-pay

## 2021-01-12 DIAGNOSIS — R270 Ataxia, unspecified: Secondary | ICD-10-CM

## 2021-01-12 DIAGNOSIS — M79601 Pain in right arm: Secondary | ICD-10-CM

## 2021-01-12 DIAGNOSIS — R278 Other lack of coordination: Secondary | ICD-10-CM

## 2021-01-12 DIAGNOSIS — R2681 Unsteadiness on feet: Secondary | ICD-10-CM

## 2021-01-12 DIAGNOSIS — I69351 Hemiplegia and hemiparesis following cerebral infarction affecting right dominant side: Secondary | ICD-10-CM | POA: Diagnosis not present

## 2021-01-12 DIAGNOSIS — M6281 Muscle weakness (generalized): Secondary | ICD-10-CM

## 2021-01-12 NOTE — Therapy (Signed)
[facility] [address] [place], [place], [address] Phone: [phone]   Fax:  [phone]  Occupational Therapy Treatment  Patient Details  Name: Amber [name] MRN: [id number] Date of Birth: 1970-05-08 Referring Provider (OT): Harvel Ricks   Encounter Date: 01/12/2021   OT End of Session - 01/12/21 1803     Visit Number 13    Number of Visits 17    Date for OT Re-Evaluation 02/16/21    Authorization Type BCBS 2022 - VL:[place]    OT Start Time 1330    OT Stop Time 1415    OT Time Calculation (min) 45 min    Equipment Utilized During Treatment flotation equipment    Activity Tolerance Patient tolerated treatment well    Behavior During Therapy WFL for tasks assessed/performed             Past Medical History:  Diagnosis Date   Cerebrovascular disease    Depression    DM2 (diabetes mellitus, type 2) (HCC)    HLD (hyperlipidemia)    HTN (hypertension)     Past Surgical History:  Procedure Laterality Date   right eye surgery Right    had retinal hemorrhage    There were no vitals filed for this visit.   Subjective Assessment - 01/12/21 1802     Subjective  I carried a hot liquid in my right without spilling.    Patient is accompanied by: Family member    Currently in Pain? No/denies    Pain Score 0-No pain              Patient seen for first aquatic therapy visit.  Patient treated in 3-4.5 ft of warm water.  Patient walked on deck without walker and min assist.  She entered and exited pool via stairs and supervision.  Worked on acclimating to water walking in 84ft forward, backward, and side stepping.  Patient with tendency to drift rightward.  Worked on reducing effort to allow arms to float to surface, and return to her sides.  Followed with single water dumbbell to address shoulder flexion, extension, abduction/adduction, horizontal abd/add.  Worked on more sustained activation of right UE  stabilizers - through plank and sideplank like positions on underwater bench.  Patient had difficulty sustaining side plank posture with good alignment in mild turbulence.  Worked in supine to address shoulder range of motion. Patient with pain free motion through full range passively then actively.  Able to swim on her back x several lengths.  Patient reported feeling nauseated when returned to upright and she vomited twice once she exited the pool - stating motion sensitivity.                        OT Short Term Goals - 01/12/21 1806       OT SHORT TERM GOAL #1   Title Patient will complete HEP designed to improve RUE coordination    Time 4    Period Weeks    Status Achieved    Target Date 12/06/20      OT SHORT TERM GOAL #2   Title Patient will complete an HEP designed to improve right grip strength    Time 4    Period Weeks    Status Achieved   red theraputty - demonstrated independent     OT SHORT TERM GOAL #3   Title Patient will demonstrate improved ability to maintain arm over head sufficient time to wet /  rinse hair with hand held shower head    Time 4    Period Weeks    Status Achieved      OT SHORT TERM GOAL #4   Title Patient will self report improved control of vertical motion when brushing teeth    Time 4    Period Weeks    Status Achieved      OT SHORT TERM GOAL #5   Title Patient will demonstrate understanding of return to work recommendations    Time 4    Period Weeks    Status Achieved   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    Time 4    Period Weeks    Status Achieved               OT Long Term Goals - 01/12/21 1806       OT LONG TERM GOAL #1   Title Patient will complete updated HEP for RUE strength and coordination     Time 8    Period Weeks    Status On-going      OT LONG TERM GOAL #2   Title Patient will demonstrate at least a10 lb increase in right grip strength to ease opening packages, carrying items, etc.    Baseline 35lb    Time 8    Period Weeks    Status Achieved   35 lbs RUE 12/02/20     OT LONG TERM GOAL #3   Title Patient will complete 9 hole peg test in 45 sec or less    Baseline 58 sec    Time 8    Period Weeks    Status Achieved      OT LONG TERM GOAL #4   Title Patient will demonstrate improved ability to load/unload dishwasher, place dishes in shoulder height cabinets    Time 8    Period Weeks    Status On-going   patient is manually washing dishes     OT LONG TERM GOAL #5   Title Patient will demonstrate understanding of return to driving recommendations    Time 8    Period Weeks    Status Achieved   pt is seeing neurologist 9/21 to review return to driving instructions/recommendations     OT LONG TERM GOAL #6   Title With her right hand, patient will carry a hot beverage without spilling while walking 15 feet level surface    Time 8    Period Weeks    Status On-going      OT LONG TERM GOAL #7   Title Patient will complete FOTO survey at discharge with d/c score of 75    Baseline FS 64, Risk adjusted 52    Time 8    Period Weeks    Status On-going                   Plan - 01/12/21 1804     Clinical Impression Statement Patient reports she has had some relief with shoulder pain using Voltaren gel.    OT Occupational Profile and History Detailed Assessment- Review of Records and additional review of physical, cognitive, psychosocial history related to current functional performance    Occupational performance deficits (Please refer to evaluation for details): ADL's;IADL's;Work;Rest and Sleep;Leisure  Body Structure / Function / Physical Skills ADL;Strength;Balance;Dexterity;GMC;Pain;Tone;Body mechanics;UE functional  use;Endurance;IADL;Coordination;Mobility;FMC    Rehab Potential Excellent    Clinical Decision Making Several treatment options, min-mod task modification necessary    Comorbidities Affecting Occupational Performance: May have comorbidities impacting occupational performance    Modification or Assistance to Complete Evaluation  No modification of tasks or assist necessary to complete eval    OT Frequency 2x / week    OT Duration 8 weeks    OT Treatment/Interventions Self-care/ADL training;DME and/or AE instruction;Splinting;Balance training;Aquatic Therapy;Therapeutic activities;Cognitive remediation/compensation;Therapeutic exercise;Neuromuscular education;Functional Mobility Training;Visual/perceptual remediation/compensation;Patient/family education;Manual Therapy;Electrical Stimulation    Plan aquatic therapy - RUE, RLE, balance    Consulted and Agree with Plan of Care Patient;Family member/caregiver    Family Member Consulted fiance Sean             Patient will benefit from skilled therapeutic intervention in order to improve the following deficits and impairments:   Body Structure / Function / Physical Skills: ADL, Strength, Balance, Dexterity, GMC, Pain, Tone, Body mechanics, UE functional use, Endurance, IADL, Coordination, Mobility, FMC       Visit Diagnosis: Hemiplegia and hemiparesis following cerebral infarction affecting right dominant side (HCC)  Muscle weakness (generalized)  Pain in right arm  Unsteadiness on feet  Other lack of coordination  Ataxia    Problem List Patient Active Problem List   Diagnosis Date Noted   Morbid obesity (HCC) 12/29/2020   Dysfunctional uterine bleeding 11/28/2020   Left pontine cerebrovascular accident (HCC) 10/01/2020   HTN (hypertension)    DM2 (diabetes mellitus, type 2) (HCC)    HLD (hyperlipidemia)    Depression     Collier Salina, OT/L 01/12/2021, 6:09 PM  [facility] [address] [place], [place], [address] Phone: [phone]   Fax:  [phone]  Name: Amber [name] MRN: [id number] Date of Birth: [birth year]

## 2021-01-13 ENCOUNTER — Other Ambulatory Visit: Payer: Self-pay

## 2021-01-13 ENCOUNTER — Ambulatory Visit: Payer: BC Managed Care – PPO

## 2021-01-13 ENCOUNTER — Encounter: Payer: BC Managed Care – PPO | Admitting: Occupational Therapy

## 2021-01-13 DIAGNOSIS — R2681 Unsteadiness on feet: Secondary | ICD-10-CM

## 2021-01-13 DIAGNOSIS — R262 Difficulty in walking, not elsewhere classified: Secondary | ICD-10-CM

## 2021-01-13 DIAGNOSIS — M6281 Muscle weakness (generalized): Secondary | ICD-10-CM

## 2021-01-13 DIAGNOSIS — R2689 Other abnormalities of gait and mobility: Secondary | ICD-10-CM

## 2021-01-13 DIAGNOSIS — I69351 Hemiplegia and hemiparesis following cerebral infarction affecting right dominant side: Secondary | ICD-10-CM | POA: Diagnosis not present

## 2021-01-13 MED ORDER — LISINOPRIL-HYDROCHLOROTHIAZIDE 20-12.5 MG PO TABS: 1.0000 | ORAL_TABLET | Freq: Every day | ORAL | 3 refills | Status: DC

## 2021-01-13 NOTE — Therapy (Signed)
[facility] [address] [place], [place], [address] Phone: [phone]   Fax:  [phone]  Physical Therapy Treatment/Re-Certification  Patient Details  Name: Amber [name] MRN: [id number] Date of Birth: February 08, 1971 Referring Provider (PT): Lauraine Rinne, PA-C   Encounter Date: 01/13/2021   PT End of Session - 01/13/21 1625     Visit Number 8    Number of Visits 17    Date for PT Re-Evaluation 02/13/21    Authorization Type BCBS    PT Start Time 1616    PT Stop Time 1657    PT Time Calculation (min) 41 min    Equipment Utilized During Treatment Gait belt    Activity Tolerance Patient tolerated treatment well    Behavior During Therapy WFL for tasks assessed/performed             Past Medical History:  Diagnosis Date   Cerebrovascular disease    Depression    DM2 (diabetes mellitus, type 2) ([place])    HLD (hyperlipidemia)    HTN (hypertension)     Past Surgical History:  Procedure Laterality Date   right eye surgery Right    had retinal hemorrhage    There were no vitals filed for this visit.   Subjective Assessment - 01/13/21 1620     Subjective Patient returns after being on hold. Patient reports she notices she still wants to drift to the right side. 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 She is deciding to retire. No falls. Patient reports using a SPC with quad tip in the house.    Patient is accompained by: Family member   Fiance   Pertinent History HLD, HTN, DM2, Depression    Limitations Standing;Walking;House hold activities    How long can you walk comfortably? < 5 minutes    Diagnostic tests MRI showed a 10 mm acute infarction in the left pons.  No hemorrhage or mass-effect. Echocardiogram with ejection fraction of 60 to 40% grade 2 diastolic dysfunction    Patient Stated Goals Wants to be Independence, Get Rid of the RW    Currently  in Pain? No/denies    Pain Onset More than a month ago                [facility] PT Assessment - 01/13/21 0001       Assessment   Medical Diagnosis Left Pons CVA    Referring Provider (PT) Lauraine Rinne, PA-C      Precautions   Precautions [place] Limbo Adult PT Treatment/Exercise - 01/13/21 0001       Transfers   Transfers Sit to Stand;Stand to Sit    Sit to Stand 5: Supervision    Five time sit to stand comments  14.2 secs without UE support from standard chair. good control noted with descent    Stand to Sit 5: Supervision      Ambulation/Gait   Ambulation/Gait Yes    Ambulation/Gait Assistance 5: Supervision;4: Min guard    Ambulation/Gait Assistance Details Completed ambulation with SPC x 115 ft, mild imbalance noted often going to the R side. Patient able to maintain balance with CGA. Improved stability noted with slowed gait speed    Ambulation Distance (Feet) 115 Feet    Assistive device Straight cane    Gait Pattern Step-through pattern;Decreased stance time - right;Decreased hip/knee flexion - right;Decreased step  length - left    Ambulation Surface Level;Indoor    Gait velocity 12.35 seconds = 2.65 ft/seconds      Standardized Balance Assessment   Standardized Balance Assessment Berg Balance Test      Berg Balance Test   Sit to Stand Able to stand without using hands and stabilize independently    Standing Unsupported Able to stand safely 2 minutes    Sitting with Back Unsupported but Feet Supported on Floor or Stool Able to sit safely and securely 2 minutes    Stand to Sit Sits safely with minimal use of hands    Transfers Able to transfer safely, minor use of hands    Standing Unsupported with Eyes Closed Able to stand 10 seconds safely    Standing Ubsupported with Feet Together Able to place feet together independently and stand for 1 minute with supervision    From Standing, Reach Forward with Outstretched Arm Can reach confidently >25 cm  (10")    From Standing Position, Pick up Object from [place] to pick up shoe safely and easily    From Standing Position, Turn to Look Behind Over each Shoulder Looks behind from both sides and weight shifts well    Turn 360 Degrees Able to turn 360 degrees safely but slowly    Standing Unsupported, Alternately Place Feet on Step/Stool Able to stand independently and complete 8 steps >20 seconds    Standing Unsupported, One Foot in Front Able to plae foot ahead of the other independently and hold 30 seconds    Standing on One Leg Tries to lift leg/unable to hold 3 seconds but remains standing independently    Total Score 48              PT Education - 01/13/21 1625     Education Details progress toward goals; updated POC    Person(s) Educated Patient    Methods Explanation    Comprehension Verbalized understanding              PT Short Term Goals - 11/25/20 1634       PT SHORT TERM GOAL #1   Title Patient will be independent with initial HEP for balance/strength (ALL STGs Due: 11/21/2020)    Baseline 11/25/20 Pt has been busy with back to school and has not been consistent in performance.    Time 4    Period Weeks    Status Partially Met    Target Date 11/21/20      PT SHORT TERM GOAL #2   Title Patient will improve TUG to </= 12 seconds with LRAD to demo reduced fall risk    Baseline 13.13 secs w/ RW. 10.81 sec with RW on 11/25/20    Time 4    Period Weeks    Status Achieved      PT SHORT TERM GOAL #3   Title Patient will undergo 2MWT/6MWT and LTG to be set    Baseline 6MWT was performed and LTG written    Time 4    Period Weeks    Status Achieved      PT SHORT TERM GOAL #4   Title Patient will improve Berg Balance to >/= 47/56 to demo reduced fall risk    Baseline 44/56. 11/25/20 47/56    Time 4    Period Weeks    Status Achieved               PT Long Term Goals - 01/13/21 1030  PT LONG TERM GOAL #1   Title Patient will be independent with  final HEP for balance/strength and daily walking program (ALL LTGs Due: 12/19/2020)    Baseline no HEP established; reports independence with current HEP    Time 8    Period Weeks    Status Achieved      PT LONG TERM GOAL #2   Title Patient will improve 5x sit <> stand to </= 10 seconds for improved mobiltiy and reduced fall risk    Baseline 13.65 seconds; 14.2 secs    Time 8    Period Weeks    Status Not Met      PT LONG TERM GOAL #3   Title Patient will improve gait speed w/ LRAD to 2.5 ft/sec to demo improved community mobility    Baseline 2.1 ft/sec; 2.65 ft/sec    Time 8    Period Weeks    Status Achieved      PT LONG TERM GOAL #4   Title Patient will improve Berg Balance to >/= 50/56 to demo reduced fall risk and improved balance    Baseline 44/56 48/56    Time 8    Period Weeks    Status Not Met      PT LONG TERM GOAL #5   Title Pt will increase 6MWT to >600' completing entire 6 minutes for improved gait ability and activity tolerance.    Baseline 10/28/20  345' stopping at 4 min 17 sec; unable to assess today due to time/fatigue    Time 8    Period Weeks    Status On-going      PT LONG TERM GOAL #6   Title Patient will improve FOTO to >/= 65%    Baseline 55%    Time 8    Period Weeks    Status On-going           Updated Short Term Goals:   PT Short Term Goals - 01/13/21 1928       PT SHORT TERM GOAL #1   Title = LTGs            Updated Long Term Goals:   PT Long Term Goals - 01/13/21 1928       PT LONG TERM GOAL #1   Title Patient will be independent with final HEP for balance/strength and report walking >/= 30 minutes daily(ALL LTGs Due: 02/13/2021)    Baseline no HEP established; reports independence with current HEP    Time 4    Period Weeks    Status Revised    Target Date 02/13/21      PT LONG TERM GOAL #2   Title Patient will improve 5x sit <> stand to </= 10 seconds for improved mobiltiy and reduced fall risk    Baseline 13.65  seconds; 14.2 secs    Time 4    Period Weeks    Status On-going      PT LONG TERM GOAL #3   Title Patient will improve gait speed w/ LRAD to 2.8 ft/sec to demo improved community mobility    Baseline 2.1 ft/sec; 2.65 ft/sec    Time 4    Period Weeks    Status Revised      PT LONG TERM GOAL #4   Title Patient will improve Berg Balance to >/= 50/56 to demo reduced fall risk and improved balance    Baseline 44/56 48/56    Time 4    Period Weeks    Status On-going  PT LONG TERM GOAL #5   Title Pt will increase 6MWT to >600' completing entire 6 minutes for improved gait ability and activity tolerance.    Baseline 10/28/20  345' stopping at 4 min 17 sec; unable to assess today due to time/fatigue    Time 4    Period Weeks    Status On-going      PT LONG TERM GOAL #6   Title Patient will improve FOTO to >/= 65%    Baseline 55%    Time 4    Period Weeks    Status On-going                    Plan - 01/13/21 1659     Clinical Impression Statement Patient returns after being on hold with PT services. 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 Returns with reports of using SPC in the home. Completed assesment toward goals and determine changes since absence. Patient actually demo improvements in [place] to 48/56, and gait speed to 2.65 ft/sec with SPC. With use of SPC patient demo mild imbalance at times, noted to the R side. Patient did demonstrate gradual decline of 5x sit <> stand to 14.2 seconds. Patient will benefit from skilled PT services to address impairments and maximize functional mobility.    Personal Factors and Comorbidities Comorbidity 3+    Comorbidities HLD, HTN, DM2, Depression    Examination-Activity Limitations Bed Mobility;Sit;Stairs;Transfers;Locomotion Level;Stand    Examination-Participation Restrictions Occupation;Community Activity;Driving    Stability/Clinical Decision Making Stable/Uncomplicated     Rehab Potential Good    PT Frequency 2x / week    PT Duration 4 weeks    PT Treatment/Interventions ADLs/Self Care Home Management;Aquatic Therapy;Cryotherapy;Electrical Stimulation;Moist Heat;DME Instruction;Balance training;Therapeutic exercise;Therapeutic activities;Functional mobility training;Stair training;Gait training;Neuromuscular re-education;Patient/family education;Orthotic Fit/Training;Vestibular;Passive range of motion;Manual techniques    PT Next Visit Plan Continue gait with cane with quad tip. Continue with strengthening and standing balance Update HEP?Marland Kitchen Focus on improving right foot clearance and placement. Sci Fit for aerobic endurance (performed at level 2.1 x 4 min with arms at 6 and seat at 16 today)    Consulted and Agree with Plan of Care Patient;Family member/caregiver    Family Member Consulted Fiance             Patient will benefit from skilled therapeutic intervention in order to improve the following deficits and impairments:  Abnormal gait, Postural dysfunction, Decreased coordination, Decreased activity tolerance, Decreased endurance, Decreased strength, Difficulty walking, Decreased balance, Decreased knowledge of use of DME  Visit Diagnosis: Muscle weakness (generalized)  Unsteadiness on feet  Other abnormalities of gait and mobility  Difficulty in walking, not elsewhere classified     Problem List Patient Active Problem List   Diagnosis Date Noted   Morbid obesity ([place]) 12/29/2020   Dysfunctional uterine bleeding 11/28/2020   Left pontine cerebrovascular accident ([place]) 10/01/2020   HTN (hypertension)    DM2 (diabetes mellitus, type 2) ([place])    HLD (hyperlipidemia)    Depression     Jones Bales, PT, DPT 01/13/2021, 7:27 PM  [facility] [address] [place], [place], [address] Phone: [phone]   Fax:  [phone]  Name: Gwyneth Fernandez MRN: [id number] Date of Birth:  06-20-1970

## 2021-01-14 ENCOUNTER — Telehealth: Payer: Self-pay | Admitting: Family Medicine

## 2021-01-14 NOTE — Telephone Encounter (Signed)
Reviewed, completed, and signed form.  Note routed to RN team inbasket and placed completed form in Clinic RN's office (wall pocket above desk).  Tonisha Silvey M Daelen [place]vedere, MD   

## 2021-01-15 ENCOUNTER — Ambulatory Visit: Payer: BC Managed Care – PPO

## 2021-01-15 ENCOUNTER — Other Ambulatory Visit: Payer: Self-pay

## 2021-01-15 ENCOUNTER — Encounter: Payer: Self-pay | Admitting: Family Medicine

## 2021-01-15 ENCOUNTER — Telehealth: Payer: Self-pay | Admitting: Family Medicine

## 2021-01-15 ENCOUNTER — Encounter: Payer: BC Managed Care – PPO | Admitting: Occupational Therapy

## 2021-01-15 DIAGNOSIS — R2689 Other abnormalities of gait and mobility: Secondary | ICD-10-CM

## 2021-01-15 NOTE — Telephone Encounter (Signed)
Called patient, she reports she has floater---advised patient  of stroke symptoms, ED precautions reviewed--she would like to go to eye doctor, will proceed to ED if any changes/new symptoms.  Terisa Starr, MD  Family Medicine Teaching Service

## 2021-01-15 NOTE — Therapy (Signed)
[facility] [address] [place], [place], [address] Phone: [phone]   Fax:  [phone]  Physical Therapy Treatment- arrived no charge  Patient Details  Name: Amber [name] MRN: [id number] Date of Birth: 1970/04/22 Referring Provider (PT): Mariam Dollar, PA-C   Encounter Date: 01/15/2021   PT End of Session - 01/15/21 1623     Visit Number 8    Number of Visits 17    Date for PT Re-Evaluation 02/13/21    Authorization Type BCBS    PT Start Time 1618    PT Stop Time 1630    PT Time Calculation (min) 12 min    Equipment Utilized During Treatment Gait belt    Activity Tolerance Patient tolerated treatment well    Behavior During Therapy WFL for tasks assessed/performed             Past Medical History:  Diagnosis Date   Cerebrovascular disease    Depression    DM2 (diabetes mellitus, type 2) ([facility])    HLD (hyperlipidemia)    HTN (hypertension)     Past Surgical History:  Procedure Laterality Date   right eye surgery Right    had retinal hemorrhage    There were no vitals filed for this visit.   Subjective Assessment - 01/15/21 1624     Subjective Pt reports that her sugar was 500 on the way here but she did take her insulin. PT insisted she check again prior to doing any therapy. Pt reports vision has not been as good this week. Pt checked sugar and was 468. Witheld treatment due to elevated blood sugar.    Patient is accompained by: Family member   Fiance   Pertinent History HLD, HTN, DM2, Depression    Limitations Standing;Walking;House hold activities    How long can you walk comfortably? < 5 minutes    Diagnostic tests MRI showed a 10 mm acute infarction in the left pons.  No hemorrhage or mass-effect. Echocardiogram with ejection fraction of 60 to 65% grade 2 diastolic dysfunction    Patient Stated Goals Wants to be Independence, Get Rid of the RW    Currently in Pain? No/denies    Pain  Onset More than a month ago                                          PT Short Term Goals - 01/13/21 1928       PT SHORT TERM GOAL #1   Title = LTGs               PT Long Term Goals - 01/13/21 1928       PT LONG TERM GOAL #1   Title Patient will be independent with final HEP for balance/strength and report walking >/= 30 minutes daily(ALL LTGs Due: 02/13/2021)    Baseline no HEP established; reports independence with current HEP    Time 4    Period Weeks    Status Revised    Target Date 02/13/21      PT LONG TERM GOAL #2   Title Patient will improve 5x sit <> stand to </= 10 seconds for improved mobiltiy and reduced fall risk    Baseline 13.65 seconds; 14.2 secs    Time 4    Period Weeks    Status On-going      PT LONG TERM GOAL #  3   Title Patient will improve gait speed w/ LRAD to 2.8 ft/sec to demo improved community mobility    Baseline 2.1 ft/sec; 2.65 ft/sec    Time 4    Period Weeks    Status Revised      PT LONG TERM GOAL #4   Title Patient will improve Berg Balance to >/= 50/56 to demo reduced fall risk and improved balance    Baseline 44/56 48/56    Time 4    Period Weeks    Status On-going      PT LONG TERM GOAL #5   Title Pt will increase to >600' completing entire 6 minutes for improved gait ability and activity tolerance.    Baseline 10/28/20  345' stopping at 4 min 17 sec; unable to assess today due to time/fatigue    Time 4    Period Weeks    Status On-going      PT LONG TERM GOAL #6   Title Patient will improve FOTO to >/= 65%    Baseline 55%    Time 4    Period Weeks    Status On-going                   Plan - 01/15/21 1636     Clinical Impression Statement Pt arrived for therapy with blood sugar of 468. Therapy witheld due to this. Pt has already taken insulin and fiance was with her. Advised to monitor sugar to make sure it came back down in safe range. Instructed to check sugar prior to  therapy and not come if 300 or higher.    Personal Factors and Comorbidities Comorbidity 3+    Comorbidities HLD, HTN, DM2, Depression    Examination-Activity Limitations Bed Mobility;Sit;Stairs;Transfers;Locomotion Level;Stand    Examination-Participation Restrictions Occupation;Community Activity;Driving    Stability/Clinical Decision Making Stable/Uncomplicated    Rehab Potential Good    PT Frequency 2x / week    PT Duration 4 weeks    PT Treatment/Interventions ADLs/Self Care Home Management;Aquatic Therapy;Cryotherapy;Electrical Stimulation;Moist Heat;DME Instruction;Balance training;Therapeutic exercise;Therapeutic activities;Functional mobility training;Stair training;Gait training;Neuromuscular re-education;Patient/family education;Orthotic Fit/Training;Vestibular;Passive range of motion;Manual techniques    PT Next Visit Plan How is blood sugar doing? Continue gait with cane with quad tip. Continue with strengthening and standing balance Update HEP?Marland Kitchen Focus on improving right foot clearance and placement. Sci Fit for aerobic endurance (performed at level 2.1 x 4 min with arms at 6 and seat at 16 today)    Consulted and Agree with Plan of Care Patient;Family member/caregiver    Family Member Consulted Fiance             Patient will benefit from skilled therapeutic intervention in order to improve the following deficits and impairments:  Abnormal gait, Postural dysfunction, Decreased coordination, Decreased activity tolerance, Decreased endurance, Decreased strength, Difficulty walking, Decreased balance, Decreased knowledge of use of DME  Visit Diagnosis: Other abnormalities of gait and mobility     Problem List Patient Active Problem List   Diagnosis Date Noted   Morbid obesity ([facility]) 12/29/2020   Dysfunctional uterine bleeding 11/28/2020   Left pontine cerebrovascular accident ([facility]) 10/01/2020   HTN (hypertension)    DM2 (diabetes mellitus, type 2) ([facility])    HLD  (hyperlipidemia)    Depression     Ronn Melena, PT, DPT, NCS 01/15/2021, 4:37 PM  [facility] [address] [place], [place], [address] Phone: [phone]   Fax:  [phone]  Name: Amber [name] MRN:  [id number] Date of Birth: December 09, 1970

## 2021-01-16 LAB — HM DIABETES EYE EXAM

## 2021-01-19 ENCOUNTER — Encounter: Payer: Self-pay | Admitting: Occupational Therapy

## 2021-01-19 ENCOUNTER — Ambulatory Visit: Payer: BC Managed Care – PPO | Attending: Physician Assistant | Admitting: Occupational Therapy

## 2021-01-19 ENCOUNTER — Other Ambulatory Visit: Payer: Self-pay

## 2021-01-19 DIAGNOSIS — M79601 Pain in right arm: Secondary | ICD-10-CM | POA: Insufficient documentation

## 2021-01-19 DIAGNOSIS — I69351 Hemiplegia and hemiparesis following cerebral infarction affecting right dominant side: Secondary | ICD-10-CM | POA: Insufficient documentation

## 2021-01-19 DIAGNOSIS — R270 Ataxia, unspecified: Secondary | ICD-10-CM | POA: Insufficient documentation

## 2021-01-19 DIAGNOSIS — M6281 Muscle weakness (generalized): Secondary | ICD-10-CM | POA: Insufficient documentation

## 2021-01-19 DIAGNOSIS — R278 Other lack of coordination: Secondary | ICD-10-CM | POA: Insufficient documentation

## 2021-01-19 DIAGNOSIS — R2681 Unsteadiness on feet: Secondary | ICD-10-CM | POA: Insufficient documentation

## 2021-01-19 NOTE — Therapy (Signed)
[facility] [address] [place], [place], [address] Phone: [phone]   Fax:  [phone]  Occupational Therapy Treatment  Patient Details  Name: Amber [name] MRN: [id number] Date of Birth: September 10, 1970 Referring Provider (OT): Harvel Ricks   Encounter Date: 01/19/2021   OT End of Session - 01/19/21 1911     Visit Number 14    Number of Visits 17    Date for OT Re-Evaluation 02/16/21    Authorization Type BCBS 2022 - [id number]    OT Start Time 1330    OT Stop Time 1415    OT Time Calculation (min) 45 min    Equipment Utilized During Treatment flotation equipment    Activity Tolerance Patient tolerated treatment well    Behavior During Therapy WFL for tasks assessed/performed             Past Medical History:  Diagnosis Date   Cerebrovascular disease    Depression    DM2 (diabetes mellitus, type 2) (HCC)    HLD (hyperlipidemia)    HTN (hypertension)     Past Surgical History:  Procedure Laterality Date   right eye surgery Right    had retinal hemorrhage    There were no vitals filed for this visit.   Subjective Assessment - 01/19/21 1911     Subjective  I am scheduled for eye surgery next week, and I have bleeding in my other eye now - so I am essentially blind right now.    Patient is accompanied by: Family member    Currently in Pain? No/denies    Pain Score 0-No pain             Patient seen for aquatic therapy visit.  Patient entered and exited pool via stairs and min assist to account for decreased vision.   Session occurred in 3.5-4.5 ft of water.   Maintained upright position this session due to nausea/ vomiting following last week's session.   Worked on upper body resistance training, initially with hand paddles, then water dumbbells, and resistance bands.  Worked on components of shoulder flex/abd/add/ext and rotation in pain free ranges.   Worked on balance with beginning [employer]  training away from wall.  Patient did best with slow movement to reduce turbulence.  Also worked with underwater step - to address postural contro/balance stepping up and down off step.   Pt requires buoyancy for support and to offload joints with strengthening exercises. Viscosity of the water is needed for resistance of strengthening; water current perturbations provides challenge to standing balance unsupported, requiring increased core activation.                        OT Short Term Goals - 01/19/21 1913       OT SHORT TERM GOAL #1   Title Patient will complete HEP designed to improve RUE coordination    Time 4    Period Weeks    Status Achieved    Target Date 12/06/20      OT SHORT TERM GOAL #2   Title Patient will complete an HEP designed to improve right grip strength    Time 4    Period Weeks    Status Achieved   red theraputty - demonstrated independent     OT SHORT TERM GOAL #3   Title Patient will demonstrate improved ability to maintain arm over head sufficient time to wet / rinse hair with hand held shower head  Time 4    Period Weeks    Status Achieved      OT SHORT TERM GOAL #4   Title Patient will self report improved control of vertical motion when brushing teeth    Time 4    Period Weeks    Status Achieved      OT SHORT TERM GOAL #5   Title Patient will demonstrate understanding of return to work recommendations    Time 4    Period Weeks    Status Achieved   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    Time 4    Period Weeks    Status Achieved               OT Long Term Goals - 01/19/21 1914       OT LONG TERM GOAL #1   Title Patient will complete updated HEP for RUE strength and coordination    Time 8    Period  Weeks    Status On-going      OT LONG TERM GOAL #2   Title Patient will demonstrate at least a10 lb increase in right grip strength to ease opening packages, carrying items, etc.    Baseline 35lb    Time 8    Period Weeks    Status Achieved   35 lbs RUE 12/02/20     OT LONG TERM GOAL #3   Title Patient will complete 9 hole peg test in 45 sec or less    Baseline 58 sec    Time 8    Period Weeks    Status Achieved      OT LONG TERM GOAL #4   Title Patient will demonstrate improved ability to load/unload dishwasher, place dishes in shoulder height cabinets    Time 8    Period Weeks    Status On-going   patient is manually washing dishes     OT LONG TERM GOAL #5   Title Patient will demonstrate understanding of return to driving recommendations    Time 8    Period Weeks    Status Achieved   pt is seeing neurologist 9/21 to review return to driving instructions/recommendations     OT LONG TERM GOAL #6   Title With her right hand, patient will carry a hot beverage without spilling while walking 15 feet level surface    Time 8    Period Weeks    Status On-going      OT LONG TERM GOAL #7   Title Patient will complete FOTO survey at discharge with d/c score of 75    Baseline FS 64, Risk adjusted 52    Time 8    Period Weeks    Status On-going                   Plan - 01/19/21 1912     Clinical Impression Statement Patient reports she had relief from shoulder pain for days following last pool session.    OT Occupational Profile and History Detailed Assessment- Review of Records and additional review of physical, cognitive, psychosocial history related to current functional performance    Occupational performance deficits (Please refer to evaluation for details): ADL's;IADL's;Work;Rest and Sleep;Leisure    Body Structure / Function / Physical Skills  ADL;Strength;Balance;Dexterity;GMC;Pain;Tone;Body mechanics;UE functional use;Endurance;IADL;Coordination;Mobility;FMC     Rehab Potential Excellent    Clinical Decision Making Several treatment options, min-mod task modification necessary    Comorbidities Affecting Occupational Performance: May have comorbidities impacting occupational performance    Modification or Assistance to Complete Evaluation  No modification of tasks or assist necessary to complete eval    OT Frequency 2x / week    OT Duration 8 weeks    OT Treatment/Interventions Self-care/ADL training;DME and/or AE instruction;Splinting;Balance training;Aquatic Therapy;Therapeutic activities;Cognitive remediation/compensation;Therapeutic exercise;Neuromuscular education;Functional Mobility Training;Visual/perceptual remediation/compensation;Patient/family education;Manual Therapy;Electrical Stimulation    Plan aquatic therapy - RUE, RLE, balance    Consulted and Agree with Plan of Care Patient;Family member/caregiver    Family Member Consulted fiance Sean             Patient will benefit from skilled therapeutic intervention in order to improve the following deficits and impairments:   Body Structure / Function / Physical Skills: ADL, Strength, Balance, Dexterity, GMC, Pain, Tone, Body mechanics, UE functional use, Endurance, IADL, Coordination, Mobility, FMC       Visit Diagnosis: Hemiplegia and hemiparesis following cerebral infarction affecting right dominant side (HCC)  Pain in right arm  Other lack of coordination  Unsteadiness on feet  Muscle weakness (generalized)  Ataxia    Problem List Patient Active Problem List   Diagnosis Date Noted   Morbid obesity (HCC) 12/29/2020   Dysfunctional uterine bleeding 11/28/2020   Left pontine cerebrovascular accident (HCC) 10/01/2020   HTN (hypertension)    DM2 (diabetes mellitus, type 2) (HCC)    HLD (hyperlipidemia)    Depression     Collier Salina, OT/L 01/19/2021, 7:15 PM  [facility] [facility] [address] [place], [place], [address] Phone: [phone]   Fax:  [phone]  Name: Amber [name] MRN: [id number] Date of Birth: 09-30-1970

## 2021-01-20 ENCOUNTER — Encounter: Payer: BC Managed Care – PPO | Admitting: Occupational Therapy

## 2021-01-20 ENCOUNTER — Ambulatory Visit: Payer: BC Managed Care – PPO

## 2021-01-22 ENCOUNTER — Telehealth: Payer: Self-pay | Admitting: Family Medicine

## 2021-01-22 ENCOUNTER — Ambulatory Visit: Payer: BC Managed Care – PPO

## 2021-01-22 ENCOUNTER — Ambulatory Visit: Payer: BC Managed Care – PPO | Admitting: Occupational Therapy

## 2021-01-22 NOTE — Telephone Encounter (Signed)
Insurance form completed. Nursing- please attached MRI from 09/24/20.   Reviewed, completed, and signed form.  Note routed to RN team inbasket and placed completed form in Clinic RN's office (wall pocket above desk).  Westley Chandler, MD

## 2021-01-26 ENCOUNTER — Other Ambulatory Visit: Payer: Self-pay

## 2021-01-26 ENCOUNTER — Ambulatory Visit (INDEPENDENT_AMBULATORY_CARE_PROVIDER_SITE_OTHER): Payer: BC Managed Care – PPO | Admitting: Family Medicine

## 2021-01-26 ENCOUNTER — Encounter: Payer: Self-pay | Admitting: Family Medicine

## 2021-01-26 VITALS — BP 110/70 | HR 95 | Wt 262.4 lb

## 2021-01-26 DIAGNOSIS — E785 Hyperlipidemia, unspecified: Secondary | ICD-10-CM | POA: Diagnosis not present

## 2021-01-26 DIAGNOSIS — I639 Cerebral infarction, unspecified: Secondary | ICD-10-CM | POA: Diagnosis not present

## 2021-01-26 DIAGNOSIS — E1159 Type 2 diabetes mellitus with other circulatory complications: Secondary | ICD-10-CM | POA: Diagnosis not present

## 2021-01-26 MED ORDER — OZEMPIC (1 MG/DOSE) 4 MG/3ML ~~LOC~~ SOPN: 1.0000 mg | PEN_INJECTOR | SUBCUTANEOUS | 3 refills | Status: DC

## 2021-01-26 NOTE — Patient Instructions (Addendum)
It was wonderful to see you today.  Please bring ALL of your medications with you to every visit.   Today we talked about:  - I ordered lab work for you---I will message and call you with results  -- Increase ozempic to 1 mg each week    --Follow up in 2 months to check in on weight   Dr. Marlis Edelson office  Address: [address], [place], [place] [address]  Phone: [phone]   Thank you for choosing [facility].   Please call [phone] with any questions about today's appointment.  Please be sure to schedule follow up at the front  desk before you leave today.   Terisa Starr, MD  Family Medicine

## 2021-01-26 NOTE — Telephone Encounter (Signed)
Faxed to provided number. Copy made and placed in batch scanning.   Cena Bruhn C Amador Braddy, RN  

## 2021-01-26 NOTE — Assessment & Plan Note (Signed)
A1C today. Increased Ozempic. Continue current therapy.  We discussed the limitations and the evidence and off label use of naltrexone.  We discussed possible potential risks we will check liver function today.

## 2021-01-26 NOTE — Progress Notes (Signed)
    SUBJECTIVE:   CHIEF COMPLAINT:diabetes check up  HPI:   Amber [name] is a 50 y.o. yo with history notable for CVA,  hypertension, type 2 diabetes, mood disorder and obesity (improving) presenting for check up.   The patient reports compliance with her diabetes medication.  She reports no adverse symptoms from her Ozempic.  She is at 0.5 mg currently.  She has lost 4 pounds.  She really thinks naltrexone is helping as well.  She denies polyuria or polydipsia.  She is due for an A1c today.  Patient reports her mood is good.  She is no longer thinking about sugar all of the time.  She reports she is pleased with her overall progress but is hoping to get back to more activities in the future.  The patient reports compliance with her medications.  She is regularly doing aquatic therapy which she likes.  She has speech therapy later this week.  She denies new symptoms.  The patient has a cataract in her right eye and currently has some diabetic retinopathy or requiring photocoagulation in her left eye.  She has her surgery on Wednesday.   PERTINENT  PMH / PSH/Family/Social History : Updated and reviewed as appropriate.  OBJECTIVE:   BP 110/70   Pulse 95   Wt 262 lb 6.4 oz (119 kg)   SpO2 96%   BMI 41.10 kg/m   Today's weight:  Last Weight  Most recent update: 01/26/2021 10:53 AM    Weight  119 kg (262 lb 6.4 oz)            Review of prior weights: Filed Weights   01/26/21 1053  Weight: 262 lb 6.4 oz (119 kg)     Cardiac: Regular rate and rhythm. Normal S1/S2. No murmurs, rubs, or gallops appreciated. Lungs: Clear bilaterally to ascultation.  Psych: Pleasant and appropriate    ASSESSMENT/PLAN:   Left pontine cerebrovascular accident (HCC) Repeat repeat direct LDL today.  If this is persistently elevated will add ezetimibe.  Triglycerides check at the next visit if these are persistently elevated will need to consider addition of Vascepa.  DM2 (diabetes mellitus, type  2) (HCC) A1C today. Increased Ozempic. Continue current therapy.  We discussed the limitations and the evidence and off label use of naltrexone.  We discussed possible potential risks we will check liver function today.   HCM  UTD on COVID Colonoscopy due--discussed, will obtain after eye surgeries Foot exam today   Terisa Starr MD  Family Medicine Teaching Service  [facility]

## 2021-01-26 NOTE — Assessment & Plan Note (Signed)
Repeat repeat direct LDL today.  If this is persistently elevated will add ezetimibe.  Triglycerides check at the next visit if these are persistently elevated will need to consider addition of Vascepa.

## 2021-01-27 ENCOUNTER — Telehealth: Payer: Self-pay | Admitting: Family Medicine

## 2021-01-27 ENCOUNTER — Encounter: Payer: BC Managed Care – PPO | Admitting: Occupational Therapy

## 2021-01-27 ENCOUNTER — Ambulatory Visit: Payer: BC Managed Care – PPO

## 2021-01-27 LAB — HEMOGLOBIN A1C
Est. average glucose Bld gHb Est-mCnc: 232 mg/dL
Hgb A1c MFr Bld: 9.7 % — ABNORMAL HIGH (ref 4.8–5.6)

## 2021-01-27 LAB — COMPREHENSIVE METABOLIC PANEL
ALT: 15 IU/L (ref 0–32)
AST: 10 IU/L (ref 0–40)
Albumin/Globulin Ratio: 2.2 (ref 1.2–2.2)
Albumin: 4.7 g/dL (ref 3.8–4.9)
Alkaline Phosphatase: 103 IU/L (ref 44–121)
BUN/Creatinine Ratio: 21 (ref 9–23)
BUN: 16 mg/dL (ref 6–24)
Bilirubin Total: 0.3 mg/dL (ref 0.0–1.2)
CO2: 23 mmol/L (ref 20–29)
Calcium: 9.7 mg/dL (ref 8.7–10.2)
Chloride: 98 mmol/L (ref 96–106)
Creatinine, Ser: 0.78 mg/dL (ref 0.57–1.00)
Globulin, Total: 2.1 g/dL (ref 1.5–4.5)
Glucose: 238 mg/dL — ABNORMAL HIGH (ref 70–99)
Potassium: 4.5 mmol/L (ref 3.5–5.2)
Sodium: 137 mmol/L (ref 134–144)
Total Protein: 6.8 g/dL (ref 6.0–8.5)
eGFR: 91 mL/min/{1.73_m2} (ref >59)

## 2021-01-27 LAB — LDL CHOLESTEROL, DIRECT: LDL Direct: 78 mg/dL (ref 0–99)

## 2021-01-27 NOTE — Telephone Encounter (Signed)
Attempted to call patient.  A1c still above goal but markedly improved.  Consider continuous glucose meter.  She needs optimization of her insulin therapy.  We will try to get her to schedule with pharmacy.  Sent a MyChart message with details.

## 2021-01-28 ENCOUNTER — Encounter: Payer: BC Managed Care – PPO | Admitting: Speech Pathology

## 2021-01-28 LAB — HM DIABETES EYE EXAM

## 2021-01-29 ENCOUNTER — Ambulatory Visit: Payer: BC Managed Care – PPO

## 2021-01-29 ENCOUNTER — Encounter: Payer: BC Managed Care – PPO | Admitting: Occupational Therapy

## 2021-02-03 ENCOUNTER — Ambulatory Visit: Payer: BC Managed Care – PPO | Attending: Family Medicine

## 2021-02-03 ENCOUNTER — Encounter: Payer: BC Managed Care – PPO | Admitting: Occupational Therapy

## 2021-02-03 ENCOUNTER — Other Ambulatory Visit: Payer: Self-pay

## 2021-02-03 DIAGNOSIS — M6281 Muscle weakness (generalized): Secondary | ICD-10-CM | POA: Insufficient documentation

## 2021-02-03 DIAGNOSIS — R2689 Other abnormalities of gait and mobility: Secondary | ICD-10-CM | POA: Insufficient documentation

## 2021-02-03 DIAGNOSIS — I69351 Hemiplegia and hemiparesis following cerebral infarction affecting right dominant side: Secondary | ICD-10-CM | POA: Diagnosis present

## 2021-02-03 DIAGNOSIS — R278 Other lack of coordination: Secondary | ICD-10-CM | POA: Diagnosis present

## 2021-02-03 DIAGNOSIS — M79601 Pain in right arm: Secondary | ICD-10-CM | POA: Insufficient documentation

## 2021-02-03 DIAGNOSIS — R41841 Cognitive communication deficit: Secondary | ICD-10-CM | POA: Diagnosis not present

## 2021-02-03 DIAGNOSIS — R2681 Unsteadiness on feet: Secondary | ICD-10-CM | POA: Diagnosis present

## 2021-02-03 DIAGNOSIS — R262 Difficulty in walking, not elsewhere classified: Secondary | ICD-10-CM | POA: Diagnosis present

## 2021-02-03 NOTE — Telephone Encounter (Signed)
Scanned into chart

## 2021-02-03 NOTE — Therapy (Signed)
[facility] [address] [place], [place], [address] Phone: [phone]   Fax:  [phone]  Physical Therapy Treatment  Patient Details  Name: Amber [name] MRN: [id number] Date of Birth: 02-02-1971 Referring Provider (PT): Mariam Dollar, PA-C   Encounter Date: 02/03/2021   PT End of Session - 02/03/21 1616     Visit Number 9    Number of Visits 17    Date for PT Re-Evaluation 02/13/21    Authorization Type BCBS    PT Start Time 1616    PT Stop Time 1659    PT Time Calculation (min) 43 min    Equipment Utilized During Treatment Gait belt    Activity Tolerance Patient tolerated treatment well    Behavior During Therapy WFL for tasks assessed/performed             Past Medical History:  Diagnosis Date   Cerebrovascular disease    Depression    DM2 (diabetes mellitus, type 2) (HCC)    HLD (hyperlipidemia)    HTN (hypertension)     Past Surgical History:  Procedure Laterality Date   right eye surgery Right    had retinal hemorrhage    There were no vitals filed for this visit.   Subjective Assessment - 02/03/21 1619     Subjective Patient reports that about 3 weeks ago that she had an issue with her L eye increasing difficulty with vision. Reports she see's MD regarding this soon. Patient reports that her blood sugar has been better controlled. Patietn reports overall doing well, still using cane/walker. No falls, but has had a couple stumbles. Reports her vision is affecting her the most.    Patient is accompained by: Family member   Fiance   Pertinent History HLD, HTN, DM2, Depression    Limitations Standing;Walking;House hold activities    How long can you walk comfortably? < 5 minutes    Diagnostic tests MRI showed a 10 mm acute infarction in the left pons.  No hemorrhage or mass-effect. Echocardiogram with ejection fraction of 60 to 65% grade 2 diastolic dysfunction    Patient Stated Goals  Wants to be Independence, Get Rid of the RW    Currently in Pain? No/denies    Pain Onset More than a month ago                               [facility] Adult PT Treatment/Exercise - 02/03/21 0001       Ambulation/Gait   Ambulation/Gait Yes    Ambulation/Gait Assistance 5: Supervision;4: Min guard    Ambulation/Gait Assistance Details ambulation into therapy session with RW. continued use of SPC w/ quad tip throughout session. continue sway/listing noted to R predominately    Ambulation Distance (Feet) --   clinic distance   Assistive device Rolling walker;Straight cane    Gait Pattern Step-through pattern;Decreased stance time - right;Decreased hip/knee flexion - right;Decreased step length - left    Ambulation Surface Level;Indoor      Exercises   Exercises Knee/Hip      Knee/Hip Exercises: Aerobic   Other Aerobic Completed Scifit with BLE only at level 3.5 x 6 minutes prior to needing rest break. Rest break for 1 minute, then completed additional 4 minutes. Patient tolerating well.                 Balance Exercises - 02/03/21 0001       Balance  Exercises: Standing   SLS with Vectors Foam/compliant surface;Upper extremity assist 1;Limitations    SLS with Vectors Limitations completed alternating toe taps to colored cones x 10 reps bilaterally, cues for slowed control movement to promote stnace. increased challenge with SLS on RLE. UE support required on foam.    Rockerboard Anterior/posterior;Head turns;EO;Intermittent UE support;Limitations    Rockerboard Limitations standing on rockerboard A/P completed static standing x 30 seconds, then completed horizontal/vertical head turns x 5 reps each way. increased dizziness with vertical. then completed A/P weight shifts x 15 reps with intermittent UE support.    Step Over Hurdles / Cones in // bars completed reciprocal stepping over hurdles 4 hurdles x 3 laps down and back with BUE support, then single UE support.  then completed step to pattern over orange hurdles x 3 laps down and back. Intermittent UE support and CGA. continue to notice listing to the R side.                  PT Short Term Goals - 01/13/21 1928       PT SHORT TERM GOAL #1   Title = LTGs               PT Long Term Goals - 01/13/21 1928       PT LONG TERM GOAL #1   Title Patient will be independent with final HEP for balance/strength and report walking >/= 30 minutes daily(ALL LTGs Due: 02/13/2021)    Baseline no HEP established; reports independence with current HEP    Time 4    Period Weeks    Status Revised    Target Date 02/13/21      PT LONG TERM GOAL #2   Title Patient will improve 5x sit <> stand to </= 10 seconds for improved mobiltiy and reduced fall risk    Baseline 13.65 seconds; 14.2 secs    Time 4    Period Weeks    Status On-going      PT LONG TERM GOAL #3   Title Patient will improve gait speed w/ LRAD to 2.8 ft/sec to demo improved community mobility    Baseline 2.1 ft/sec; 2.65 ft/sec    Time 4    Period Weeks    Status Revised      PT LONG TERM GOAL #4   Title Patient will improve Berg Balance to >/= 50/56 to demo reduced fall risk and improved balance    Baseline 44/56 48/56    Time 4    Period Weeks    Status On-going      PT LONG TERM GOAL #5   Title Pt will increase to >600' completing entire 6 minutes for improved gait ability and activity tolerance.    Baseline 10/28/20  345' stopping at 4 min 17 sec; unable to assess today due to time/fatigue    Time 4    Period Weeks    Status On-going      PT LONG TERM GOAL #6   Title Patient will improve FOTO to >/= 65%    Baseline 55%    Time 4    Period Weeks    Status On-going                   Plan - 02/03/21 1626     Clinical Impression Statement Patient's blood sugar and vitals WNL for therapy services. Continued skilled PT services focus on improved endurance and balance training patient tolerating well  overall. Continue to  demo increased challenge with SLS. Intermittent rest breaks required due to fatigue.    Personal Factors and Comorbidities Comorbidity 3+    Comorbidities HLD, HTN, DM2, Depression    Examination-Activity Limitations Bed Mobility;Sit;Stairs;Transfers;Locomotion Level;Stand    Examination-Participation Restrictions Occupation;Community Activity;Driving    Stability/Clinical Decision Making Stable/Uncomplicated    Rehab Potential Good    PT Frequency 2x / week    PT Duration 4 weeks    PT Treatment/Interventions ADLs/Self Care Home Management;Aquatic Therapy;Cryotherapy;Electrical Stimulation;Moist Heat;DME Instruction;Balance training;Therapeutic exercise;Therapeutic activities;Functional mobility training;Stair training;Gait training;Neuromuscular re-education;Patient/family education;Orthotic Fit/Training;Vestibular;Passive range of motion;Manual techniques    PT Next Visit Plan determine if we are d/c or re-cert with upcoming eye surgery? will need to schedule out visits and re-cert if want to continue.  Continue gait with cane with quad tip. Continue with strengthening and standing balance. Focus on improving right foot clearance and placement. Sci Fit for aerobic endurance    Consulted and Agree with Plan of Care Patient;Family member/caregiver    Family Member Consulted Fiance             Patient will benefit from skilled therapeutic intervention in order to improve the following deficits and impairments:  Abnormal gait, Postural dysfunction, Decreased coordination, Decreased activity tolerance, Decreased endurance, Decreased strength, Difficulty walking, Decreased balance, Decreased knowledge of use of DME  Visit Diagnosis: Other abnormalities of gait and mobility  Difficulty in walking, not elsewhere classified  Unsteadiness on feet  Muscle weakness (generalized)     Problem List Patient Active Problem List   Diagnosis Date Noted   Morbid obesity (HCC)  12/29/2020   Dysfunctional uterine bleeding 11/28/2020   Left pontine cerebrovascular accident (HCC) 10/01/2020   HTN (hypertension)    DM2 (diabetes mellitus, type 2) (HCC)    HLD (hyperlipidemia)    Depression     Tempie Donning, PT, DPT 02/03/2021, 6:30 PM  [place] [facility] [address] [place], [place], [address] Phone: [phone]   Fax:  [phone]  Name: Amber [name] MRN: [id number] Date of Birth: [birth year]

## 2021-02-04 ENCOUNTER — Telehealth: Payer: Self-pay | Admitting: Family Medicine

## 2021-02-04 NOTE — Telephone Encounter (Signed)
Reviewed, completed, and signed form.  Note routed to RN team inbasket and placed completed form in Clinic RN's office (wall pocket above desk).  Daden Mahany M Druanne Bosques, MD   

## 2021-02-05 ENCOUNTER — Ambulatory Visit: Payer: BC Managed Care – PPO

## 2021-02-05 ENCOUNTER — Ambulatory Visit: Payer: BC Managed Care – PPO | Admitting: Occupational Therapy

## 2021-02-05 ENCOUNTER — Encounter: Payer: Self-pay | Admitting: Occupational Therapy

## 2021-02-05 ENCOUNTER — Other Ambulatory Visit: Payer: Self-pay

## 2021-02-05 VITALS — BP 130/78

## 2021-02-05 DIAGNOSIS — I69351 Hemiplegia and hemiparesis following cerebral infarction affecting right dominant side: Secondary | ICD-10-CM

## 2021-02-05 DIAGNOSIS — M6281 Muscle weakness (generalized): Secondary | ICD-10-CM

## 2021-02-05 DIAGNOSIS — M79601 Pain in right arm: Secondary | ICD-10-CM

## 2021-02-05 DIAGNOSIS — R278 Other lack of coordination: Secondary | ICD-10-CM

## 2021-02-05 DIAGNOSIS — R2689 Other abnormalities of gait and mobility: Secondary | ICD-10-CM | POA: Diagnosis not present

## 2021-02-05 DIAGNOSIS — R2681 Unsteadiness on feet: Secondary | ICD-10-CM

## 2021-02-05 DIAGNOSIS — R41841 Cognitive communication deficit: Secondary | ICD-10-CM

## 2021-02-05 NOTE — Therapy (Signed)
[facility] [address] [place], [place], [address] Phone: [phone]   Fax:  [phone]  Physical Therapy Treatment  Patient Details  Name: Amber [name] MRN: [id number] Date of Birth: [birth year] Referring Provider (PT): Lauraine Rinne, PA-C   Encounter Date: 02/05/2021   PT End of Session - 02/05/21 1616     Visit Number 10    Number of Visits 17    Date for PT Re-Evaluation 02/13/21    Authorization Type BCBS    PT Start Time 1615    PT Stop Time 1700    PT Time Calculation (min) 45 min    Equipment Utilized During Treatment Gait belt    Activity Tolerance Patient tolerated treatment well    Behavior During Therapy WFL for tasks assessed/performed             Past Medical History:  Diagnosis Date   Cerebrovascular disease    Depression    DM2 (diabetes mellitus, type 2) ([place])    HLD (hyperlipidemia)    HTN (hypertension)     Past Surgical History:  Procedure Laterality Date   right eye surgery Right    had retinal hemorrhage    Vitals:   02/05/21 1621  BP: 130/78     Subjective Assessment - 02/05/21 1616     Subjective Pt reports that she has pre-op 16th for her right cataract surgery and then 3 days later she will have the surgery. Pt reports that she had a stroke in left eye and the eye surgeon tried to stop the bleeding but it has not improved. They think was due to the high blood sugar recently. After right eye surgery she will have left eye surgery. Pt reports sugar was 150 today. Pt does feel like she needs to hold therapy until after her right eye surgery. Thinking to hold until January time.    Patient is accompained by: Family member   Fiance   Pertinent History HLD, HTN, DM2, Depression    Limitations Standing;Walking;House hold activities    How long can you walk comfortably? < 5 minutes    Diagnostic tests MRI showed a 10 mm acute infarction in the left pons.  No hemorrhage or  mass-effect. Echocardiogram with ejection fraction of 60 to 64% grade 2 diastolic dysfunction    Patient Stated Goals Wants to be Independence, Get Rid of the RW    Currently in Pain? No/denies    Pain Onset More than a month ago                [facility] PT Assessment - 02/05/21 1627       6 Minute Walk- Baseline   BP (mmHg) 130/78      6 Minute walk- Post Test   BP (mmHg) 140/80    Modified Borg Scale for Dyspnea 5- Strong or hard breathing      6 minute walk test results    Aerobic Endurance Distance Walked 705                           [facility] Adult PT Treatment/Exercise - 02/05/21 1627       Transfers   Transfers Sit to Stand;Stand to Sit    Sit to Stand 6: Modified independent (Device/Increase time)    Five time sit to stand comments  11.46 sec 5 x sit to stand from low mat with no hands    Stand to Sit 6: Modified  independent (Device/Increase time)      Ambulation/Gait   Ambulation/Gait Yes    Ambulation/Gait Assistance 5: Supervision    Ambulation/Gait Assistance Details during 6 min walk. Pt veers to the right at times.    Ambulation Distance (Feet) 705 Feet    Assistive device Straight cane    Gait Pattern Step-through pattern;Decreased step length - right;Decreased step length - left;Decreased hip/knee flexion - right;Wide base of support    Ambulation Surface Level;Indoor    Gait velocity 12.47 sec=0.27ms or 2.63 ft/sec      Standardized Balance Assessment   Standardized Balance Assessment Berg Balance Test      Berg Balance Test   Sit to Stand Able to stand without using hands and stabilize independently    Standing Unsupported Able to stand safely 2 minutes    Sitting with Back Unsupported but Feet Supported on Floor or Stool Able to sit safely and securely 2 minutes    Stand to Sit Sits safely with minimal use of hands    Transfers Able to transfer safely, minor use of hands    Standing Unsupported with Eyes Closed Able to stand 10  seconds safely    Standing Ubsupported with Feet Together Able to place feet together independently and stand 1 minute safely    From Standing, Reach Forward with Outstretched Arm Can reach confidently >25 cm (10")    From Standing Position, Pick up Object from Floor Able to pick up shoe safely and easily    From Standing Position, Turn to Look Behind Over each Shoulder Looks behind from both sides and weight shifts well    Turn 360 Degrees Able to turn 360 degrees safely but slowly    Standing Unsupported, Alternately Place Feet on Step/Stool Able to stand independently and safely and complete 8 steps in 20 seconds    Standing Unsupported, One Foot in Front Able to plae foot ahead of the other independently and hold 30 seconds    Standing on One Leg Tries to lift leg/unable to hold 3 seconds but remains standing independently    Total Score 50                     PT Education - 02/05/21 2031     Education Details Discussed progress towards goals. Plan to put on hold until January after pt's eye surgery. Pt will need resume order from eye doctor.    Person(s) Educated Patient    Methods Explanation    Comprehension Verbalized understanding              PT Short Term Goals - 01/13/21 1928       PT SHORT TERM GOAL #1   Title = LTGs               PT Long Term Goals - 02/05/21 1638       PT LONG TERM GOAL #1   Title Patient will be independent with final HEP for balance/strength and report walking >/= 30 minutes daily(ALL LTGs Due: 02/13/2021)    Baseline no HEP established; 02/05/21 reports independence with current HEP but not at walking goal yet    Time 4    Period Weeks    Status On-going      PT LONG TERM GOAL #2   Title Patient will improve 5x sit <> stand to </= 10 seconds for improved mobiltiy and reduced fall risk    Baseline 13.65 seconds; 14.2 secs. 02/05/2210.46 sec 5 x sit  to stand from low mat with no hands    Time 4    Period Weeks    Status  Partially Met      PT LONG TERM GOAL #3   Title Patient will improve gait speed w/ LRAD to 2.8 ft/sec to demo improved community mobility    Baseline 2.1 ft/sec; 2.65 ft/sec. 02/05/21 0.53ms or 2.669fsec    Time 4    Period Weeks    Status Not Met      PT LONG TERM GOAL #4   Title Patient will improve Berg Balance to >/= 50/56 to demo reduced fall risk and improved balance    Baseline 44/56 48/56. 02/05/21 50/56    Time 4    Period Weeks    Status Achieved      PT LONG TERM GOAL #5   Title Pt will increase 6MWT to >600' completing entire 6 minutes for improved gait ability and activity tolerance.    Baseline 10/28/20  345' stopping at 4 min 17 sec; unable to assess today due to time/fatigue. 02/05/21 705' completing whole time with cane    Time 4    Period Weeks    Status Achieved      PT LONG TERM GOAL #6   Title Patient will improve FOTO to >/= 65%    Baseline 55%, 02/05/21 55%    Time 4    Period Weeks    Status Not Met                   Plan - 02/05/21 2035     Clinical Impression Statement PT assessed LTGs today. Pt met 2/6 goals with progress towards others. Pt increased Berg to 50/56 indicating improving balance and decreasing fall risk. She decreased 5 x sit to stand as well just short of goal to 11.46 sec without hands. She was able to ambulate with cane through the whole 6 min walk covering 705' showing improving activity tolerance with good BP response. She showed improvement in gait speed to 2.6344fec but short of goals. She has been limited by high blood sugars and has new vision issue in left eye due to this. Right eye was already bad with cataract. Pt has upcoming eye surgeries scheduled. Pt will benefit from continued PT to address remaining balance, strength and functional mobility deficits but will place on hold until after eye surgeries to return in January.    Personal Factors and Comorbidities Comorbidity 3+    Comorbidities HLD, HTN, DM2, Depression     Examination-Activity Limitations Bed Mobility;Sit;Stairs;Transfers;Locomotion Level;Stand    Examination-Participation Restrictions Occupation;Community Activity;Driving    Stability/Clinical Decision Making Stable/Uncomplicated    Rehab Potential Good    PT Frequency 2x / week    PT Duration 4 weeks    PT Treatment/Interventions ADLs/Self Care Home Management;Aquatic Therapy;Cryotherapy;Electrical Stimulation;Moist Heat;DME Instruction;Balance training;Therapeutic exercise;Therapeutic activities;Functional mobility training;Stair training;Gait training;Neuromuscular re-education;Patient/family education;Orthotic Fit/Training;Vestibular;Passive range of motion;Manual techniques    PT Next Visit Plan Reassess when returns in January after eye surgery and recert    Consulted and Agree with Plan of Care Patient;Family member/caregiver    Family Member Consulted Fiance             Patient will benefit from skilled therapeutic intervention in order to improve the following deficits and impairments:  Abnormal gait, Postural dysfunction, Decreased coordination, Decreased activity tolerance, Decreased endurance, Decreased strength, Difficulty walking, Decreased balance, Decreased knowledge of use of DME  Visit Diagnosis: Other abnormalities of gait and mobility  Muscle weakness (generalized)  Unsteadiness on feet     Problem List Patient Active Problem List   Diagnosis Date Noted   Morbid obesity ([place]) 12/29/2020   Dysfunctional uterine bleeding 11/28/2020   Left pontine cerebrovascular accident ([place]) 10/01/2020   HTN (hypertension)    DM2 (diabetes mellitus, type 2) ([place])    HLD (hyperlipidemia)    Depression     Electa Sniff, PT, DPT, NCS 02/05/2021, 8:41 PM  [place] [address] [place], [place], [address] Phone: [phone]   Fax:  [phone]  Name: Amber [name] MRN: [id number] Date of Birth:  04-27-1970

## 2021-02-05 NOTE — Telephone Encounter (Signed)
Faxed

## 2021-02-05 NOTE — Therapy (Signed)
[facility] [address] [place], [place], [address] Phone: [phone]   Fax:  [phone]  Occupational Therapy Treatment  Patient Details  Name: Amber [name] MRN: [id number] Date of Birth: 31-May-1970 Referring Provider (OT): Harvel Ricks   Encounter Date: 02/05/2021   OT End of Session - 02/05/21 1553     Visit Number 15    Number of Visits 17    Date for OT Re-Evaluation 02/16/21    Authorization Type BCBS 2022 - [id number]    OT Start Time 1548   late arrival   OT Stop Time 1615    OT Time Calculation (min) 27 min    Equipment Utilized During Treatment flotation equipment    Activity Tolerance Patient tolerated treatment well    Behavior During Therapy WFL for tasks assessed/performed             Past Medical History:  Diagnosis Date   Cerebrovascular disease    Depression    DM2 (diabetes mellitus, type 2) ([facility])    HLD (hyperlipidemia)    HTN (hypertension)     Past Surgical History:  Procedure Laterality Date   right eye surgery Right    had retinal hemorrhage    There were no vitals filed for this visit.   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 No/denies    Pain Score 0-No pain                          OT Treatments/Exercises (OP) - 02/05/21 1557       ADLs   ADL Comments reviewed remaining goals - pt reports much progress however limited right now d/t vision loss.    ADL Education Given Yes    Increased Safety Strategies discussed safety with ambulating and avoiding obstacles d/t vision loss      Visual/Perceptual Exercises   Other Exercises d/t new onset of vision loss, patient  worked on identifying cards with approx 90-95% accuracy with counting shapes when not able to identfy number. Pt than sorted by suits with 100% accuracy.      Fine Motor Coordination (Hand/Wrist)   Fine Motor Coordination Flipping cards;Dealing card with thumb    Flipping cards shuffling cards with bimanual control and sorting cards for coordination.                      OT Short Term Goals - 01/19/21 1913       OT SHORT TERM GOAL #1   Title Patient will complete HEP designed to improve RUE coordination    Time 4    Period Weeks    Status Achieved    Target Date 12/06/20      OT SHORT TERM GOAL #2   Title Patient will complete an HEP designed to improve right grip strength    Time 4    Period Weeks    Status Achieved   red theraputty - demonstrated independent     OT SHORT TERM GOAL #3   Title Patient will demonstrate improved ability to maintain arm over head sufficient time  to wet / rinse hair with hand held shower head    Time 4    Period Weeks    Status Achieved      OT SHORT TERM GOAL #4   Title Patient will self report improved control of vertical motion when brushing teeth    Time 4    Period Weeks    Status Achieved      OT SHORT TERM GOAL #5   Title Patient will demonstrate understanding of return to work recommendations    Time 4    Period Weeks    Status Achieved   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    Time 4    Period Weeks    Status Achieved               OT Long Term Goals - 02/05/21 1554       OT LONG TERM GOAL #1   Title Patient will complete updated HEP for RUE strength and coordination    Time 8    Period Weeks    Status On-going      OT LONG TERM GOAL #2   Title Patient will demonstrate at least a10 lb increase  in right grip strength to ease opening packages, carrying items, etc.    Baseline 35lb    Time 8    Period Weeks    Status Achieved   35 lbs RUE 12/02/20     OT LONG TERM GOAL #3   Title Patient will complete 9 hole peg test in 45 sec or less    Baseline 58 sec    Time 8    Period Weeks    Status Achieved      OT LONG TERM GOAL #4   Title Patient will demonstrate improved ability to load/unload dishwasher, place dishes in shoulder height cabinets    Time 8    Period Weeks    Status On-going   patient reports d/t vision not completing but was completing prior to vision loss. 02/05/21     OT LONG TERM GOAL #5   Title Patient will demonstrate understanding of return to driving recommendations    Time 8    Period Weeks    Status Achieved   pt is seeing neurologist 9/21 to review return to driving instructions/recommendations     OT LONG TERM GOAL #6   Title With her right hand, patient will carry a hot beverage without spilling while walking 15 feet level surface    Time 8    Period Weeks    Status On-going   pt reports completing this without difficulty 02/05/21     OT LONG TERM GOAL #7   Title Patient will complete FOTO survey at discharge with d/c score of 75    Baseline FS 64, Risk adjusted 52    Time 8    Period Weeks    Status On-going                   Plan - 02/05/21 1630     Clinical Impression Statement Pt with new onset of vision loss and scheduled for surgery 12/16 with hopeful resolution. Pt going on hold from OT and PT at this time and will call back to schedule in January after  surgeries. Pt has verbalized understanding of calling to schedule more appts in January and no referral needed.    OT Occupational Profile and History Detailed Assessment- Review of Records and additional review of physical, cognitive, psychosocial history related to current functional performance    Occupational performance deficits (Please refer to evaluation for details):  ADL's;IADL's;Work;Rest and Sleep;Leisure    Body Structure / Function / Physical Skills ADL;Strength;Balance;Dexterity;GMC;Pain;Tone;Body mechanics;UE functional use;Endurance;IADL;Coordination;Mobility;FMC    Rehab Potential Excellent    Clinical Decision Making Several treatment options, min-mod task modification necessary    Comorbidities Affecting Occupational Performance: May have comorbidities impacting occupational performance    Modification or Assistance to Complete Evaluation  No modification of tasks or assist necessary to complete eval    OT Frequency 2x / week    OT Duration 8 weeks    OT Treatment/Interventions Self-care/ADL training;DME and/or AE instruction;Splinting;Balance training;Aquatic Therapy;Therapeutic activities;Cognitive remediation/compensation;Therapeutic exercise;Neuromuscular education;Functional Mobility Training;Visual/perceptual remediation/compensation;Patient/family education;Manual Therapy;Electrical Stimulation    Plan aquatic therapy - RUE, RLE, balance - on hold OT/PT at this time.    Consulted and Agree with Plan of Care Patient;Family member/caregiver    Family Member Consulted fiance Sean             Patient will benefit from skilled therapeutic intervention in order to improve the following deficits and impairments:   Body Structure / Function / Physical Skills: ADL, Strength, Balance, Dexterity, GMC, Pain, Tone, Body mechanics, UE functional use, Endurance, IADL, Coordination, Mobility, FMC       Visit Diagnosis: Muscle weakness (generalized)  Pain in right arm  Other lack of coordination  Hemiplegia and hemiparesis following cerebral infarction affecting right dominant side ([facility])  Unsteadiness on feet    Problem List Patient Active Problem List   Diagnosis Date Noted   Morbid obesity ([facility]) 12/29/2020   Dysfunctional uterine bleeding 11/28/2020   Left pontine cerebrovascular accident ([facility]) 10/01/2020   HTN (hypertension)    DM2  (diabetes mellitus, type 2) ([facility])    HLD (hyperlipidemia)    Depression     Junious Dresser, OT/L 02/05/2021, 4:33 PM  [facility] [address] [place], [place], [address] Phone: [phone]   Fax:  [phone]  Name: Amber [name] MRN: [id number] Date of Birth: Feb 14, 1971

## 2021-02-05 NOTE — Therapy (Signed)
[facility] [address] [place], [place], [address] Phone: [phone]   Fax:  [phone]  Patient Details  Name: Amber [name] MRN: [id number] Date of Birth: Oct 24, 1970 Referring Provider:  Westley Chandler MD  Encounter Date: 02/05/2021  SLP discussed with patient, her fianc, and Physical Therapist Elmer Bales). PT/OT will be on hold until new year as pt has upcoming eye surgery scheduled. Pt requested to wait for ST evaluation when she returns for PT/OT. Pt aware of need for resume orders (per [facility]) and to call back to schedule her appointments.   Janann Colonel, MA CCC-SLP 02/05/2021, 5:18 PM   [facility] [address] [place], [place], [address] Phone: [phone]   Fax:  [phone]

## 2021-02-06 NOTE — Therapy (Signed)
[facility] [address] [place], [place], [address] Phone: [phone]   Fax:  [phone]  Patient Details  Name: Amber [name] MRN: [id number] Date of Birth: 1971-01-18 Referring Provider:  Westley Chandler, MD  Encounter Date: 02/05/2021  SLP discussed with patient, her fianc, and Physical Therapist Elmer Bales). PT/OT will be on hold until the new year as pt has upcoming eye surgery scheduled. Pt requested to wait for ST evaluation when she returns for PT/OT. Pt aware of need for resume orders (per [facility]) and to call back to schedule her appointments.   Janann Colonel, MA CCC-SLP 02/06/2021, 12:23 PM  [facility] [address] [place], [place], [address] Phone: [phone]   Fax:  [phone]

## 2021-02-09 ENCOUNTER — Ambulatory Visit: Payer: BC Managed Care – PPO | Admitting: Occupational Therapy

## 2021-02-11 ENCOUNTER — Ambulatory Visit: Payer: BC Managed Care – PPO | Admitting: Occupational Therapy

## 2021-02-11 ENCOUNTER — Ambulatory Visit: Payer: BC Managed Care – PPO

## 2021-02-16 ENCOUNTER — Encounter: Payer: Self-pay | Admitting: Obstetrics & Gynecology

## 2021-02-16 ENCOUNTER — Other Ambulatory Visit: Payer: Self-pay

## 2021-02-16 ENCOUNTER — Ambulatory Visit: Payer: BC Managed Care – PPO | Admitting: Obstetrics & Gynecology

## 2021-02-16 VITALS — BP 108/72 | HR 89 | Ht 66.75 in | Wt 267.0 lb

## 2021-02-16 DIAGNOSIS — Z308 Encounter for other contraceptive management: Secondary | ICD-10-CM | POA: Diagnosis not present

## 2021-02-16 DIAGNOSIS — Z6841 Body Mass Index (BMI) 40.0 and over, adult: Secondary | ICD-10-CM | POA: Diagnosis not present

## 2021-02-16 DIAGNOSIS — Z01419 Encounter for gynecological examination (general) (routine) without abnormal findings: Secondary | ICD-10-CM | POA: Diagnosis not present

## 2021-02-16 NOTE — Progress Notes (Signed)
Amber [name] 04/23/1970 [id number]   History:    50 y.o. G0 Accompanied by her fiance.  Recently retired Runner, broadcasting/film/video.  RP:  New (>7 yrs) patient presenting for annual gyn exam   HPI: Menses currently normal flow x 3-5 days every month.  No BTB.  LMP 01/12/2021.  No pelvic pain.  Not using contraception.  Pap Neg/HPV HR Neg 11/2020.  Had a stroke in 09/2020.  On Plavix until early October 2022.  Had vaginal bleeding most days on Plavix from July to September 2022.  Breasts normal.  Overdue for Mammo.  No Colono yet.  BMI 42.13.  Health labs with Fam MD 11/2020.  Past medical history,surgical history, family history and social history were all reviewed and documented in the EPIC chart.  Gynecologic History Patient's last menstrual period was 01/12/2021 (approximate).  Obstetric History OB History  Gravida Para Term Preterm AB Living  0 0 0 0 0 0  SAB IAB Ectopic Multiple Live Births  0 0 0 0 0     ROS: A ROS was performed and pertinent positives and negatives are included in the history.  GENERAL: No fevers or chills. HEENT: No change in vision, no earache, sore throat or sinus congestion. NECK: No pain or stiffness. CARDIOVASCULAR: No chest pain or pressure. No palpitations. PULMONARY: No shortness of breath, cough or wheeze. GASTROINTESTINAL: No abdominal pain, nausea, vomiting or diarrhea, melena or bright red blood per rectum. GENITOURINARY: No urinary frequency, urgency, hesitancy or dysuria. MUSCULOSKELETAL: No joint or muscle pain, no back pain, no recent trauma. DERMATOLOGIC: No rash, no itching, no lesions. ENDOCRINE: No polyuria, polydipsia, no heat or cold intolerance. No recent change in weight. HEMATOLOGICAL: No anemia or easy bruising or bleeding. NEUROLOGIC: No headache, seizures, numbness, tingling or weakness. PSYCHIATRIC: No depression, no loss of interest in normal activity or change in sleep pattern.     Exam:   BP 108/72   Pulse 89   Ht 5' 6.75" (1.695 m)   Wt 267 lb  (121.1 kg)   LMP 01/12/2021 (Approximate)   SpO2 97%   BMI 42.13 kg/m   Body mass index is 42.13 kg/m.  General appearance : Well developed well nourished female. No acute distress HEENT: Eyes: no retinal hemorrhage or exudates,  Neck supple, trachea midline, no carotid bruits, no thyroidmegaly Lungs: Clear to auscultation, no rhonchi or wheezes, or rib retractions  Heart: Regular rate and rhythm, no murmurs or gallops Breast:Examined in sitting and supine position were symmetrical in appearance, no palpable masses or tenderness,  no skin retraction, no nipple inversion, no nipple discharge, no skin discoloration, no axillary or supraclavicular lymphadenopathy Abdomen: no palpable masses or tenderness, no rebound or guarding Extremities: no edema or skin discoloration or tenderness  Pelvic: Vulva: Normal             Vagina: No gross lesions or discharge  Cervix: No gross lesions or discharge  Uterus  AV, normal size, shape and consistency, non-tender and mobile  Adnexa  Without masses or tenderness  Anus: Normal   Assessment/Plan:  50 y.o. female for annual exam   1. Well female exam with routine gynecological exam Menses currently normal flow x 3-5 days every month.  No BTB.  LMP 01/12/2021.  No pelvic pain.  Not using contraception.  Pap Neg/HPV HR Neg 11/2020.  Had a stroke in 09/2020.  On Plavix until early October 2022.  Had vaginal bleeding most days on Plavix from July to September 2022.  Breasts normal.  Overdue for Mammo.  No Colono yet.  BMI 42.13.  Health labs with Fam MD 11/2020.  2. Encounter for other contraceptive management Condoms recommended at this time.  Counseling on ParaGuard IUD.  Will call back for insertion if decides to use.  3. Class 3 severe obesity due to excess calories with serious comorbidity and body mass index (BMI) of 40.0 to 44.9 in adult [facility])  Low calorie/carb diet recommended.  Increase aerobic activities to 5x per week and light weight lifting  every 2 days after eye surgeries.  [name], 9:31 AM 02/16/2021

## 2021-02-19 ENCOUNTER — Encounter: Payer: BC Managed Care – PPO | Admitting: Occupational Therapy

## 2021-02-20 ENCOUNTER — Other Ambulatory Visit: Payer: Self-pay | Admitting: Family Medicine

## 2021-02-20 DIAGNOSIS — R638 Other symptoms and signs concerning food and fluid intake: Secondary | ICD-10-CM

## 2021-02-20 MED ORDER — NALTREXONE HCL 50 MG PO TABS: 50.0000 mg | ORAL_TABLET | Freq: Every day | ORAL | 2 refills | Status: DC

## 2021-02-24 ENCOUNTER — Other Ambulatory Visit: Payer: Self-pay | Admitting: *Deleted

## 2021-02-24 DIAGNOSIS — E1159 Type 2 diabetes mellitus with other circulatory complications: Secondary | ICD-10-CM

## 2021-02-24 MED ORDER — OZEMPIC (1 MG/DOSE) 4 MG/3ML ~~LOC~~ SOPN: 1.0000 mg | PEN_INJECTOR | SUBCUTANEOUS | 3 refills | Status: DC

## 2021-02-26 ENCOUNTER — Encounter
Payer: BC Managed Care – PPO | Attending: Physical Medicine & Rehabilitation | Admitting: Physical Medicine & Rehabilitation

## 2021-02-26 ENCOUNTER — Other Ambulatory Visit: Payer: Self-pay | Admitting: Physical Medicine & Rehabilitation

## 2021-02-26 ENCOUNTER — Other Ambulatory Visit: Payer: Self-pay

## 2021-02-26 ENCOUNTER — Encounter: Payer: Self-pay | Admitting: Physical Medicine & Rehabilitation

## 2021-02-26 VITALS — BP 138/83 | HR 96 | Ht 66.75 in | Wt 270.0 lb

## 2021-02-26 DIAGNOSIS — I639 Cerebral infarction, unspecified: Secondary | ICD-10-CM | POA: Insufficient documentation

## 2021-02-26 MED ORDER — OXYBUTYNIN CHLORIDE ER 10 MG PO TB24: 10.0000 mg | ORAL_TABLET | Freq: Every day | ORAL | 1 refills | Status: DC

## 2021-02-26 NOTE — Patient Instructions (Addendum)
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

## 2021-02-26 NOTE — Progress Notes (Signed)
Subjective:    Patient ID: Amber [name], female    DOB: 08/16/1970, 50 y.o.   MRN: [id number] 50 y.o. right-handed female with history of hypertension hyperlipidemia type 2 diabetes mellitus.  Per chart review she lives in an apartment in her mother's basement.  Independent prior to admission.  She has 14 steps down to the living room.  She works full-time as a Engineer, site.  Presented 09/24/2020 after being found down for an extended amount of time with right side weakness as well as facial droop.  Cranial CT scan showed abnormal hypoattenuation within the left pons and left thalamus concerning for age-indeterminate infarction.  CT angiogram head and neck no emergent large vessel occlusion.  Patient did not receive tPA.  MRI showed a 10 mm acute infarction in the left pons.  No hemorrhage or mass-effect.  Echocardiogram with ejection fraction of 60 to 65% grade 2 diastolic dysfunction.  No regional wall motion abnormalities.  Admission chemistries unremarkable except sodium 133 glucose 310 CK 1112 urinalysis positive nitrite.  Maintained on aspirin as well as Plavix for CVA prophylaxis x3 months then aspirin alone  Admit date: 10/01/2020 Discharge date: 10/15/2020 HPI Tried going back to work was not able to endure the whole day and was also not able to keep up cognitively with her processing speed. In the interval time she has had ophthalmological problems.  She had right eye cataract removal and this is doing better her left eye had a photocoagulation procedure and has plans for cataract surgery. She denies any significant pain complaints she complains of bladder control issues especially first thing in the morning.  Patient has reduced walking tolerance of approximately 12 minutes she climbs steps but does not drive.  She is here with her fianc.  She lives in 1 level home able to into the bathroom but states she has 30 steps to enter. Pain Inventory Average Pain 0 Pain Right Now 0 My pain is  no  pain  LOCATION OF PAIN  no pain  BOWEL Number of stools per week: 10 Oral laxative use No  Type of laxative na Enema or suppository use No  History of colostomy No  Incontinent Yes   BLADDER Normal In and out cath, frequency na Able to self cath  na Bladder incontinence Yes  Frequent urination No  Leakage with coughing No  Difficulty starting stream No  Incomplete bladder emptying No    Mobility use a cane use a walker how many minutes can you walk? 12 ability to climb steps?  yes do you drive?  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  no  Physicians involved in your care Any changes since last visit?  no   Family History  Problem Relation Age of Onset   Diabetes Mother    Stroke Mother    Hypertension Mother    Diabetes Father    Alcohol abuse Father    Heart attack Father    Hypertension Father    Diabetes Sister    Bipolar disorder Maternal Uncle    Social History   Socioeconomic History   Marital status: Divorced    Spouse name: Not on file   Number of children: Not on file   Years of education: Not on file   Highest education level: Not on file  Occupational History   Not on file  Tobacco  Use   Smoking status: Never   Smokeless tobacco: Never  Vaping Use   Vaping Use: Former  Substance and Sexual Activity   Alcohol use: Never   Drug use: Never   Sexual activity: Yes    Partners: Male  Other Topics Concern   Not on file  Social History Narrative   Works as Runner, broadcasting/film/video   Mother was Midwife   In committed relationship       Forensic scientist       Presenter, broadcasting at [employer]    Social Determinants of Corporate investment banker Strain: Not on [employer] Insecurity: Not on file  Transportation Needs: Not on file  Physical Activity: Not on file  Stress: Not on file  Social  Connections: Not on file   Past Surgical History:  Procedure Laterality Date   right eye surgery Right    had retinal hemorrhage   Past Medical History:  Diagnosis Date   Cerebrovascular disease    Depression    DM2 (diabetes mellitus, type 2) (HCC)    HLD (hyperlipidemia)    HTN (hypertension)    BP 138/83   Pulse 96   Ht 5' 6.75" (1.695 m)   Wt 270 lb (122.5 kg)   SpO2 97%   BMI 42.61 kg/m   Opioid Risk Score:   Fall Risk Score:  `1  Depression screen PHQ 2/9  Depression screen [facility] 2/9 02/26/2021 01/26/2021 12/29/2020 11/28/2020 10/27/2020 10/21/2020  Decreased Interest 0 0 1 0 0 0  Down, Depressed, Hopeless 0 0 1 0 - 0  PHQ - 2 Score 0 0 2 0 0 0  Altered sleeping - 0 2 2 0 1  Tired, decreased energy - 3 3 3 1 3   Change in appetite - 0 3 0 1 0  Feeling bad or failure about yourself  - 0 3 1 0 0  Trouble concentrating - 0 1 1 0 0  Moving slowly or fidgety/restless - 0 0 1 0 0  Suicidal thoughts - 0 0 0 0 0  PHQ-9 Score - 3 14 8 2 4   Difficult doing work/chores - - Very difficult Extremely dIfficult - -     Review of Systems     Objective:   Physical Exam Vitals and nursing note reviewed.  Constitutional:      Appearance: She is obese.  HENT:     Head: Normocephalic and atraumatic.  Eyes:     Extraocular Movements: Extraocular movements intact.     Conjunctiva/sclera: Conjunctivae normal.     Pupils: Pupils are equal, round, and reactive to light.  Musculoskeletal:     Cervical back: Normal range of motion.  Neurological:     Mental Status: She is alert and oriented to person, place, and time.  Psychiatric:        Mood and Affect: Mood normal.        Behavior: Behavior normal.  Motor strength is 5/5 bilateral deltoid bicep tricep grip hip flexor knee extensor ankle dorsiflexor Fine motor is mildly reduced finger to thumb opposition on the right side compared to left side.  Negative dysdiadochokinesis with rapid alternating supination pronation bilateral upper  extremities.         Assessment & Plan:  Left pontine infarct with right hemiparesis, main residual is fine motor problems.  She also has poor endurance.  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

## 2021-02-27 LAB — HM DIABETES EYE EXAM

## 2021-03-02 LAB — HM DIABETES EYE EXAM

## 2021-03-11 ENCOUNTER — Encounter: Payer: Self-pay | Admitting: Adult Health

## 2021-03-11 ENCOUNTER — Other Ambulatory Visit: Payer: Self-pay

## 2021-03-11 ENCOUNTER — Ambulatory Visit: Payer: BC Managed Care – PPO | Admitting: Adult Health

## 2021-03-11 VITALS — BP 137/75 | HR 90 | Ht 67.0 in | Wt 276.0 lb

## 2021-03-11 DIAGNOSIS — E785 Hyperlipidemia, unspecified: Secondary | ICD-10-CM

## 2021-03-11 DIAGNOSIS — I639 Cerebral infarction, unspecified: Secondary | ICD-10-CM

## 2021-03-11 DIAGNOSIS — E1165 Type 2 diabetes mellitus with hyperglycemia: Secondary | ICD-10-CM

## 2021-03-11 DIAGNOSIS — I10 Essential (primary) hypertension: Secondary | ICD-10-CM

## 2021-03-11 NOTE — Patient Instructions (Signed)
Restart therapies once able - keep up the good work!!   Restart use of CPAP as some of your day time fatigue is likely coming from your untreated sleep apnea  Continue aspirin 81 mg daily  and atorvastatin 80mg  daily  for secondary stroke prevention  Continue to follow up with PCP regarding cholesterol, blood pressure and diabetes management  Maintain strict control of hypertension with blood pressure goal below 130/90, diabetes with hemoglobin A1c goal below 7.0 % and cholesterol with LDL cholesterol (bad cholesterol) goal below 70 mg/dL.   Signs of a Stroke? Follow the BEFAST method:  Balance Watch for a sudden loss of balance, trouble with coordination or vertigo Eyes Is there a sudden loss of vision in one or both eyes? Or double vision?  Face: Ask the person to smile. Does one side of the face droop or is it numb?  Arms: Ask the person to raise both arms. Does one arm drift downward? Is there weakness or numbness of a leg? Speech: Ask the person to repeat a simple phrase. Does the speech sound slurred/strange? Is the person confused ? Time: If you observe any of these signs, call 911.    Followup in the future with me in 5 months or call earlier if needed       Thank you for coming to see at [facility]. I hope we have been able to provide you high quality care today.  You may receive a patient satisfaction survey over the next few weeks. We would appreciate your feedback and comments so that we may continue to improve ourselves and the health of our patients.

## 2021-03-11 NOTE — Progress Notes (Signed)
[facility] [address] [place]. [place] [address] [phone]       STROKE FOLLOW UP NOTE  [name] Date of Birth:  [birth year] Medical Record Number:  [id number]   Reason for Referral: stroke follow up    SUBJECTIVE:   CHIEF COMPLAINT:  Chief Complaint  Patient presents with   Follow-up    RM 2 with spouse shaun  Pt is well, she is now walking with a cane, vision has improved due to surgery. No new concerns.     HPI:   Update 03/11/2021 JM: Returns for 95-month stroke follow-up accompanied by her significant other, Shaun.  Overall stable -denies new stroke/TIA symptoms.  Reports improvement of ambulation currently using a cane outdoors -denies any recent falls.  Continues to experience fatigue and activity intolerance.  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  Reports sleeping well at night but takes naps throughout the day. She is currently enrolled in Sleep Smart trial on CPAP therapy but has not been using CPAP since she had COVID back in August - she plans on restarting. Therapies are currently on hold due to recent eye procedure - had OD cataract removal last week. She has f/u with ophthalmologist next month to further discuss treatment options for OS.  Reports tremendous improvement of OD vision.  Compliant on aspirin and atorvastatin -denies side effects. Blood pressure today 137/75. Glucose levels good. Recent A1c 9.7 down from 11.7. Direct LDL 78.  No further concerns at this time    History provided for reference purposes only Initial visit 12/10/2020 JM: Amber [name] is being seen for hospital follow up accompanied by her fianc. Reports residual gait impairment with unsteadiness and right sided weakness (RLE gets worse with increased fatigue). Use of RW outdoors - will use a cane indoors - no recent falls. She fatigues quickly after little activity. 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  She is currently taking a 2-week break from therapy because she was having difficulty adequately participating in sessions with excessive fatigue after working.  She is used to being constantly on the go so this new limitation is very frustrating.  She was previously working 3 jobs and has only been able to return back to 1 job - she does not plan on returning back to 3 jobs.  No cognitive deficits.  She questions possibility of returning back to driving.  She also reports hiccups after eating, occasional swallowing difficulties with hard or chewy foods (denies coughing or aspiration) and urge incontinence.  She does endorse overall she has been making gradual recovery.  Compliant on aspirin and Plavix as well as atorvastatin without side effects.  PCP recently increased atorvastatin from 40 mg to 80 mg daily with recent direct LDL 166.  Blood pressure today 136/86.  Routinely monitors glucose levels at home and routinely followed by endocrinology.  No further concerns at this time.  Stroke admission 09/24/2020 [name] is a 50 y.o. female with history of hypertension, DM and depression who presented on 09/24/2020 with right sided weakness.  Stroke work-up revealed acute left pontine stroke likely from symptomatic moderate distal basilar stenosis.  Imaging also showed evidence of old left thalamic infarct.  EF 60 to 65%.  LDL 107.  A1c 11.7.  Recommended DAPT for 3 weeks and aspirin alone as well as initiating atorvastatin 40 mg daily.  HTN stable.  Therapy eval's recommended CIR for residual right-sided weakness      PERTINENT IMAGING   MR BRAIN WO CONTRAST Result Date: 09/24/2020 IMPRESSION: 10 mm acute infarct of the left pons. No hemorrhage or mass effect. Electronically Signed   By: Deatra Robinson M.D.   On: 09/24/2020 23:00    CT HEAD CODE STROKE WO CONTRAST Result Date: 09/24/2020 MPRESSION: Abnormal hypoattenuation within  the left pons and left thalamus, concerning for age indeterminate infarcts that could be acute/subacute.    CT ANGIO HEAD NECK W WO CM (CODE STROKE) Result Date: 09/24/2020 IMPRESSION: 1. No emergent large vessel occlusion. 2. 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 3. Cervical carotid atherosclerosis without stenosis.     ROS:   14 system review of systems performed and negative with exception of those listed in HPI  PMH:  Past Medical History:  Diagnosis Date   Cerebrovascular disease    Depression    DM2 (diabetes mellitus, type 2) (HCC)    HLD (hyperlipidemia)    HTN (hypertension)     PSH:  Past Surgical History:  Procedure Laterality Date   right eye surgery Right    had retinal hemorrhage    Social History:  Social History   Socioeconomic History   Marital status: Divorced    Spouse name: Not on file   Number of children: Not on file   Years of education: Not on file   Highest education level: Not on file  Occupational History   Not on file  Tobacco Use   Smoking status: Never   Smokeless tobacco: Never  Vaping Use   Vaping Use: Former  Substance and Sexual Activity   Alcohol use: Never   Drug use: Never   Sexual activity: Yes    Partners: Male  Other Topics Concern   Not on file  Social History Narrative   Works as Runner, broadcasting/film/video   Mother was Midwife   In committed relationship       Transport planner    CNA       Presenter, broadcasting at [employer]    Social Determinants of Corporate investment banker Strain: Not on file  Food Insecurity: Not on file  Transportation Needs: Not on file  Physical Activity: Not on file  Stress: Not on file  Social Connections: Not on file  Intimate Partner Violence: Not on file    Family History:  Family History  Problem Relation Age of Onset   Diabetes Mother    Stroke Mother    Hypertension Mother    Diabetes Father    Alcohol abuse Father     Heart attack Father    Hypertension Father    Diabetes Sister    Bipolar disorder Maternal Uncle     Medications:   Current Outpatient Medications on File Prior to Visit  Medication Sig Dispense Refill   acetaminophen (TYLENOL) 325 MG tablet Take 2 tablets (650 mg total) by mouth every 6 (six) hours as needed for mild pain (or Fever >/= 101). 20 tablet 0   aspirin EC 81 MG EC tablet Take 1 tablet (81 mg total) by mouth daily. Swallow whole. 30 tablet 11   atorvastatin (LIPITOR) 80 MG tablet Take 1 tablet (80 mg total) by mouth at bedtime. 90 tablet 3   cyclobenzaprine (FLEXERIL) 5 MG tablet Take 1 tablet (5 mg total) by mouth 3 (three) times daily as needed for muscle spasms. 30 tablet 0  insulin aspart (NOVOLOG FLEXPEN) 100 UNIT/ML FlexPen Inject 10 Units into the skin 3 (three) times daily with meals. 15 mL 11   insulin aspart (NOVOLOG) 100 UNIT/ML injection Inject into the skin.     Insulin Glargine (BASAGLAR KWIKPEN) 100 UNIT/ML Inject 70 Units into the skin daily. 15 mL 3   Insulin Pen Needle (BD ULTRA-FINE PEN NEEDLES) 29G X 12.7MM MISC      lisinopril-hydrochlorothiazide (ZESTORETIC) 20-12.5 MG tablet Take 1 tablet by mouth daily. 90 tablet 3   naltrexone (DEPADE) 50 MG tablet Take 1 tablet (50 mg total) by mouth daily. 30 tablet 2   nystatin-triamcinolone (MYCOLOG II) cream Apply 1 application topically 2 (two) times daily. 240 g 0   oxybutynin (DITROPAN-XL) 10 MG 24 hr tablet TAKE 1 TABLET(10 MG) BY MOUTH AT BEDTIME 90 tablet 0   Semaglutide, 1 MG/DOSE, (OZEMPIC, 1 MG/DOSE,) 4 MG/3ML SOPN Inject 1 mg into the skin once a week. 9 mL 3   sertraline (ZOLOFT) 100 MG tablet Take 1 tablet (100 mg total) by mouth daily. 30 tablet 0   No current facility-administered medications on file prior to visit.    Allergies:   Allergies  Allergen Reactions   Alpha-Gal Anaphylaxis   Metformin     Other reaction(s): OTHER Other reaction(s): OTHER    Penicillins        OBJECTIVE:  Physical Exam  Vitals:   03/11/21 1458  BP: 137/75  Pulse: 90  Weight: 276 lb (125.2 kg)  Height: 5\' 7"  (1.702 m)   Body mass index is 43.23 kg/m. No results found.  General: Obese very pleasant middle-age Caucasian female, seated, in no evident distress Head: head normocephalic and atraumatic.   Neck: supple with no carotid or supraclavicular bruits Cardiovascular: regular rate and rhythm, no murmurs Musculoskeletal: no deformity Skin:  no rash/petichiae Vascular:  Normal pulses all extremities   Neurologic Exam Mental Status: Awake and fully alert. Fluent speech and language. Oriented to place and time. Recent and remote memory intact. Attention span, concentration and fund of knowledge appropriate. Mood and affect appropriate.  Cranial Nerves: Pupils equal, briskly reactive to light. Extraocular movements full without nystagmus.  Chronic right eye mild abduction palsy.  Visual fields full to confrontation. Hearing intact. Facial sensation intact.  Mild right lower facial weakness.  Tongue, palate moves normally and symmetrically.  Motor: Normal strength, bulk and tone left upper and lower extremity.  RUE: positive pronator drift; RLE: 5/5 Sensory.: intact to touch , pinprick , position and vibratory sensation.  Coordination: Rapid alternating movements normal in all extremities except slight decreased right hand dexterity. Finger-to-nose and heel-to-shin mild right-sided ataxia. Gait and Station: Arises from chair without difficulty. Stance is normal. Gait demonstrates slight decreased RLE stride length and step height with use of cane.  Tandem walk and heel toe not attempted.  Reflexes: 1+ and symmetric. Toes downgoing.         ASSESSMENT: Amber [name] is a 50 y.o. year old female with left pontine stroke likely from symptomatic moderate distal BA stenosis on 09/24/2020 presenting with right-sided weakness. Vascular risk factors include HTN, HLD, DM,  remote left thalamic stroke on imaging and obesity and OSA (enrolled in Sleep Smart study).      PLAN:  Left pontine stroke:  Residual deficit: Right-sided ataxia and gait impairment; continued fatigue and activity intolerance.  Restart therapies for hopeful ongoing recovery. Discussed restarting CPAP as untreated apnea may be contributing continue aspirin 81 mg daily and atorvastatin 80 mg daily for  secondary stroke prevention.   Discussed secondary stroke prevention measures and importance of close PCP follow up for aggressive stroke risk factor management. I have gone over the pathophysiology of stroke, warning signs and symptoms, risk factors and their management in some detail with instructions to go to the closest emergency room for symptoms of concern. HTN: BP goal <130/90.  Stable on current regimen per PCP HLD: LDL goal <70. Recent direct LDL 78 down from 166 on atorvastatin 80 mg daily per PCP DMII: A1c goal<7.0. Recent A1c 9.7 down from 11.7.  On glipizide, and insulin (aspart and glargine) per endo    Follow up in 5 months or call earlier if needed   CC:  PCP: Westley Chandler, MD    I spent 34 minutes of face-to-face and non-face-to-face time with patient and fianc.  This included previsit chart review, lab review, study review, electronic health record documentation, patient and fianc education and discussion regarding prior stroke including etiology, secondary stroke prevention measures and importance of managing stroke risk factors, residual deficits and potential further recovery and answered all other questions to patient and fianc's satisfaction  Ihor Austin, AGNP-BC  [facility] [address] [place], [place] [address]  Phone [phone] Fax [phone] Note: This document was prepared with digital dictation and possible smart phrase technology. Any transcriptional errors that result from this process are  unintentional.

## 2021-03-20 ENCOUNTER — Encounter: Payer: Self-pay | Admitting: Family Medicine

## 2021-03-20 ENCOUNTER — Other Ambulatory Visit: Payer: Self-pay | Admitting: Family Medicine

## 2021-03-20 DIAGNOSIS — B372 Candidiasis of skin and nail: Secondary | ICD-10-CM

## 2021-03-20 MED ORDER — NOVOLOG FLEXPEN RELION 100 UNIT/ML ~~LOC~~ SOPN: PEN_INJECTOR | SUBCUTANEOUS | 11 refills | Status: DC

## 2021-03-20 MED ORDER — NYSTATIN-TRIAMCINOLONE 100000-0.1 UNIT/GM-% EX CREA: 1.0000 "application " | TOPICAL_CREAM | Freq: Two times a day (BID) | CUTANEOUS | 0 refills | Status: DC

## 2021-03-29 NOTE — Progress Notes (Signed)
° ° °  SUBJECTIVE:   CHIEF COMPLAINT: follow up on medications  HPI:   Amber [name] is a 51 y.o.  with history notable for type 2 DM and CVA presenting for follow up.  Stroke Rehabilitation The patient reports she is doing well but notices being slower with everything. Reports compliance with medications. No falls, not back to driving.   Cataract Procedures  She has OS cataract removal, now 20/80 in OS. Has upcoming OD removal. With vision procedure, she has noted improvement.   Diabetes The patient reports morning blood sugars have ranged from 180-2 29.  She denies polyuria or polydipsia.  She reports naltrexone is helping.  She does not have any side effects from the semaglutide and is interested in going up on dosing.  She denies hypoglycemia.  She is not particular interested in SGLT2 inhibitor due to its potential side effects she would like to work on dietary changes and increasing her semaglutide at this point.    PERTINENT  PMH / PSH/Family/Social History : Updated and reviewed as appropriate.  OBJECTIVE:   BP (!) 142/73    Pulse 78    Wt 276 lb 9.6 oz (125.5 kg)    SpO2 100%    BMI 43.32 kg/m   Today's weight:  Last Weight  Most recent update: 03/30/2021 10:39 AM    Weight  125.5 kg (276 lb 9.6 oz)            Review of prior weights: Filed Weights   03/30/21 1038  Weight: 276 lb 9.6 oz (125.5 kg)     Cardiac: Regular rate and rhythm. Normal S1/S2. No murmurs, rubs, or gallops appreciated. Lungs: Clear bilaterally to ascultation.  Psych: Pleasant and appropriate  Increased speech fluency and cadence that is faster from prior visits. Pupils remain asymmetric with the left pupil greater in diameter than right.  Sensate on monofilament exam. ASSESSMENT/PLAN:   HTN (hypertension) Will obtain metabolic panel today.  Left pontine cerebrovascular accident (HCC) Full lipid panel to see if patient would benefit from addition of a triglyceride lowering agent such as  Vascepa  DM2 (diabetes mellitus, type 2) (HCC) Increase semaglutide to 2 mg weekly.  We discussed side effects.  We discussed with the CGM and addition of an SGLT2 inhibitor she will hold off on this time.  Foot exam completed today she is sensate on monofilament exam.  OSA (obstructive sleep apnea) Compliant with CPAP therapy.   HCM printed prescription for Tdap and Shingrix.  Otherwise she is up-to-date on healthcare maintenance except for colonoscopy.  We discussed and elected to defer this until after her second cataract procedure.  History of Stroke with resulting difficulty performing her job tasks.  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  Thus it does seem appropriate to apply for short-term disability with intent to apply for long-term permanent disability.  We will complete paperwork for student limits for patient.  At follow-up consider transition to tirzepatide.     Terisa Starr, MD  Family Medicine Teaching Service  [facility]

## 2021-03-30 ENCOUNTER — Ambulatory Visit: Payer: BC Managed Care – PPO | Admitting: Family Medicine

## 2021-03-30 ENCOUNTER — Other Ambulatory Visit: Payer: Self-pay

## 2021-03-30 ENCOUNTER — Encounter: Payer: Self-pay | Admitting: Family Medicine

## 2021-03-30 VITALS — BP 142/73 | HR 78 | Wt 276.6 lb

## 2021-03-30 DIAGNOSIS — I10 Essential (primary) hypertension: Secondary | ICD-10-CM | POA: Diagnosis not present

## 2021-03-30 DIAGNOSIS — Z23 Encounter for immunization: Secondary | ICD-10-CM | POA: Diagnosis not present

## 2021-03-30 DIAGNOSIS — E785 Hyperlipidemia, unspecified: Secondary | ICD-10-CM | POA: Diagnosis not present

## 2021-03-30 DIAGNOSIS — E1159 Type 2 diabetes mellitus with other circulatory complications: Secondary | ICD-10-CM

## 2021-03-30 DIAGNOSIS — I639 Cerebral infarction, unspecified: Secondary | ICD-10-CM

## 2021-03-30 DIAGNOSIS — G4733 Obstructive sleep apnea (adult) (pediatric): Secondary | ICD-10-CM

## 2021-03-30 MED ORDER — TETANUS-DIPHTH-ACELL PERTUSSIS 5-2.5-18.5 LF-MCG/0.5 IM SUSY: 0.5000 mL | PREFILLED_SYRINGE | Freq: Once | INTRAMUSCULAR | 0 refills | Status: AC

## 2021-03-30 MED ORDER — SHINGRIX 50 MCG/0.5ML IM SUSR: 0.5000 mL | INTRAMUSCULAR | 0 refills | Status: DC

## 2021-03-30 MED ORDER — TETANUS-DIPHTH-ACELL PERTUSSIS 5-2.5-18.5 LF-MCG/0.5 IM SUSY: 0.5000 mL | PREFILLED_SYRINGE | Freq: Once | INTRAMUSCULAR | 0 refills | Status: DC

## 2021-03-30 MED ORDER — SEMAGLUTIDE (2 MG/DOSE) 8 MG/3ML ~~LOC~~ SOPN: 2.0000 mg | PEN_INJECTOR | SUBCUTANEOUS | 3 refills | Status: DC

## 2021-03-30 NOTE — Assessment & Plan Note (Signed)
Compliant with CPAP therapy 

## 2021-03-30 NOTE — Assessment & Plan Note (Signed)
Full lipid panel to see if patient would benefit from addition of a triglyceride lowering agent such as Vascepa

## 2021-03-30 NOTE — Assessment & Plan Note (Signed)
Increase semaglutide to 2 mg weekly.  We discussed side effects.  We discussed with the CGM and addition of an SGLT2 inhibitor she will hold off on this time.  Foot exam completed today she is sensate on monofilament exam.

## 2021-03-30 NOTE — Assessment & Plan Note (Signed)
Will obtain metabolic panel today.

## 2021-03-30 NOTE — Patient Instructions (Addendum)
It was wonderful to see you today.  Please bring ALL of your medications with you to every visit.   Today we talked about:  --Please schedule a virtual visit for paperwork  --I will complete the physician certification for loans   --I will call you with lab results   --Your A1C is due in February    Thank you for choosing [facility].   Please call [phone] with any questions about today's appointment.  Please be sure to schedule follow up at the front  desk before you leave today.   Terisa Starr, MD  Family Medicine

## 2021-03-31 ENCOUNTER — Encounter: Payer: Self-pay | Admitting: Family Medicine

## 2021-03-31 ENCOUNTER — Telehealth: Payer: Self-pay | Admitting: Family Medicine

## 2021-03-31 LAB — LIPID PANEL
Chol/HDL Ratio: 3.1 ratio (ref 0.0–4.4)
Cholesterol, Total: 129 mg/dL (ref 100–199)
HDL: 42 mg/dL (ref >39)
LDL Chol Calc (NIH): 68 mg/dL (ref 0–99)
Triglycerides: 101 mg/dL (ref 0–149)
VLDL Cholesterol Cal: 19 mg/dL (ref 5–40)

## 2021-03-31 LAB — COMPREHENSIVE METABOLIC PANEL
ALT: 11 IU/L (ref 0–32)
AST: 11 IU/L (ref 0–40)
Albumin/Globulin Ratio: 2.1 (ref 1.2–2.2)
Albumin: 4.6 g/dL (ref 3.8–4.9)
Alkaline Phosphatase: 101 IU/L (ref 44–121)
BUN/Creatinine Ratio: 20 (ref 9–23)
BUN: 11 mg/dL (ref 6–24)
Bilirubin Total: 0.4 mg/dL (ref 0.0–1.2)
CO2: 24 mmol/L (ref 20–29)
Calcium: 9.5 mg/dL (ref 8.7–10.2)
Chloride: 99 mmol/L (ref 96–106)
Creatinine, Ser: 0.55 mg/dL — ABNORMAL LOW (ref 0.57–1.00)
Globulin, Total: 2.2 g/dL (ref 1.5–4.5)
Glucose: 241 mg/dL — ABNORMAL HIGH (ref 70–99)
Potassium: 4.8 mmol/L (ref 3.5–5.2)
Sodium: 138 mmol/L (ref 134–144)
Total Protein: 6.8 g/dL (ref 6.0–8.5)
eGFR: 110 mL/min/{1.73_m2} (ref >59)

## 2021-03-31 NOTE — Telephone Encounter (Signed)
Called patient as there was no forwarding information on the form.   Patient reports she would like to come by and pick this up.   A copy was left up front for pick up and a copy was made for batch scanning.

## 2021-03-31 NOTE — Telephone Encounter (Signed)
Called patient with results. All questions answered. BG elevation expected---just increased GLP1.   Terisa Starr, MD  Family Medicine Teaching Service

## 2021-04-01 ENCOUNTER — Telehealth: Payer: Self-pay | Admitting: Family Medicine

## 2021-04-01 NOTE — Telephone Encounter (Signed)
Placed in dr Irving Burton box. Amber [name] Amber [name], CMA

## 2021-04-01 NOTE — Telephone Encounter (Signed)
Pt brought in medical records to share with Dr. Manson Passey and be scanned to her records. Last OV was  03/30/21. Placed in Red team folder.

## 2021-04-02 ENCOUNTER — Ambulatory Visit: Payer: BC Managed Care – PPO

## 2021-04-02 ENCOUNTER — Encounter: Payer: Self-pay | Admitting: Occupational Therapy

## 2021-04-02 ENCOUNTER — Ambulatory Visit: Payer: BC Managed Care – PPO | Attending: Family Medicine | Admitting: Occupational Therapy

## 2021-04-02 ENCOUNTER — Other Ambulatory Visit: Payer: Self-pay

## 2021-04-02 DIAGNOSIS — R278 Other lack of coordination: Secondary | ICD-10-CM | POA: Insufficient documentation

## 2021-04-02 DIAGNOSIS — R4701 Aphasia: Secondary | ICD-10-CM | POA: Insufficient documentation

## 2021-04-02 DIAGNOSIS — R41841 Cognitive communication deficit: Secondary | ICD-10-CM | POA: Diagnosis present

## 2021-04-02 DIAGNOSIS — R2681 Unsteadiness on feet: Secondary | ICD-10-CM | POA: Insufficient documentation

## 2021-04-02 DIAGNOSIS — M6281 Muscle weakness (generalized): Secondary | ICD-10-CM | POA: Diagnosis present

## 2021-04-02 DIAGNOSIS — M79601 Pain in right arm: Secondary | ICD-10-CM | POA: Insufficient documentation

## 2021-04-02 DIAGNOSIS — I69351 Hemiplegia and hemiparesis following cerebral infarction affecting right dominant side: Secondary | ICD-10-CM | POA: Insufficient documentation

## 2021-04-02 NOTE — Telephone Encounter (Signed)
Records reviewed--included diabetic eye exam. Records placed in appropriate locations.

## 2021-04-02 NOTE — Therapy (Signed)
[facility] [address] [place], [place], [address] Phone: [phone]   Fax:  [phone]  Occupational Therapy Treatment & Discharge  Patient Details  Name: Amber [name] MRN: [id number] Date of Birth: 17-Feb-1971 Referring Provider (OT): Silvestre Mesi   Encounter Date: 04/02/2021   OT End of Session - 04/02/21 1322     Visit Number 16    Number of Visits 17    Date for OT Re-Evaluation 02/16/21    Authorization Type BCBS 2022 - [id number]    OT Start Time 1320    OT Stop Time 1346   d/c session   OT Time Calculation (min) 26 min    Equipment Utilized During Treatment flotation equipment    Activity Tolerance Patient tolerated treatment well    Behavior During Therapy WFL for tasks assessed/performed             OCCUPATIONAL THERAPY DISCHARGE SUMMARY  Visits from Start of Care: 16  Current functional level related to goals / functional outcomes: Pt progressed significant over time with occupational therapy. Pt returned today for the first visit since 11/17 where, at the time, was experiencing significant vision loss d/t poorly managed diabetes. Pt is not seeing well out of the right eye s/p surgical intervention and has improved with RUE coordination and strength.    Remaining deficits: Pt continues to be slightly unsteady on feet and with some difficulties with problem solving and multi tasking (seeing ST eval today)    Education / Equipment: HEPs for grip strength, coordination for RUE    Patient agrees to discharge. Patient goals were met. Patient is being discharged due to meeting the stated rehab goals..     Past Medical History:  Diagnosis Date   Cerebrovascular disease    Depression    DM2 (diabetes mellitus, type 2) ([place])    HLD (hyperlipidemia)    HTN (hypertension)     Past Surgical History:  Procedure Laterality Date   right eye surgery Right    had retinal hemorrhage    There were no  vitals filed for this visit.   Subjective Assessment - 04/02/21 1322     Subjective  "I feel like that doll that's being stitched back together"    Patient is accompanied by: Family member   fiance   Currently in Pain? No/denies    Pain Score 0-No pain                                    OT Short Term Goals - 01/19/21 1913       OT SHORT TERM GOAL #1   Title Patient will complete HEP designed to improve RUE coordination    Time 4    Period Weeks    Status Achieved    Target Date 12/06/20      OT SHORT TERM GOAL #2   Title Patient will complete an HEP designed to improve right grip strength    Time 4    Period Weeks    Status Achieved   red theraputty - demonstrated independent     OT SHORT TERM GOAL #3   Title Patient will demonstrate improved ability to maintain arm over head sufficient time to wet / rinse hair with hand held shower head    Time 4    Period Weeks    Status Achieved      OT SHORT TERM GOAL #  4   Title Patient will self report improved control of vertical motion when brushing teeth    Time 4    Period Weeks    Status Achieved      OT SHORT TERM GOAL #5   Title Patient will demonstrate understanding of return to work recommendations    Time 4    Period Weeks    Status Achieved   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    Time 4    Period Weeks    Status Achieved               OT Long Term Goals - 04/02/21 1325       OT LONG TERM GOAL #1   Title Patient will complete updated HEP for RUE strength and coordination    Time 8    Period Weeks    Status Achieved      OT LONG TERM GOAL #2   Title Patient will demonstrate at least a10 lb increase in right grip strength to ease opening packages, carrying items,  etc.    Baseline 35lb    Time 8    Period Weeks    Status Achieved   35 lbs RUE 12/02/20     OT LONG TERM GOAL #3   Title Patient will complete 9 hole peg test in 45 sec or less    Baseline 58 sec    Time 8    Period Weeks    Status Achieved      OT LONG TERM GOAL #4   Title Patient will demonstrate improved ability to load/unload dishwasher, place dishes in shoulder height cabinets    Time 8    Period Weeks    Status Achieved   pt reporting no dishwasher but washing dishes and putting things away with no difficulty     OT LONG TERM GOAL #5   Title Patient will demonstrate understanding of return to driving recommendations    Time 8    Period Weeks    Status Achieved   pt is seeing neurologist 9/21 to review return to driving instructions/recommendations     OT LONG TERM GOAL #6   Title With her right hand, patient will carry a hot beverage without spilling while walking 15 feet level surface    Time 8    Period Weeks    Status Achieved   pt reports completing this without difficulty 02/05/21, 04/02/21     OT LONG TERM GOAL #7   Title Patient will complete FOTO survey at discharge with d/c score of 75    Baseline FS 64, Risk adjusted 52    Time 8    Period Weeks    Status Not Met   pt scored 70 at discharge                  Plan - 04/02/21 1346     Clinical Impression Statement Pt returns s/p surgery for R eye and last session on 11/17. Pt has met all goals upon goal check and is now ready for discharge from OT.    OT Occupational Profile and History Detailed Assessment- Review of Records and additional review of physical, cognitive, psychosocial history related to current functional performance    Occupational performance deficits (Please  refer to evaluation for details): ADL's;IADL's;Work;Rest and Sleep;Leisure    Body Structure / Function / Physical Skills ADL;Strength;Balance;Dexterity;GMC;Pain;Tone;Body mechanics;UE functional  use;Endurance;IADL;Coordination;Mobility;FMC    Rehab Potential Excellent    Clinical Decision Making Several treatment options, min-mod task modification necessary    Comorbidities Affecting Occupational Performance: May have comorbidities impacting occupational performance    Modification or Assistance to Complete Evaluation  No modification of tasks or assist necessary to complete eval    OT Frequency 2x / week    OT Duration 8 weeks    OT Treatment/Interventions Self-care/ADL training;DME and/or AE instruction;Splinting;Balance training;Aquatic Therapy;Therapeutic activities;Cognitive remediation/compensation;Therapeutic exercise;Neuromuscular education;Functional Mobility Training;Visual/perceptual remediation/compensation;Patient/family education;Manual Therapy;Electrical Stimulation    Plan OT discharge    Consulted and Agree with Plan of Care Patient;Family member/caregiver    Family Member Consulted fiance Sean             Patient will benefit from skilled therapeutic intervention in order to improve the following deficits and impairments:   Body Structure / Function / Physical Skills: ADL, Strength, Balance, Dexterity, GMC, Pain, Tone, Body mechanics, UE functional use, Endurance, IADL, Coordination, Mobility, FMC       Visit Diagnosis: Muscle weakness (generalized)  Pain in right arm  Other lack of coordination  Hemiplegia and hemiparesis following cerebral infarction affecting right dominant side (HCC)  Unsteadiness on feet    Problem List Patient Active Problem List   Diagnosis Date Noted   OSA (obstructive sleep apnea) 03/30/2021   Morbid obesity ([facility]) 12/29/2020   Left pontine cerebrovascular accident ([facility]) 10/01/2020   HTN (hypertension)    DM2 (diabetes mellitus, type 2) ([facility])    HLD (hyperlipidemia)    Depression     Zachery Conch, OT 04/02/2021, 1:51 PM  [facility] [address]  [place], [place], [address] Phone: [phone]   Fax:  [phone]  Name: Amber [name] MRN: [id number] Date of Birth: [birth year]

## 2021-04-03 NOTE — Therapy (Signed)
[facility] [address] [place], [place], [address] Phone: [phone]   Fax:  [phone]  Speech Language Pathology Evaluation  Patient Details  Name: Amber [name] MRN: [id number] Date of Birth: 10-24-1970 Referring Provider (SLP): Westley Chandler, MD   Encounter Date: 04/02/2021   End of Session - 04/02/21 1425     Visit Number 1    Number of Visits 13    Date for SLP Re-Evaluation 06/25/21    Authorization Type BCBS    SLP Start Time 1403    SLP Stop Time  1445    SLP Time Calculation (min) 42 min    Activity Tolerance Patient tolerated treatment well             Past Medical History:  Diagnosis Date   Cerebrovascular disease    Depression    DM2 (diabetes mellitus, type 2) (HCC)    HLD (hyperlipidemia)    HTN (hypertension)     Past Surgical History:  Procedure Laterality Date   right eye surgery Right    had retinal hemorrhage    There were no vitals filed for this visit.       SLP Evaluation [facility] - 04/02/21 1414       SLP Visit Information   SLP Received On 01/08/21   postponed due to eye surgery   Referring Provider (SLP) Westley Chandler, MD    Onset Date July 2022    Medical Diagnosis Left pontine cerebrovascular accident      Subjective   Subjective "It takes me forever to recall things. It's hard to communicate"    Patient/Family Stated Goal To determine underlying deficits and optimize overall quality of life      Pain Assessment   Currently in Pain? No/denies      General Information   HPI Amber [name] is 51 yo female with hx of left pontine CVA on 09-24-20. Pt was referred by OP OT as pt expressed and demonstrated difficulty and deficits with higher level cognition, problem solving, dual tasking and organizational skills and believed pt would benefit from a ST evaluation.    Mobility Status walked with quad cane - on PT      Balance Screen   Has the patient fallen in the past  6 months Yes    How many times? 1 - tripped over a cord    Has the patient had a decrease in activity level because of a fear of falling?  No    Is the patient reluctant to leave their home because of a fear of falling?  No      Prior Functional Status   Cognitive/Linguistic Baseline Within functional limits    Type of Home House     Lives With Family    Available Support Family    Vocation On disability   school teacher     Cognition   Overall Cognitive Status Impaired/Different from baseline    Area of Impairment Memory;Attention;Problem solving    Current Attention Level Divided    Attention Comments reported difficulty multitasking during classroom activities    Memory Decreased short-term memory    Memory Comments reduced recall of conversations, readings, and previously well known information    Problem Solving Slow processing;Difficulty sequencing;Requires verbal cues    Problem Solving Comments difficulty correcting errors on Symbol Trails task during CLQT     Auditory Comprehension   Overall Auditory Comprehension Impaired    Conversation Moderately complex   required rephrasing  and repetition x1 in conversation to ensure comprehension   Interfering Components Attention;Processing speed;Working Haematologist of Expression Verbal      Verbal Expression   Overall Verbal Expression Impaired    Initiation No impairment    Level of Generative/Spontaneous Verbalization Conversation    Naming Impairment    Responsive 76-100% accurate    Confrontation 75-100% accurate    Divergent 50-74% accurate    Interfering Components Attention    Other Verbal Expression Comments "I know what I want to say but I can't get it out"      Oral Motor/Sensory Function   Overall Oral Motor/Sensory Function Appears within functional limits for tasks assessed      Motor Speech   Overall Motor Speech Appears within functional limits for tasks assessed       Standardized Assessments   Standardized Assessments  Cognitive Linguistic Quick Test   initiated; to be completed next time due to time constraints     Cognitive Linguistic Quick Test (Ages 18-69)   Memory Mild    Language WNL    Severity Rating Total 7    Composite Severity Rating 7                             SLP Education - 04/02/21 1424     Education Details eval results, possible goals    Person(s) Educated Patient;Spouse    Methods Explanation    Comprehension Verbalized understanding;Need further instruction              SLP Short Term Goals - 04/02/21 1733       SLP SHORT TERM GOAL #1   Title Pt will complete CLQT and PROM in first ST session    Time 2    Period Weeks    Status New      SLP SHORT TERM GOAL #2   Title Pt will verbalize implementation of 2 memory/attention compensations to aid completion of daily tasks given occasional min A over 2 sessions    Time 4    Period Weeks    Status New      SLP SHORT TERM GOAL #3   Title Pt will utilize comprehension/processing strategies in 10+ minute conversation to optimize engagement and understanding given occasional min A over 2 sessions    Time 4    Period Weeks    Status New      SLP SHORT TERM GOAL #4   Title Pt will complete mod complex naming tasks with 80% accuracy given rare min A over 2 sessions    Time 4    Period Weeks    Status New      SLP SHORT TERM GOAL #5   Title Pt will use word finding compensations in 10+ minute conversation given occasional min A over 2 sessions    Time 4    Period Weeks    Status New              SLP Long Term Goals - 04/03/21 0813       SLP LONG TERM GOAL #1   Title Pt will verbalize implementation of 4 memory/attention compensations to aid completion of daily tasks given rare min A over 2 sessions    Time 8    Period Weeks    Status New      SLP LONG TERM GOAL #2   Title Pt will  utilize comprehension/processing strategies in 20+  minute conversation to optimize engagement and understanding given rare min A over 2 sessions    Time 12    Period Weeks    Status New      SLP LONG TERM GOAL #3   Title Pt will use word finding compensations in 20+ minute conversation given rare min A over 2 sessions    Time 12    Period Weeks    Status New              Plan - 04/02/21 1715     Clinical Impression Statement "Amber [name]" was referred by OPOT as pt was experiencing high level cognitive deficits s/p CVA in July 2022. Pt was accompanied by her fiance, Shaun. 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 Pt briefly returned to teaching but was unable to effectively perform her job secondary to cognitive and communication changes and is no longer teaching at this time. Pt described various cognitive communication challenges with students. Her fiance indicated changes in her communication and processing as she was very quick witted prior to the stroke. Pt stated "it takes forever to recall things." Word finding episodes also reported, as pt stated "I know what I want to say but I can't get it out." Shaun often assists with identifying the targeted word with no compensations effectively being utilized at this time. SLP initiated Cognitive Linguistic Quick Test (CLQT) today and will complete next session due to time constraints. Mild memory deficits revealed. Pt stated "I forgot the instructions" during the symbol trails task. Visual inattention also noted during task. Pt identified error (missed symbol towards top of page) but unable to correct the error within time frame. Delayed processing also noted during design memory subtest, in which pt only identified 3/6 symbols within the time frame. Language subtest was WNL; however, pt exhibited difficulty during generative naming subtest (<WNL: 16 animals & 13 "m" words). Pt also  endorsed she independently self-corrected paraphasia during convergent naming task (thermometer for train). Pt expressed concerns with cognitive communication changes impacting her daily functioning, including her ability to recall previous conversations and converse as effectively. I recommend skilled ST intervention to address cogntive communication and aphasia to optimize return to baseline and improve QOL.    Speech Therapy Frequency 1x /week   Pt request   Duration 12 weeks    Treatment/Interventions Multimodal communcation approach;Cognitive reorganization;SLP instruction and feedback;Internal/external aids;Compensatory techniques;Language facilitation;Functional tasks;Patient/family education;Compensatory strategies    Potential to Achieve Goals Good    Consulted and Agree with Plan of Care Family member/caregiver;Patient             Patient will benefit from skilled therapeutic intervention in order to improve the following deficits and impairments:   Cognitive communication deficit  Aphasia    Problem List Patient Active Problem List   Diagnosis Date Noted   OSA (obstructive sleep apnea) 03/30/2021   Morbid obesity (HCC) 12/29/2020   Left pontine cerebrovascular accident (HCC) 10/01/2020   HTN (hypertension)    DM2 (diabetes mellitus, type 2) (HCC)    HLD (hyperlipidemia)    Depression     Janann ColonelKatherine V Orvin Netter, CCC-SLP 04/03/2021, 8:38 AM  [facility] [address] [place], [place], [address] Phone: [phone]   Fax:  [phone]  Name: Algis GreenhouseSabrina [name] MRN: [id number] Date of Birth: 07/13/1970

## 2021-04-08 ENCOUNTER — Other Ambulatory Visit: Payer: Self-pay

## 2021-04-08 ENCOUNTER — Ambulatory Visit: Payer: BC Managed Care – PPO

## 2021-04-08 DIAGNOSIS — R4701 Aphasia: Secondary | ICD-10-CM

## 2021-04-08 DIAGNOSIS — R41841 Cognitive communication deficit: Secondary | ICD-10-CM

## 2021-04-08 DIAGNOSIS — M6281 Muscle weakness (generalized): Secondary | ICD-10-CM | POA: Diagnosis not present

## 2021-04-08 NOTE — Therapy (Signed)
[facility] [address] [place], [place], [address] Phone: [phone]   Fax:  [phone]  Speech Language Pathology Treatment  Patient Details  Name: Amber [name] MRN: [id number] Date of Birth: 05-Dec-1970 Referring Provider (SLP): Westley Chandler, MD   Encounter Date: 04/08/2021   End of Session - 04/08/21 1028     Visit Number 2    Number of Visits 13    Date for SLP Re-Evaluation 06/25/21    Authorization Type BCBS    SLP Start Time [phone]   pt arrived late   SLP Stop Time  1015    SLP Time Calculation (min) 39 min    Activity Tolerance Patient tolerated treatment well             Past Medical History:  Diagnosis Date   Cerebrovascular disease    Depression    DM2 (diabetes mellitus, type 2) (HCC)    HLD (hyperlipidemia)    HTN (hypertension)     Past Surgical History:  Procedure Laterality Date   right eye surgery Right    had retinal hemorrhage    There were no vitals filed for this visit.   Subjective Assessment - 04/08/21 0937     Subjective "everyone was out and about this morning"    Currently in Pain? No/denies                   ADULT SLP TREATMENT - 04/08/21 0938       General Information   Behavior/Cognition Alert;Cooperative;Pleasant mood      Treatment Provided   Treatment provided Cognitive-Linquistic      Cognitive-Linquistic Treatment   Treatment focused on Cognition;Aphasia    Skilled Treatment Pt arrived late due to traffic. Pt reported reduced attention while reading (understood main concept but missed key details during initial reading). CLQT completed today (maze and design generation), with the scores indicating WNL attention (low normal), executive function (borderline mild), and language. Mild memory and visuospatial skills indicated. Frustration reported while completing remaining subtests. Cognitive Function PROM also completed today with score of 67. Pt  rated she "very often" experiences difficulty with concentrating, making decisions, saying words on the "tip of my tongue," forgetting why she entered a room, and working really hard to pay attention. Pt previously loved to read and write with HEP provided to target naming and writing. Handout provided re: memory and attention compensations, in which pt is to summarize key details and verbally present possible strategies to implement at home.      Assessment / Recommendations / Plan   Plan Continue with current plan of care      Progression Toward Goals   Progression toward goals Progressing toward goals              SLP Education - 04/08/21 1028     Education Details CLQT/PROM results, HEP    Person(s) Educated Patient;Spouse    Methods Explanation;Demonstration;Handout    Comprehension Verbalized understanding;Returned demonstration;Need further instruction              SLP Short Term Goals - 04/08/21 0942       SLP SHORT TERM GOAL #1   Title Pt will complete CLQT and PROM in first ST session    Baseline CF: 67    Status Achieved      SLP SHORT TERM GOAL #2   Title Pt will verbalize implementation of 2 memory/attention compensations to aid completion of daily tasks given occasional min  A over 2 sessions    Time 4    Period Weeks    Status On-going      SLP SHORT TERM GOAL #3   Title Pt will utilize comprehension/processing strategies in 10+ minute conversation to optimize engagement and understanding given occasional min A over 2 sessions    Time 4    Period Weeks    Status On-going      SLP SHORT TERM GOAL #4   Title Pt will complete mod complex naming tasks with 80% accuracy given rare min A over 2 sessions    Time 4    Period Weeks    Status On-going      SLP SHORT TERM GOAL #5   Title Pt will use word finding compensations in 10+ minute conversation given occasional min A over 2 sessions    Time 4    Period Weeks    Status On-going               SLP Long Term Goals - 04/08/21 3976       SLP LONG TERM GOAL #1   Title Pt will verbalize implementation of 4 memory/attention compensations to aid completion of daily tasks given rare min A over 2 sessions    Time 8    Period Weeks    Status On-going      SLP LONG TERM GOAL #2   Title Pt will utilize comprehension/processing strategies in 20+ minute conversation to optimize engagement and understanding given rare min A over 2 sessions    Time 12    Period Weeks    Status On-going      SLP LONG TERM GOAL #3   Title Pt will use word finding compensations in 20+ minute conversation given rare min A over 2 sessions    Time 12    Period Weeks    Status On-going              Plan - 04/08/21 1028     Clinical Impression Statement "[name]" was referred by OPOT as pt was experiencing high level cognitive deficits s/p CVA in July 2022. Pt was accompanied by her fiance, Shaun. Changes in processing, communication, and short term recall reported. CLQT and PROM completed today, which revealed significant difficulty with functional recall, attention, and word finding. SLP provided handouts for attention and memroy strategies and HEP for naming/writing. I recommend skilled ST intervention to address cogntive communication and aphasia to optimize return to baseline and improve QOL.    Speech Therapy Frequency 1x /week   Pt request   Duration 12 weeks    Treatment/Interventions Multimodal communcation approach;Cognitive reorganization;SLP instruction and feedback;Internal/external aids;Compensatory techniques;Language facilitation;Functional tasks;Patient/family education;Compensatory strategies    Potential to Achieve Goals Good    Consulted and Agree with Plan of Care Family member/caregiver;Patient             Patient will benefit from skilled therapeutic intervention in order to improve the following deficits and impairments:   Cognitive communication deficit  Aphasia    Problem  List Patient Active Problem List   Diagnosis Date Noted   OSA (obstructive sleep apnea) 03/30/2021   Morbid obesity (HCC) 12/29/2020   Left pontine cerebrovascular accident (HCC) 10/01/2020   HTN (hypertension)    DM2 (diabetes mellitus, type 2) (HCC)    HLD (hyperlipidemia)    Depression     Janann Colonel, CCC-SLP 04/08/2021, 10:30 AM  [facility] [address] [place], [place],  [address] Phone: [phone]   Fax:  [phone]   Name: Amber [name] MRN: [id number] Date of Birth: 10/21/1970

## 2021-04-15 ENCOUNTER — Ambulatory Visit: Payer: BC Managed Care – PPO

## 2021-04-15 ENCOUNTER — Other Ambulatory Visit: Payer: Self-pay

## 2021-04-15 ENCOUNTER — Encounter: Payer: Self-pay | Admitting: Family Medicine

## 2021-04-15 DIAGNOSIS — R4701 Aphasia: Secondary | ICD-10-CM

## 2021-04-15 DIAGNOSIS — R41841 Cognitive communication deficit: Secondary | ICD-10-CM

## 2021-04-15 DIAGNOSIS — M6281 Muscle weakness (generalized): Secondary | ICD-10-CM | POA: Diagnosis not present

## 2021-04-15 NOTE — Therapy (Signed)
[facility] [address] [place], [place], [address] Phone: [phone]   Fax:  [phone]  Speech Language Pathology Treatment  Patient Details  Name: Amber [name] MRN: [id number] Date of Birth: 09-26-1970 Referring Provider (SLP): Martyn Malay, MD   Encounter Date: 04/15/2021   End of Session - 04/15/21 1023     Visit Number 3    Number of Visits 13    Date for SLP Re-Evaluation 06/25/21    Authorization Type BCBS    SLP Start Time 59    SLP Stop Time  1100    SLP Time Calculation (min) 40 min    Activity Tolerance Patient tolerated treatment well             Past Medical History:  Diagnosis Date   Cerebrovascular disease    Depression    DM2 (diabetes mellitus, type 2) ([place])    HLD (hyperlipidemia)    HTN (hypertension)     Past Surgical History:  Procedure Laterality Date   right eye surgery Right    had retinal hemorrhage    There were no vitals filed for this visit.   Subjective Assessment - 04/15/21 1020     Subjective "it is my second day independent"    Currently in Pain? No/denies                   ADULT SLP TREATMENT - 04/15/21 1021       General Information   Behavior/Cognition Alert;Cooperative;Pleasant mood      Treatment Provided   Treatment provided Cognitive-Linquistic      Cognitive-Linquistic Treatment   Treatment focused on Cognition;Aphasia    Skilled Treatment Pt returned with HEP completed. Pt verbally summarized personally relevant/applicable memory and attention strategies that would/have been helpful since last ST session. External strategies such as making lists, using calendar, and utilizing same location/placement for items have been effective. Internal strategies such as clarification, repetition, categorization, personal meaning, and PQRST for auditory and reading comprehension have been effective. Reduced sustained attention and difficulty with  topic maintenance reported, in which pt inconsistently aware. Awareness of needed for cognitive breaks reported (max: 30 mins to an hour). In review of short stories with 10 targeted words, pt able to identify missed words x2 and reformat writing to include words without cues. For mod complex divergent naming task, SLP instructed alternative strategies to aid word finding (categories, alphabetical), which was effective.      Assessment / Recommendations / Plan   Plan Continue with current plan of care      Progression Toward Goals   Progression toward goals Progressing toward goals              SLP Education - 04/15/21 1111     Education Details memory/attention/comprehension strateiges, energy conservation strategies, naming HEP    Person(s) Educated Patient    Methods Explanation;Demonstration;Handout;Verbal cues    Comprehension Verbalized understanding;Returned demonstration;Need further instruction              SLP Short Term Goals - 04/15/21 1029       SLP SHORT TERM GOAL #1   Title Pt will complete CLQT and PROM in first ST session    Baseline CF: 67    Status Achieved      SLP SHORT TERM GOAL #2   Title Pt will verbalize implementation of 2 memory/attention compensations to aid completion of daily tasks given occasional min A over 2 sessions    Baseline  04-15-21    Time 3    Period Weeks    Status On-going      SLP SHORT TERM GOAL #3   Title Pt will utilize comprehension/processing strategies in 10+ minute conversation to optimize engagement and understanding given occasional min A over 2 sessions    Baseline 04-15-21    Time 3    Period Weeks    Status On-going      SLP SHORT TERM GOAL #4   Title Pt will complete mod complex naming tasks with 80% accuracy given rare min A over 2 sessions    Baseline 04-15-21    Time 3    Period Weeks    Status On-going      SLP SHORT TERM GOAL #5   Title Pt will use word finding compensations in 10+ minute conversation  given occasional min A over 2 sessions    Time 3    Period Weeks    Status On-going              SLP Long Term Goals - 04/15/21 1029       SLP LONG TERM GOAL #1   Title Pt will verbalize implementation of 4 memory/attention compensations to aid completion of daily tasks given rare min A over 2 sessions    Time 7    Period Weeks    Status On-going      SLP LONG TERM GOAL #2   Title Pt will utilize comprehension/processing strategies in 20+ minute conversation to optimize engagement and understanding given rare min A over 2 sessions    Time 11    Period Weeks    Status On-going      SLP LONG TERM GOAL #3   Title Pt will use word finding compensations in 20+ minute conversation given rare min A over 2 sessions    Time 11    Period Weeks    Status On-going              Plan - 04/15/21 1025     Clinical Impression Statement "Amber [name]" was referred by [facility]OT as pt was experiencing high level cognitive deficits s/p CVA in July 2022. Per written handouts, pt able to identify and verbalize personally relevant attention, memory, and comprehension strategies to aid daily functioning. Occasional min cues required to optimize word finding and implement strategies for mod complex naming tasks. I recommend skilled ST intervention to address cogntive communication and aphasia to optimize return to baseline and improve QOL.    Speech Therapy Frequency 1x /week   Pt request   Duration 12 weeks    Treatment/Interventions Multimodal communcation approach;Cognitive reorganization;SLP instruction and feedback;Internal/external aids;Compensatory techniques;Language facilitation;Functional tasks;Patient/family education;Compensatory strategies    Potential to Achieve Goals Good    Consulted and Agree with Plan of Care Family member/caregiver;Patient             Patient will benefit from skilled therapeutic intervention in order to improve the following deficits and impairments:   Cognitive  communication deficit  Aphasia    Problem List Patient Active Problem List   Diagnosis Date Noted   OSA (obstructive sleep apnea) 03/30/2021   Morbid obesity ([place]) 12/29/2020   Left pontine cerebrovascular accident ([place]) 10/01/2020   HTN (hypertension)    DM2 (diabetes mellitus, type 2) ([facility])    HLD (hyperlipidemia)    Depression     Alinda Deem, CCC-SLP 04/15/2021, 11:13 AM  [facility] [address] [place], [place], [address]  Phone: [phone]   Fax:  [phone]   Name: Amber [name] MRN: [id number] Date of Birth: 02-20-1971

## 2021-04-23 ENCOUNTER — Other Ambulatory Visit: Payer: Self-pay

## 2021-04-23 ENCOUNTER — Ambulatory Visit: Payer: BC Managed Care – PPO | Attending: Family Medicine

## 2021-04-23 DIAGNOSIS — R4701 Aphasia: Secondary | ICD-10-CM | POA: Insufficient documentation

## 2021-04-23 DIAGNOSIS — R41841 Cognitive communication deficit: Secondary | ICD-10-CM | POA: Diagnosis not present

## 2021-04-23 NOTE — Therapy (Addendum)
[facility] [address] [place], [place], [address] Phone: [phone]   Fax:  [phone]  Speech Language Pathology Treatment  Patient Details  Name: Amber [name] MRN: [id number] Date of Birth: 20-Jun-1970 Referring Provider (SLP): Westley Chandler, MD   Encounter Date: 04/23/2021   End of Session - 04/23/21 1027     Visit Number 4    Number of Visits 13    Date for SLP Re-Evaluation 06/25/21    Authorization Type BCBS    SLP Start Time 1026   pt arrived late   SLP Stop Time  1100    SLP Time Calculation (min) 34 min    Activity Tolerance Patient tolerated treatment well             Past Medical History:  Diagnosis Date   Cerebrovascular disease    Depression    DM2 (diabetes mellitus, type 2) ([facility])    HLD (hyperlipidemia)    HTN (hypertension)     Past Surgical History:  Procedure Laterality Date   right eye surgery Right    had retinal hemorrhage    There were no vitals filed for this visit.   Subjective Assessment - 04/23/21 1027     Subjective "I came yesterday and I was actually early"    Currently in Pain? No/denies                   ADULT SLP TREATMENT - 04/23/21 1029       General Information   Behavior/Cognition Alert;Cooperative;Pleasant mood      Treatment Provided   Treatment provided Cognitive-Linquistic      Cognitive-Linquistic Treatment   Treatment focused on Cognition;Aphasia    Skilled Treatment Pt endorsed she arrived to clinic yesterday for session; however, ST session was scheduled today. When SLP inquired about solution (as this has occurred before), pt verbalized she should check her MyChart more frequently. Pt completed HEP with occasional errors identified by family member. Pt able to correct dysnomia with rare min A. Episode of word finding x1 reported, in which pt able to identify targeted word with extra time (20-30 seconds). Occasional tangential  responses exhibited, in which pt appears more aware when deviations in attention occur and can correct in more timely fashing. SLP provided HEP to targeting naming and spelling, as this has been a change.      Assessment / Recommendations / Plan   Plan Continue with current plan of care      Progression Toward Goals   Progression toward goals Progressing toward goals              SLP Education - 04/23/21 1220     Education Details external aids, attention, extra processing time    Person(s) Educated Patient    Methods Explanation;Demonstration;Handout    Comprehension Verbalized understanding;Returned demonstration;Need further instruction              SLP Short Term Goals - 04/23/21 1222       SLP SHORT TERM GOAL #1   Title Pt will complete CLQT and PROM in first ST session    Baseline CF: 67    Status Achieved      SLP SHORT TERM GOAL #2   Title Pt will verbalize implementation of 2 memory/attention compensations to aid completion of daily tasks given occasional min A over 2 sessions    Baseline 04-15-21    Time 2    Period Weeks    Status On-going  SLP SHORT TERM GOAL #3   Title Pt will utilize comprehension/processing strategies in 10+ minute conversation to optimize engagement and understanding given occasional min A over 2 sessions    Baseline 04-15-21    Time 2    Period Weeks    Status On-going      SLP SHORT TERM GOAL #4   Title Pt will complete mod complex naming tasks with 80% accuracy given rare min A over 2 sessions    Baseline 04-15-21, 04-23-21    Status Achieved      SLP SHORT TERM GOAL #5   Title Pt will use word finding compensations in 10+ minute conversation given occasional min A over 2 sessions    Baseline 04-23-21    Time 2    Period Weeks    Status On-going              SLP Long Term Goals - 04/23/21 1028       SLP LONG TERM GOAL #1   Title Pt will verbalize implementation of 4 memory/attention compensations to aid completion  of daily tasks given rare min A over 2 sessions    Time 6    Period Weeks    Status On-going      SLP LONG TERM GOAL #2   Title Pt will utilize comprehension/processing strategies in 20+ minute conversation to optimize engagement and understanding given rare min A over 2 sessions    Time 10    Period Weeks    Status On-going      SLP LONG TERM GOAL #3   Title Pt will use word finding compensations in 20+ minute conversation given rare min A over 2 sessions    Time 10    Period Weeks    Status On-going              Plan - 04/23/21 1028     Clinical Impression Statement "Amber [name]" was referred by OPOT as pt was experiencing high level cognitive deficits s/p CVA in July 2022. Pt exhibits improvements in verbal expression, recall, and attention with rare to occasional cues required on mod complex structured tasks. I recommend skilled ST intervention to address cogntive communication and aphasia to optimize return to baseline and improve QOL.    Speech Therapy Frequency 1x /week   Pt request   Duration 12 weeks    Treatment/Interventions Multimodal communcation approach;Cognitive reorganization;SLP instruction and feedback;Internal/external aids;Compensatory techniques;Language facilitation;Functional tasks;Patient/family education;Compensatory strategies    Potential to Achieve Goals Good    Consulted and Agree with Plan of Care Family member/caregiver;Patient             Patient will benefit from skilled therapeutic intervention in order to improve the following deficits and impairments:   Cognitive communication deficit  Aphasia    Problem List Patient Active Problem List   Diagnosis Date Noted   OSA (obstructive sleep apnea) 03/30/2021   Morbid obesity ([facility]) 12/29/2020   Left pontine cerebrovascular accident ([facility]) 10/01/2020   HTN (hypertension)    DM2 (diabetes mellitus, type 2) ([facility])    HLD (hyperlipidemia)    Depression     Amber [name],  CCC-SLP 04/23/2021, 1:03 PM  [facility] [address] [place], [place], [address] Phone: [phone]   Fax:  [phone]   Name: Amber [name] MRN: [id number] Date of Birth: 16-Apr-1970

## 2021-04-27 ENCOUNTER — Encounter: Payer: Self-pay | Admitting: Family Medicine

## 2021-04-27 ENCOUNTER — Ambulatory Visit: Payer: BC Managed Care – PPO | Admitting: Family Medicine

## 2021-04-27 ENCOUNTER — Other Ambulatory Visit: Payer: Self-pay

## 2021-04-27 ENCOUNTER — Telehealth: Payer: Self-pay | Admitting: Family Medicine

## 2021-04-27 VITALS — BP 130/67 | HR 89 | Wt 278.0 lb

## 2021-04-27 DIAGNOSIS — E1159 Type 2 diabetes mellitus with other circulatory complications: Secondary | ICD-10-CM

## 2021-04-27 DIAGNOSIS — I10 Essential (primary) hypertension: Secondary | ICD-10-CM | POA: Diagnosis not present

## 2021-04-27 DIAGNOSIS — E785 Hyperlipidemia, unspecified: Secondary | ICD-10-CM

## 2021-04-27 LAB — POCT GLYCOSYLATED HEMOGLOBIN (HGB A1C): HbA1c, POC (controlled diabetic range): 9.4 % — AB (ref 0.0–7.0)

## 2021-04-27 MED ORDER — BASAGLAR KWIKPEN 100 UNIT/ML ~~LOC~~ SOPN: 35.0000 [IU] | PEN_INJECTOR | Freq: Two times a day (BID) | SUBCUTANEOUS | 3 refills | Status: DC

## 2021-04-27 NOTE — Telephone Encounter (Signed)
Called patient and informed that paperwork has been completed.   Section C still needs to be completed by patient. Patient requests that forms be placed at front desk for pick up.   Copy made and placed in batch scanning.   Original at front desk for pick up.   Veronda Prude, RN

## 2021-04-27 NOTE — Progress Notes (Signed)
° ° °  SUBJECTIVE:   CHIEF COMPLAINT: diabetes check  HPI:   Amber [name] is a 51 y.o.  with history notable for type 2 diabetes, hypertension, obesity and history of cerebrovascular disease with residual right-sided weakness presenting for diabetes.  She is scheduled to undergo another eye procedure upcoming.  She is back to driving.  She continues to have difficulties with speech in her right hand.  She is unable to sustain attention for prolonged period.  Patient reports compliance with her medications.  She denies headaches or chest pain.  No new weakness or speech changes.  Patient reports compliance with her diabetes medications.  She is intolerant of metformin has severe GI side effects from this.  She is on maximal dose of semaglutide.  She takes 70 units of insulin each day.  She takes this at once.  Her morning blood sugars range from 130s to 150s as high as 170s to 180s.  Her A1c today is elevated.  She is determined to go back to [employer] which is just 4 miles from her house.    PERTINENT  PMH / PSH/Family/Social History : Updated and reviewed as appropriate  OBJECTIVE:   BP 130/67    Pulse 89    Wt 278 lb (126.1 kg)    SpO2 97%    BMI 43.54 kg/m   Today's weight:  Last Weight  Most recent update: 04/27/2021 10:27 AM    Weight  126.1 kg (278 lb)            Review of prior weights: Filed Weights   04/27/21 1026  Weight: 278 lb (126.1 kg)     Cardiac: Regular rate and rhythm. Normal S1/S2. No murmurs, rubs, or gallops appreciated. Lungs: Clear bilaterally to ascultation.  Psych: Pleasant and appropriate    ASSESSMENT/PLAN:   HTN (hypertension) At goal continue current therapy.  DM2 (diabetes mellitus, type 2) ([facility]) A1c well above goal.  We discussed at length.  Recommended consideration of tirzepatide.  Unclear if insulin is effective at the given dose at 1 time.  Discussed.  Split into 35 and 35 units.  Follow-up with pharmacy team.  We previously  discussed addition of an SGLT2 inhibitor.  The patient has a history of some urinary incontinence is on oxybutynin for this.  Additionally she has a history of Candida infections.  Would be hesitant to start this medication.  Morbid obesity ([facility]) Continue semaglutide. Encouraged increased activity--improved with better vision recently    Healthcare maintenance items, she reports she got her Shingrix and tetanus vaccine.  She will bring this documentation to follow-up visits.  Follow-up in 1 month to further titrate medications.  She has follow-up with the pharmacy team.  We will complete forms for short-term disability.  Patient continues to have difficulty with deficits as described above.    Terisa Starr, MD  Family Medicine Teaching Service  [facility]

## 2021-04-27 NOTE — Assessment & Plan Note (Signed)
A1c well above goal.  We discussed at length.  Recommended consideration of tirzepatide.  Unclear if insulin is effective at the given dose at 1 time.  Discussed.  Split into 35 and 35 units.  Follow-up with pharmacy team.  We previously discussed addition of an SGLT2 inhibitor.  The patient has a history of some urinary incontinence is on oxybutynin for this.  Additionally she has a history of Candida infections.  Would be hesitant to start this medication.

## 2021-04-27 NOTE — Telephone Encounter (Signed)
Reviewed, completed, and signed form.  Note routed to RN team inbasket and placed completed form in Clinic RN's office (wall pocket above desk).Please make a copy for chart.   Westley Chandler, MD

## 2021-04-27 NOTE — Assessment & Plan Note (Signed)
At goal continue current therapy. 

## 2021-04-27 NOTE — Assessment & Plan Note (Signed)
Continue semaglutide. Encouraged increased activity--improved with better vision recently

## 2021-04-27 NOTE — Patient Instructions (Addendum)
It was wonderful to see you today.  Please bring ALL of your medications with you to every visit.   Today we talked about:  - Splitting your insulin into 35 units and 35 units  Start tonight with 35 units at night   I will send in your paperwork  Please follow up in March   Thank you  so much for seeing me today      Thank you for choosing [facility].   Please call [phone] with any questions about today's appointment.  Please be sure to schedule follow up at the front  desk before you leave today.   Terisa Starr, MD  Family Medicine

## 2021-04-28 ENCOUNTER — Ambulatory Visit: Payer: BC Managed Care – PPO | Admitting: Physical Medicine & Rehabilitation

## 2021-04-28 ENCOUNTER — Telehealth: Payer: Self-pay

## 2021-04-28 NOTE — Telephone Encounter (Signed)
PA from [facility] calls nurse line to inform PCP the patient decided to hold aspirin prior to surgery today.   PA reports the patient stopped aspirin ~1 week ago. The surgeon felt ok to proceed with procedure.  More of a FYI for PCP.

## 2021-04-30 ENCOUNTER — Ambulatory Visit: Payer: BC Managed Care – PPO

## 2021-04-30 ENCOUNTER — Encounter: Payer: Self-pay | Admitting: Family Medicine

## 2021-04-30 ENCOUNTER — Other Ambulatory Visit: Payer: Self-pay

## 2021-04-30 DIAGNOSIS — R41841 Cognitive communication deficit: Secondary | ICD-10-CM | POA: Diagnosis not present

## 2021-04-30 DIAGNOSIS — R4701 Aphasia: Secondary | ICD-10-CM

## 2021-04-30 NOTE — Therapy (Signed)
[facility] [address] [place], [place], [address] Phone: [phone]   Fax:  [phone]  Speech Language Pathology Treatment  Patient Details  Name: Amber [name] MRN: [id number] Date of Birth: May 11, 1970 Referring Provider (SLP): Westley Chandler, MD   Encounter Date: 04/30/2021   End of Session - 04/30/21 1307     Visit Number 5    Number of Visits 13    Date for SLP Re-Evaluation 06/25/21    Authorization Type BCBS    SLP Start Time 1305    SLP Stop Time  1350    SLP Time Calculation (min) 45 min    Activity Tolerance Patient tolerated treatment well             Past Medical History:  Diagnosis Date   Cerebrovascular disease    Depression    DM2 (diabetes mellitus, type 2) ([facility])    HLD (hyperlipidemia)    HTN (hypertension)     Past Surgical History:  Procedure Laterality Date   right eye surgery Right    had retinal hemorrhage    There were [name] vitals filed for this visit.   Subjective Assessment - 04/30/21 1346     Subjective "I got here on the right day"    Currently in Pain? [name]/denies                   ADULT SLP TREATMENT - 04/30/21 1307       General Information   Behavior/Cognition Alert;Cooperative;Pleasant mood      Treatment Provided   Treatment provided Cognitive-Linquistic      Cognitive-Linquistic Treatment   Treatment focused on Cognition;Aphasia    Skilled Treatment Pt returned with HEP, in which some spelling errors (substitutions & omissions) endorsed as well as difficulty "accessing the word" for more abstract categories. Pt attemped categorizations but encountered reduced mental flexability as pt would become stuck intermittently. SLP provided rare min A to aid word finding.  Pt is reading 2 pages in novel at a time and summarizing with success. Some slower processing reported, in which pt become increasingly aware of need to slow down to aid word finding and  processing. SLP idenitified reduced attention appears to be impacting performance, with focus on attention to detail tasks completed today. Pt aware of deviation in attention x1 in mid task and able to self-correct. Pt completed tasks with 100% accuracy without cues.      Assessment / Recommendations / Plan   Plan Continue with current plan of care      Progression Toward Goals   Progression toward goals Progressing toward goals              SLP Education - 04/30/21 1355     Education Details improving awareness, continue use of strategies    Person(s) Educated Patient    Methods Explanation;Demonstration;Handout    Comprehension Verbalized understanding;Returned demonstration;Need further instruction              SLP Short Term Goals - 04/30/21 1322       SLP SHORT TERM GOAL #1   Title Pt will complete CLQT and PROM in first ST session    Baseline CF: 67    Status Achieved      SLP SHORT TERM GOAL #2   Title Pt will verbalize implementation of 2 memory/attention compensations to aid completion of daily tasks given occasional min A over 2 sessions    Baseline 04-15-21, 04-30-21    Status  Achieved      SLP SHORT TERM GOAL #3   Title Pt will utilize comprehension/processing strategies in 10+ minute conversation to optimize engagement and understanding given occasional min A over 2 sessions    Baseline 04-15-21, 04-30-21    Time 2    Status Achieved      SLP SHORT TERM GOAL #4   Title Pt will complete mod complex naming tasks with 80% accuracy given rare min A over 2 sessions    Baseline 04-15-21, 04-23-21    Status Achieved      SLP SHORT TERM GOAL #5   Title Pt will use word finding compensations in 10+ minute conversation given occasional min A over 2 sessions    Baseline 04-23-21, 2-9-2    Time 2    Status Achieved              SLP Long Term Goals - 04/30/21 1343       SLP LONG TERM GOAL #1   Title Pt will verbalize implementation of 4 memory/attention  compensations to aid completion of daily tasks given rare min A over 2 sessions    Time 5    Period Weeks    Status On-going      SLP LONG TERM GOAL #2   Title Pt will utilize comprehension/processing strategies in 20+ minute conversation to optimize engagement and understanding given rare min A over 2 sessions    Time 9    Period Weeks    Status On-going      SLP LONG TERM GOAL #3   Title Pt will use word finding compensations in 20+ minute conversation given rare min A over 2 sessions    Time 9    Period Weeks    Status On-going              Plan - 04/30/21 1308     Clinical Impression Statement "Amber [name]" was referred by OPOT as pt was experiencing high level cognitive deficits s/p CVA in July 2022. Pt exhibits improvements in verbal expression, recall, and attention with rare to occasional cues required on mod complex structured tasks. Pt is implementing use of compensations to aid accurate completion of tasks given increased awareness of how to modify tasks for success. I recommend skilled ST intervention to address cogntive communication and aphasia to optimize return to baseline and improve QOL.    Speech Therapy Frequency 1x /week   Pt request   Duration 12 weeks    Treatment/Interventions Multimodal communcation approach;Cognitive reorganization;SLP instruction and feedback;Internal/external aids;Compensatory techniques;Language facilitation;Functional tasks;Patient/family education;Compensatory strategies    Potential to Achieve Goals Good    Consulted and Agree with Plan of Care Family member/caregiver;Patient             Patient will benefit from skilled therapeutic intervention in order to improve the following deficits and impairments:   Cognitive communication deficit  Aphasia    Problem List Patient Active Problem List   Diagnosis Date Noted   OSA (obstructive sleep apnea) 03/30/2021   Morbid obesity ([facility]) 12/29/2020   Left pontine cerebrovascular  accident ([facility]) 10/01/2020   HTN (hypertension)    DM2 (diabetes mellitus, type 2) ([facility])    HLD (hyperlipidemia)    Depression     Janann Colonel, CCC-SLP 04/30/2021, 1:56 PM  [facility] [address] [place], [place], [address] Phone: [phone]   Fax:  [phone]   Name: Amber [name] MRN: [id number] Date of Birth: 1970/09/29

## 2021-05-04 ENCOUNTER — Other Ambulatory Visit: Payer: Self-pay

## 2021-05-04 ENCOUNTER — Encounter: Payer: Self-pay | Admitting: Pharmacist

## 2021-05-04 ENCOUNTER — Ambulatory Visit (INDEPENDENT_AMBULATORY_CARE_PROVIDER_SITE_OTHER): Payer: BC Managed Care – PPO | Admitting: Pharmacist

## 2021-05-04 DIAGNOSIS — E1159 Type 2 diabetes mellitus with other circulatory complications: Secondary | ICD-10-CM

## 2021-05-04 MED ORDER — MOUNJARO 5 MG/0.5ML ~~LOC~~ SOAJ: 5.0000 mg | SUBCUTANEOUS | 0 refills | Status: DC

## 2021-05-04 MED ORDER — BASAGLAR KWIKPEN 100 UNIT/ML ~~LOC~~ SOPN: 40.0000 [IU] | PEN_INJECTOR | Freq: Two times a day (BID) | SUBCUTANEOUS | 3 refills | Status: DC

## 2021-05-04 MED ORDER — NOVOLOG FLEXPEN 100 UNIT/ML ~~LOC~~ SOPN: 20.0000 [IU] | PEN_INJECTOR | Freq: Two times a day (BID) | SUBCUTANEOUS | 11 refills | Status: DC

## 2021-05-04 NOTE — Progress Notes (Signed)
S:    Chief Complaint  Patient presents with   Medication Management    Diabetes  Amber [name] is a 51 y.o. female who presents for diabetes evaluation, education, and management. PMH is significant for stroke, HTN, HLD. Patient was referred and last seen by Primary Care Provider, Dr. Manson Passey, on 04/27/2021. At last visit, discussed possibility of starting tirzepatide [name])   Today, She arrives in good spirits and presents with assistance of a cane.   Current diabetes medications include: insulin lispro (Novolog 20 units; If >150, add 11 units. If >200, add another 10 units. Basaglar (insulin glargine) 35 units twice a day. Ozempic (semaglutide) 2mg  once a week. Current hypertension medications include: lisinopril/HCTZ 20/12.5mg  Current hyperlipidemia medications include: atorvastatin 80mg    Patient states that She is taking her medications as prescribed. Patient reports adherence with medications. Patient states that she does not miss doses of her medications often.   Do you feel that your medications are working for you? No; she feels her insulin glargine does not work as well when spilt into two doses. Have you been experiencing any side effects to the medications prescribed? no   Insurance coverage: BCBS of [place]   Patient denies hypoglycemic events.   Reported home fasting blood sugars: average 200    Patient reports nocturia (nighttime urination); at least twice a night. Patient denies visual changes; patient is set to have cataract surgery   Patient reported dietary habits: Eats 2 meals/day   Patient-reported exercise habits: limited in daily activity since stroke. Has slowly increased in activity and exercise.  O:  Physical Exam Constitutional:      Appearance: Normal appearance.  Neurological:     Mental Status: She is alert.  Psychiatric:        Mood and Affect: Mood normal.        Behavior: Behavior normal.    Review of Systems  Genitourinary:  Positive for  frequency.  All other systems reviewed and are negative.  Lab Results  Component Value Date   HGBA1C 9.4 (A) 04/27/2021   Vitals:   05/04/21 1002  BP: (!) 146/82  Pulse: 84  SpO2: 98%    Lipid Panel     Component Value Date/Time   CHOL 129 03/30/2021 1152   TRIG 101 03/30/2021 1152   HDL 42 03/30/2021 1152   CHOLHDL 3.1 03/30/2021 1152   CHOLHDL 7.1 09/25/2020 0348   VLDL 75 (H) 09/25/2020 0348   LDLCALC 68 03/30/2021 1152   LDLDIRECT 78 01/26/2021 1122   A/P: Diabetes longstanding currently uncontrolled, multifactorial including post-stroke stress, sedentary life-style post stroke. Patient is able to verbalize appropriate hypoglycemia management plan. Medication adherence reported as optimal. Control is suboptimal due to lack of medication optimization due to multiple factors.  -Increased dose of basal insulin Basaglar (insulin glargine) 40 units BID. -Adjusted dose of rapid insulin Novolog (insulin aspart) to following sliding scale.   <150 = 20 units  - 150 - 200 = 30 units  - 201-250 = 40 units  - 251 - 300 = 50 units  - >300 = 60 units -Discontinued Ozempic (semaglutide) 2mg . Patient will take last does this week. -Initiated Monjaro (tirzepatide) 5mg  weekly. -Patient educated on purpose, proper use, and potential adverse effects.  -Extensively discussed pathophysiology of diabetes, recommended lifestyle interventions, dietary effects on blood sugar control.  -Counseled on s/sx of and management of hypoglycemia.  -Next A1c anticipated May 2023.   Last LDL is at goal of <70 mg/dL. ASCVD risk  factors include DM. high intensity statin indicated.  -Continued atorvastatin 80 mg.   Hypertension longstanding currently uncontrolled. Blood pressure goal of <130/80 mmHg. Medication adherence appears optimal. Blood pressure control is suboptimal due to other commodities. -Continued lisinopril/HCTZ 20/12.5mg   Written patient instructions provided. Patient verbalized  understanding of treatment plan. Total time in face to face counseling 25 minutes.    Follow up pharmacist in 3 weeks on March 6. Patient seen with Penny Pia, PharmD Candidate.

## 2021-05-04 NOTE — Progress Notes (Signed)
Reviewed: I agree with Dr. Koval's documentation and management. 

## 2021-05-04 NOTE — Patient Instructions (Addendum)
It was nice to see you today.  - Take your last dose of Ozempic this week. -Start Monjaro 5mg  next week. -Increase Basaglar insulin to 40 units twice a day. -New sliding scale for Novolog is as follows:  - <150 = 20 units  - 150 - 200 = 30 units  - 201-250 = 40 units  - 251 - 300 = 50 units  - >300 = 60 units  - If you see multiple readings <120, please call. - We will see you in 3 weeks for follow up

## 2021-05-04 NOTE — Assessment & Plan Note (Signed)
Diabetes longstanding currently uncontrolled, multifactorial including post-stroke stress, sedentary life-style post stroke. Patient is able to verbalize appropriate hypoglycemia management plan. Medication adherence reported as optimal. Control is suboptimal due to lack of medication optimization due to multiple factors.  -Increased dose of basal insulin Basaglar (insulin glargine) 40 units BID. -Adjusted dose of rapid insulin Novolog (insulin aspart) to following sliding scale.   <150 = 20 units  - 150 - 200 = 30 units  - 201-250 = 40 units  - 251 - 300 = 50 units  - >300 = 60 units -Discontinued Ozempic (semaglutide) 2mg . Patient will take last does this week. -Initiated Monjaro (tirzepatide) 5mg  weekly. -Patient educated on purpose, proper use, and potential adverse effects.  -Extensively discussed pathophysiology of diabetes, recommended lifestyle interventions, dietary effects on blood sugar control.  -Counseled on s/sx of and management of hypoglycemia.  -Next A1c anticipated May 2023.

## 2021-05-07 ENCOUNTER — Ambulatory Visit: Payer: BC Managed Care – PPO

## 2021-05-07 ENCOUNTER — Other Ambulatory Visit: Payer: Self-pay

## 2021-05-07 DIAGNOSIS — R41841 Cognitive communication deficit: Secondary | ICD-10-CM

## 2021-05-07 NOTE — Therapy (Signed)
[facility] [address] [place], [place], [address] Phone: [phone]   Fax:  [phone]  Speech Language Pathology Treatment  Patient Details  Name: Amber [name] MRN: [id number] Date of Birth: 08-02-1970 Referring Provider (SLP): Martyn Malay, MD   Encounter Date: 05/07/2021   End of Session - 05/07/21 1251     Visit Number 6    Number of Visits 13    Date for SLP Re-Evaluation 06/25/21    Authorization Type BCBS    SLP Start Time 1016    SLP Stop Time  1100    SLP Time Calculation (min) 44 min    Activity Tolerance Patient tolerated treatment well             Past Medical History:  Diagnosis Date   Cerebrovascular disease    Depression    DM2 (diabetes mellitus, type 2) ([place])    HLD (hyperlipidemia)    HTN (hypertension)     Past Surgical History:  Procedure Laterality Date   right eye surgery Right    had retinal hemorrhage    There were no vitals filed for this visit.   Subjective Assessment - 05/07/21 1018     Subjective "I was busy from 10 to 6 yesterday"    Patient is accompained by: Family member    Currently in Pain? No/denies                   ADULT SLP TREATMENT - 05/07/21 1019       General Information   Behavior/Cognition Alert;Cooperative;Pleasant mood      Treatment Provided   Treatment provided Cognitive-Linquistic      Cognitive-Linquistic Treatment   Treatment focused on Cognition;Aphasia    Skilled Treatment Pt reported cognitive/phyiscal fatigue from yesterday's outing. Pt completed HEP this morning within hour with intermittent errors exhibited. In review of HEP, pt able to idenitfy and correct errors without SLP A demonstrating increased attention, error awareness, and improved processing. Pt demonstrated good alternating attention on sequencing task. With SLP prompting, pt generated appropriate strategies to address current concerns, including  procrastinating. Heightened awareness and problem solving exhibited since intiaiton of ST services. No overt word finding exhibited in discussion today.      Assessment / Recommendations / Plan   Plan Continue with current plan of care      Progression Toward Goals   Progression toward goals Progressing toward goals              SLP Education - 05/07/21 1251     Education Details good progress thus far, HEP    Person(s) Educated Patient;Spouse    Methods Explanation;Demonstration;Handout    Comprehension Verbalized understanding;Returned demonstration;Need further instruction              SLP Short Term Goals - 04/30/21 1322       SLP SHORT TERM GOAL #1   Title Pt will complete CLQT and PROM in first ST session    Baseline CF: 67    Status Achieved      SLP SHORT TERM GOAL #2   Title Pt will verbalize implementation of 2 memory/attention compensations to aid completion of daily tasks given occasional min A over 2 sessions    Baseline 04-15-21, 04-30-21    Status Achieved      SLP SHORT TERM GOAL #3   Title Pt will utilize comprehension/processing strategies in 10+ minute conversation to optimize engagement and understanding given occasional min A over 2  sessions    Baseline 04-15-21, 04-30-21    Time 2    Status Achieved      SLP SHORT TERM GOAL #4   Title Pt will complete mod complex naming tasks with 80% accuracy given rare min A over 2 sessions    Baseline 04-15-21, 04-23-21    Status Achieved      SLP SHORT TERM GOAL #5   Title Pt will use word finding compensations in 10+ minute conversation given occasional min A over 2 sessions    Baseline 04-23-21, 2-9-2    Time 2    Status Achieved              SLP Long Term Goals - 05/07/21 1031       SLP LONG TERM GOAL #1   Title Pt will verbalize implementation of 4 memory/attention compensations to aid completion of daily tasks given rare min A over 2 sessions    Baseline 05-07-21    Time 4    Period Weeks     Status On-going      SLP LONG TERM GOAL #2   Title Pt will utilize comprehension/processing strategies in 20+ minute conversation to optimize engagement and understanding given rare min A over 2 sessions    Time 8    Period Weeks    Status On-going      SLP LONG TERM GOAL #3   Title Pt will use word finding compensations in 20+ minute conversation given rare min A over 2 sessions    Time 8    Period Weeks    Status On-going              Plan - 05/07/21 1252     Clinical Impression Statement "Amber [name]" was referred by [facility] as pt was experiencing high level cognitive deficits s/p CVA in July 2022. Pt exhibits improvements in verbal expression, awareness, recall, and attention with rare cues required on mod complex structured tasks. Pt is implementing use of compensations to aid accurate completion of tasks given increased awareness of how to modify tasks for success. I recommend skilled ST intervention to address cogntive communication and aphasia to optimize return to baseline and improve QOL.    Speech Therapy Frequency 1x /week   Pt request   Duration 12 weeks    Treatment/Interventions Multimodal communcation approach;Cognitive reorganization;SLP instruction and feedback;Internal/external aids;Compensatory techniques;Language facilitation;Functional tasks;Patient/family education;Compensatory strategies    Potential to Achieve Goals Good    Consulted and Agree with Plan of Care Family member/caregiver;Patient             Patient will benefit from skilled therapeutic intervention in order to improve the following deficits and impairments:   Cognitive communication deficit    Problem List Patient Active Problem List   Diagnosis Date Noted   OSA (obstructive sleep apnea) 03/30/2021   Morbid obesity ([place]) 12/29/2020   Left pontine cerebrovascular accident ([place]) 10/01/2020   HTN (hypertension)    DM2 (diabetes mellitus, type 2) ([place])    HLD (hyperlipidemia)    Depression      Alinda Deem, [place] 05/07/2021, 12:53 PM  [facility] [address] [place], [place], [address] Phone: [phone]   Fax:  [phone]   Name: Amber [name] MRN: [id number] Date of Birth: May 14, 1970

## 2021-05-11 ENCOUNTER — Encounter: Payer: Self-pay | Admitting: Family Medicine

## 2021-05-14 ENCOUNTER — Ambulatory Visit: Payer: BC Managed Care – PPO

## 2021-05-14 ENCOUNTER — Other Ambulatory Visit: Payer: Self-pay

## 2021-05-14 ENCOUNTER — Encounter: Payer: Self-pay | Admitting: Family Medicine

## 2021-05-14 DIAGNOSIS — E1159 Type 2 diabetes mellitus with other circulatory complications: Secondary | ICD-10-CM

## 2021-05-14 DIAGNOSIS — R4701 Aphasia: Secondary | ICD-10-CM

## 2021-05-14 DIAGNOSIS — R41841 Cognitive communication deficit: Secondary | ICD-10-CM

## 2021-05-14 NOTE — Therapy (Signed)
[facility] [address] [place], [place], [address] Phone: [phone]   Fax:  [phone]  Speech Language Pathology Treatment  Patient Details  Name: Amber [name] MRN: [id number] Date of Birth: 06-04-1970 Referring Provider (SLP): Westley Chandler, MD   Encounter Date: 05/14/2021   End of Session - 05/14/21 1103     Visit Number 7    Number of Visits 13    Date for SLP Re-Evaluation 06/25/21    Authorization Type BCBS    SLP Start Time 1017    SLP Stop Time  1057    SLP Time Calculation (min) 40 min    Activity Tolerance Patient tolerated treatment well             Past Medical History:  Diagnosis Date   Cerebrovascular disease    Depression    DM2 (diabetes mellitus, type 2) ([facility])    HLD (hyperlipidemia)    HTN (hypertension)     Past Surgical History:  Procedure Laterality Date   right eye surgery Right    had retinal hemorrhage    There were no vitals filed for this visit.   Subjective Assessment - 05/14/21 1018     Subjective "excellent"    Patient is accompained by: Family member    Currently in Pain? No/denies                   ADULT SLP TREATMENT - 05/14/21 1019       General Information   Behavior/Cognition Alert;Cooperative;Pleasant mood      Treatment Provided   Treatment provided Cognitive-Linquistic      Cognitive-Linquistic Treatment   Treatment focused on Cognition;Aphasia    Skilled Treatment Pt continues with successful implementation of SLP recommendations and strategies to optimize cognitive linguistic functioning. Pt demonstrates increased metacognition and self-awareness. Pt has completed multiple applications and taxes with reported success. In extended mod complex conversation today, no overt word finding exhibited. With further analysis, it appears word finding occurs most frequently when cognitively/physically fatigued. SLP provided instructions to  monitor and implement breaks as well as structuring day to focus important conversations during most awake/alert times of day. Pt verbalized understanding and agreement.      Assessment / Recommendations / Plan   Plan Continue with current plan of care      Progression Toward Goals   Progression toward goals Progressing toward goals              SLP Education - 05/14/21 1102     Education Details energy conversation in conversation, metacognition    Person(s) Educated Patient;Spouse    Methods Explanation;Demonstration;Verbal cues    Comprehension Verbalized understanding;Returned demonstration;Need further instruction              SLP Short Term Goals - 04/30/21 1322       SLP SHORT TERM GOAL #1   Title Pt will complete CLQT and PROM in first ST session    Baseline CF: 67    Status Achieved      SLP SHORT TERM GOAL #2   Title Pt will verbalize implementation of 2 memory/attention compensations to aid completion of daily tasks given occasional min A over 2 sessions    Baseline 04-15-21, 04-30-21    Status Achieved      SLP SHORT TERM GOAL #3   Title Pt will utilize comprehension/processing strategies in 10+ minute conversation to optimize engagement and understanding given occasional min A over 2 sessions  Baseline 04-15-21, 04-30-21    Time 2    Status Achieved      SLP SHORT TERM GOAL #4   Title Pt will complete mod complex naming tasks with 80% accuracy given rare min A over 2 sessions    Baseline 04-15-21, 04-23-21    Status Achieved      SLP SHORT TERM GOAL #5   Title Pt will use word finding compensations in 10+ minute conversation given occasional min A over 2 sessions    Baseline 04-23-21, 2-9-2    Time 2    Status Achieved              SLP Long Term Goals - 05/14/21 1031       SLP LONG TERM GOAL #1   Title Pt will verbalize implementation of 4 memory/attention compensations to aid completion of daily tasks given rare min A over 2 sessions     Baseline 05-07-21    Time 3    Period Weeks    Status On-going      SLP LONG TERM GOAL #2   Title Pt will utilize comprehension/processing strategies in 20+ minute conversation to optimize engagement and understanding given rare min A over 2 sessions    Baseline 05-14-21    Time 7    Period Weeks    Status On-going      SLP LONG TERM GOAL #3   Title Pt will use word finding compensations in 20+ minute conversation given rare min A over 2 sessions    Baseline 05-14-21    Time 7    Period Weeks    Status On-going              Plan - 05/14/21 1103     Clinical Impression Statement "Amber [name]" was referred by [facility] as pt was experiencing high level cognitive deficits s/p CVA in July 2022. Pt continues to exhibit improvements in verbal expression, awareness, recall, and attention with rare cues required on mod complex to complex structured tasks. Pt is implementing use of compensations to aid accurate completion of tasks given increased awareness of how to modify tasks for success. I recommend skilled ST intervention to address cogntive communication and aphasia to optimize return to baseline and improve QOL.    Speech Therapy Frequency 1x /week   Pt request   Duration 12 weeks    Treatment/Interventions Multimodal communcation approach;Cognitive reorganization;SLP instruction and feedback;Internal/external aids;Compensatory techniques;Language facilitation;Functional tasks;Patient/family education;Compensatory strategies    Potential to Achieve Goals Good    Consulted and Agree with Plan of Care Family member/caregiver;Patient             Patient will benefit from skilled therapeutic intervention in order to improve the following deficits and impairments:   Cognitive communication deficit  Aphasia    Problem List Patient Active Problem List   Diagnosis Date Noted   OSA (obstructive sleep apnea) 03/30/2021   Morbid obesity ([facility]) 12/29/2020   Left pontine cerebrovascular  accident ([facility]) 10/01/2020   HTN (hypertension)    DM2 (diabetes mellitus, type 2) ([facility])    HLD (hyperlipidemia)    Depression     Gracy Racer, CCC-SLP 05/14/2021, 11:05 AM  [facility] [address] [place], [place], [address] Phone: [phone]   Fax:  [phone]   Name: Amber [name] MRN: [id number] Date of Birth: [birth year]

## 2021-05-18 MED ORDER — OZEMPIC (1 MG/DOSE) 4 MG/3ML ~~LOC~~ SOPN: 1.0000 mg | PEN_INJECTOR | SUBCUTANEOUS | 3 refills | Status: DC

## 2021-05-21 ENCOUNTER — Other Ambulatory Visit: Payer: Self-pay

## 2021-05-21 ENCOUNTER — Encounter
Payer: BC Managed Care – PPO | Attending: Physical Medicine & Rehabilitation | Admitting: Physical Medicine & Rehabilitation

## 2021-05-21 ENCOUNTER — Encounter: Payer: Self-pay | Admitting: Physical Medicine & Rehabilitation

## 2021-05-21 ENCOUNTER — Ambulatory Visit: Payer: BC Managed Care – PPO | Attending: Family Medicine

## 2021-05-21 VITALS — BP 118/77 | HR 87 | Ht 67.0 in | Wt 282.4 lb

## 2021-05-21 DIAGNOSIS — I639 Cerebral infarction, unspecified: Secondary | ICD-10-CM | POA: Diagnosis not present

## 2021-05-21 DIAGNOSIS — R4701 Aphasia: Secondary | ICD-10-CM | POA: Diagnosis not present

## 2021-05-21 DIAGNOSIS — R41841 Cognitive communication deficit: Secondary | ICD-10-CM | POA: Insufficient documentation

## 2021-05-21 NOTE — Patient Instructions (Signed)
Attention appears to be playing a role in your word finding. Focus on the topic/idea to aid your attention.  ? ?When you have trouble saying the word you want to say: ? ?1)  Describe it! Describe the size, color, shape, function, composition (what it's made of), and/or location to be able to have the word come sooner, or to have your listener help you out ? ?2) "talk around the word" (say it a totally different way) -get your point out using different words than the one/ones you can't think of ? ?3) Use a synonym - think of another word that means the exact same thing ? ?4) DRAW! You can draw some things you want to say in order to give your listener a hint about what you're talking about ? ?5)  Gesture- make motions to help your listener understand what you are trying to communicate ? ?6) Write down the word, if you can - or the first letter or letters, to help you say the word or to give your listener a hint about what you're trying to say  ? ?

## 2021-05-21 NOTE — Progress Notes (Signed)
? ?Subjective:  ? ? Patient ID: Amber [name], female    DOB: December 12, 1970, 51 y.o.   MRN: [id number] ?51 y.o. right-handed female with history of hypertension hyperlipidemia type 2 diabetes mellitus.  Per chart review she lives in an apartment in her mother's basement.  Independent prior to admission.  She has 14 steps down to the living room.  She works full-time as a Engineer, site.  Presented 09/24/2020 after being found down for an extended amount of time with right side weakness as well as facial droop.  Cranial CT scan showed abnormal hypoattenuation within the left pons and left thalamus concerning for age-indeterminate infarction.  CT angiogram head and neck no emergent large vessel occlusion.  Patient did not receive tPA.  MRI showed a 10 mm acute infarction in the left pons.  No hemorrhage or mass-effect.  Echocardiogram with ejection fraction of 60 to 65% grade 2 diastolic dysfunction.  No regional wall motion abnormalities.  Admission chemistries unremarkable except sodium 133 glucose 310 CK 1112 urinalysis positive nitrite.  Maintained on aspirin as well as Plavix for CVA prophylaxis x3 months then aspirin alone  ?Admit date: 10/01/2020 ?Discharge date: 10/15/2020 ?HPI ?51 year old female with history of left pontine infarct who follows up today with complaints of fatigue.  Her fatigue is improving and time.  She feels that when she was working out on recumbent bike her endurance was better.  She has had to take a break from this because she has undergone cataract surgery on both eyes.Waiting from optho clearance post cataract removal for return to gym and work on endurance  ?Bladder controlled with Oxybutnin, we discussed the possibility of weaning this but she does not think she is ready to do so yet. ? ? ?No falls since November  ? ?Driving but limits distance due to fatigue ? ? ? ?Pain Inventory ?Average Pain 0 ?Pain Right Now 0 ?My pain is  no pain ? ?In the last 24 hours, has pain interfered with the  following? ?General activity 0 ?Relation with others 0 ?Enjoyment of life 0 ? ?Sleep (in general) Fair ? ?Pain is worse with:  no pain ?Pain improves with:  no pain ?Relief from Meds:  no pain ? ?Family History  ?Problem Relation Age of Onset  ? Diabetes Mother   ? Stroke Mother   ? Hypertension Mother   ? Diabetes Father   ? Alcohol abuse Father   ? Heart attack Father   ? Hypertension Father   ? Diabetes Sister   ? Bipolar disorder Maternal Uncle   ? ?Social History  ? ?Socioeconomic History  ? Marital status: Divorced  ?  Spouse name: Not on file  ? Number of children: Not on file  ? Years of education: Not on file  ? Highest education level: Not on file  ?Occupational History  ? Not on file  ?Tobacco Use  ? Smoking status: Never  ? Smokeless tobacco: Never  ?Vaping Use  ? Vaping Use: Former  ?Substance and Sexual Activity  ? Alcohol use: Never  ? Drug use: Never  ? Sexual activity: Yes  ?  Partners: Male  ?Other Topics Concern  ? Not on file  ?Social History Narrative  ? Works as Runner, broadcasting/film/video  ? Mother was deputy sheriff  ? In committed relationship   ?   ? 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   ? [employer]   ? Masters at [employer]   ? ?Social Determinants of Health  ? ?Financial  Resource Strain: Not on file  ?Food Insecurity: Not on file  ?Transportation Needs: Not on file  ?Physical Activity: Not on file  ?Stress: Not on file  ?Social Connections: Not on file  ? ?Past Surgical History:  ?Procedure Laterality Date  ? right eye surgery Right   ? had retinal hemorrhage  ? ?Past Surgical History:  ?Procedure Laterality Date  ? right eye surgery Right   ? had retinal hemorrhage  ? ?Past Medical History:  ?Diagnosis Date  ? Cerebrovascular disease   ? Depression   ? DM2 (diabetes mellitus, type 2) (HCC)   ? HLD (hyperlipidemia)   ? HTN (hypertension)   ? ?BP 118/77   Pulse 87   Ht 5\' 7"  (1.702 m)   Wt 282 lb 6.4 oz (128.1 kg)   SpO2 96%   BMI 44.23 kg/m?  ? ?Opioid Risk Score:   ?Fall Risk Score:  1 ? ?Depression screen PHQ  2/9 ? ?Depression screen [facility] 2/9 04/27/2021 03/30/2021 02/26/2021 01/26/2021 12/29/2020 11/28/2020 10/27/2020  ?Decreased Interest 0 0 0 0 1 0 0  ?Down, Depressed, Hopeless 0 0 0 0 1 0 -  ?PHQ - 2 Score 0 0 0 0 2 0 0  ?Altered sleeping 1 1 - 0 2 2 0  ?Tired, decreased energy 1 2 - 3 3 3 1   ?Change in appetite 0 0 - 0 3 0 1  ?Feeling bad or failure about yourself  0 0 - 0 3 1 0  ?Trouble concentrating 1 2 - 0 1 1 0  ?Moving slowly or fidgety/restless 1 2 - 0 0 1 0  ?Suicidal thoughts 0 0 - 0 0 0 0  ?PHQ-9 Score 4 7 - 3 14 8 2   ?Difficult doing work/chores - - - - Very difficult Extremely dIfficult -  ? ? ?Review of Systems  ?Constitutional: Negative.   ?HENT: Negative.    ?Eyes: Negative.   ?Respiratory: Negative.    ?Cardiovascular: Negative.   ?Gastrointestinal: Negative.   ?Endocrine: Negative.   ?Genitourinary: Negative.   ?Musculoskeletal:  Positive for gait problem.  ?Skin: Negative.   ?Allergic/Immunologic: Negative.   ?Hematological: Negative.   ?Psychiatric/Behavioral: Negative.    ?All other systems reviewed and are negative. ? ?   ?Objective:  ? Physical Exam ?Vitals and nursing note reviewed.  ?Constitutional:   ?   Appearance: She is obese.  ?HENT:  ?   Head: Normocephalic and atraumatic.  ?Eyes:  ?   Extraocular Movements: Extraocular movements intact.  ?   Conjunctiva/sclera: Conjunctivae normal.  ?   Pupils: Pupils are equal, round, and reactive to light.  ?Neurological:  ?   Mental Status: She is alert and oriented to person, place, and time.  ?   Cranial Nerves: No dysarthria.  ?   Sensory: Sensation is intact.  ?   Motor: Weakness present.  ?   Coordination: Coordination abnormal. Finger-Nose-Finger Test abnormal. Impaired rapid alternating movements.  ?   Gait: Gait abnormal.  ?   Comments: Motor strength is 4/5 in the right deltoid, bicep, tricep, grip ?5/5 in the left deltoid, bicep, tricep, grip ?5/5 in bilateral hip flexors knee extensors ankle dorsiflexors ?Negative straight leg raising  bilaterally ?Ambulates with a cane widened base of support no evidence of toe drag or knee instability ?Speech without dysarthria ?Mild anomia  ? ? ? ? ? ?   ?Assessment & Plan:  ?1.  Left pontine infarct with right hemiparesis.  Also with neurasthenia.  Overall she is at  a modified independent level using a straight cane. ?She is back to driving. ?I do think her fatigue should improve with time as well as continued cardiorespiratory fitness training.  Weight loss should be helpful in this regard as well. ?I will see the patient back in approximately 4 months. ? ?

## 2021-05-21 NOTE — Therapy (Signed)
[facility] ?[place], [place], [address] ?Phone: [phone]   Fax:  [phone] ? ?Speech Language Pathology Treatment ? ?Patient Details  ?Name: Amber [name] ?MRN: [id number] ?Date of Birth: 02/24/1971 ?Referring Provider (SLP): Martyn Malay, MD ? ? ?Encounter Date: 05/21/2021 ? ? End of Session - 05/21/21 1043   ? ? Visit Number 8   ? Number of Visits 13   ? Date for SLP Re-Evaluation 06/25/21   ? Authorization Type BCBS   ? SLP Start Time 1020   pt arrived late  ? SLP Stop Time  1100   ? SLP Time Calculation (min) 40 min   ? Activity Tolerance Patient tolerated treatment well   ? ?  ?  ? ?  ? ? ?Past Medical History:  ?Diagnosis Date  ? Cerebrovascular disease   ? Depression   ? DM2 (diabetes mellitus, type 2) ([place])   ? HLD (hyperlipidemia)   ? HTN (hypertension)   ? ? ?Past Surgical History:  ?Procedure Laterality Date  ? right eye surgery Right   ? had retinal hemorrhage  ? ? ?There were no vitals filed for this visit. ? ? Subjective Assessment - 05/21/21 1023   ? ? Subjective "I don't like Google maps"   ? Patient is accompained by: Family member   mother  ? Currently in Pain? No/denies   ? ?  ?  ? ?  ? ? ? ? ? ? ? ? ADULT SLP TREATMENT - 05/21/21 1026   ? ?  ? General Information  ? Behavior/Cognition Alert;Cooperative;Pleasant mood   ?  ? Treatment Provided  ? Treatment provided Cognitive-Linquistic   ?  ? Cognitive-Linquistic Treatment  ? Treatment focused on Cognition;Aphasia   ? Skilled Treatment Pt reported frustration related to GPS system for late arrival. SLP prompted pt to identify solutions, in which pt able to generate solutions x2 with mild prompting. Complaints of increased word finding reported, in which SLP educated and instructed anomia compensations and strategies to aid attention during conversation. Pt completed synonym task with occasional extra processing time and rare min A to identify mod complex synonyms. Pt  able to identify and self-correct deviations in attention during task.   ?  ? Assessment / Recommendations / Plan  ? Plan Continue with current plan of care   ?  ? Progression Toward Goals  ? Progression toward goals Progressing toward goals   ? ?  ?  ? ?  ? ? ? SLP Education - 05/21/21 1042   ? ? Education Details anomia compensations, naming tasks   ? Person(s) Educated Patient;Parent(s)   ? Methods Explanation;Demonstration;Handout   ? Comprehension Verbalized understanding;Returned demonstration;Need further instruction   ? ?  ?  ? ?  ? ? ? SLP Short Term Goals - 04/30/21 1322   ? ?  ? SLP SHORT TERM GOAL #1  ? Title Pt will complete CLQT and PROM in first ST session   ? Baseline CF: 67   ? Status Achieved   ?  ? SLP SHORT TERM GOAL #2  ? Title Pt will verbalize implementation of 2 memory/attention compensations to aid completion of daily tasks given occasional min A over 2 sessions   ? Baseline 04-15-21, 04-30-21   ? Status Achieved   ?  ? SLP SHORT TERM GOAL #3  ? Title Pt will utilize comprehension/processing strategies in 10+ minute conversation to optimize engagement and understanding given occasional min A over 2 sessions   ?  Baseline 04-15-21, 04-30-21   ? Time 2   ? Status Achieved   ?  ? SLP SHORT TERM GOAL #4  ? Title Pt will complete mod complex naming tasks with 80% accuracy given rare min A over 2 sessions   ? Baseline 04-15-21, 04-23-21   ? Status Achieved   ?  ? SLP SHORT TERM GOAL #5  ? Title Pt will use word finding compensations in 10+ minute conversation given occasional min A over 2 sessions   ? Baseline 04-23-21, 2-9-2   ? Time 2   ? Status Achieved   ? ?  ?  ? ?  ? ? ? SLP Long Term Goals - 05/21/21 1044   ? ?  ? SLP LONG TERM GOAL #1  ? Title Pt will verbalize implementation of 4 memory/attention compensations to aid completion of daily tasks given rare min A over 2 sessions   ? Baseline 05-07-21   ? Time 2   ? Period Weeks   ? Status On-going   ?  ? SLP LONG TERM GOAL #2  ? Title Pt will utilize  comprehension/processing strategies in 20+ minute conversation to optimize engagement and understanding given rare min A over 2 sessions   ? Baseline 05-14-21   ? Time 6   ? Period Weeks   ? Status On-going   ?  ? SLP LONG TERM GOAL #3  ? Title Pt will use word finding compensations in 20+ minute conversation given rare min A over 2 sessions   ? Baseline 05-14-21   ? Time 6   ? Period Weeks   ? Status On-going   ? ?  ?  ? ?  ? ? ? Plan - 05/21/21 1044   ? ? Clinical Impression Statement "Amber [name]" was referred by OPOT as pt was experiencing high level cognitive deficits s/p CVA in July 2022. Pt continues to exhibit improvements in verbal expression, awareness, recall, and attention with rare cues required on mod complex to complex structured tasks. Pt is implementing use of compensations to aid accurate completion of tasks given increased awareness of how to modify tasks for success. Occasional word finding still reported, with education and training completed today for anomia compensations. I recommend skilled ST intervention to address cogntive communication and aphasia to optimize return to baseline and improve QOL.   ? Speech Therapy Frequency 1x /week   Pt request  ? Duration 12 weeks   ? Treatment/Interventions Multimodal communcation approach;Cognitive reorganization;SLP instruction and feedback;Internal/external aids;Compensatory techniques;Language facilitation;Functional tasks;Patient/family education;Compensatory strategies   ? Potential to Achieve Goals Good   ? Consulted and Agree with Plan of Care Family member/caregiver;Patient   ? ?  ?  ? ?  ? ? ?Patient will benefit from skilled therapeutic intervention in order to improve the following deficits and impairments:   ?Aphasia ? ?Cognitive communication deficit ? ? ? ?Problem List ?Patient Active Problem List  ? Diagnosis Date Noted  ? OSA (obstructive sleep apnea) 03/30/2021  ? Morbid obesity ([place]) 12/29/2020  ? Left pontine cerebrovascular accident ([place])  10/01/2020  ? HTN (hypertension)   ? DM2 (diabetes mellitus, type 2) ([place])   ? HLD (hyperlipidemia)   ? Depression   ? ? ?Marzetta Board, CCC-SLP ?05/21/2021, 11:03 AM ? ? ?[facility] ?[place], [place], [address] ?Phone: [phone]   Fax:  [phone] ? ? ?Name: Amber [name] ?MRN: [id number] ?Date of Birth: [birth year] ? ?

## 2021-05-22 ENCOUNTER — Telehealth: Payer: Self-pay | Admitting: Family Medicine

## 2021-05-22 ENCOUNTER — Encounter: Payer: Self-pay | Admitting: Family Medicine

## 2021-05-22 NOTE — Telephone Encounter (Signed)
Reviewed, completed, and signed form.  Note routed to RN team inbasket and placed completed form in Clinic RN's office (wall pocket above desk).  Chastelyn Athens M Simone Tuckey, MD   

## 2021-05-25 ENCOUNTER — Encounter: Payer: Self-pay | Admitting: Pharmacist

## 2021-05-25 ENCOUNTER — Other Ambulatory Visit: Payer: Self-pay

## 2021-05-25 ENCOUNTER — Encounter: Payer: Self-pay | Admitting: Family Medicine

## 2021-05-25 ENCOUNTER — Ambulatory Visit: Payer: BC Managed Care – PPO | Admitting: Family Medicine

## 2021-05-25 ENCOUNTER — Ambulatory Visit: Payer: BC Managed Care – PPO | Admitting: Pharmacist

## 2021-05-25 VITALS — BP 118/58 | Ht 67.0 in | Wt 284.0 lb

## 2021-05-25 DIAGNOSIS — E1159 Type 2 diabetes mellitus with other circulatory complications: Secondary | ICD-10-CM

## 2021-05-25 DIAGNOSIS — G4733 Obstructive sleep apnea (adult) (pediatric): Secondary | ICD-10-CM

## 2021-05-25 DIAGNOSIS — I10 Essential (primary) hypertension: Secondary | ICD-10-CM | POA: Diagnosis not present

## 2021-05-25 DIAGNOSIS — R5383 Other fatigue: Secondary | ICD-10-CM

## 2021-05-25 DIAGNOSIS — I679 Cerebrovascular disease, unspecified: Secondary | ICD-10-CM | POA: Diagnosis not present

## 2021-05-25 DIAGNOSIS — R4189 Other symptoms and signs involving cognitive functions and awareness: Secondary | ICD-10-CM | POA: Diagnosis not present

## 2021-05-25 MED ORDER — OZEMPIC (2 MG/DOSE) 8 MG/3ML ~~LOC~~ SOPN: 2.0000 mg | PEN_INJECTOR | SUBCUTANEOUS | 11 refills | Status: DC

## 2021-05-25 MED ORDER — BASAGLAR KWIKPEN 100 UNIT/ML ~~LOC~~ SOPN: 80.0000 [IU] | PEN_INJECTOR | Freq: Every day | SUBCUTANEOUS | 3 refills | Status: DC

## 2021-05-25 NOTE — Progress Notes (Signed)
? ? ?  S:    ? ?Chief Complaint  ?Patient presents with  ? Medication Management  ?  Diabetes - Ozempic/ Insulin  ? ? ?Amber [name] is a 51 y.o. female who presents for diabetes evaluation, education, and management. PMH is significant for ocular bleed and stroke. Patient was referred and last seen by pharmacy clinic  on 05/04/2021.  At last visit, we attempted .  ? ?Today, She arrives in good spirits and presents with assistance using a cane. ? ?Current diabetes medications include: insulin glargine, Novolog (insulin aspart), recently restarted Ozempic 1 mg with first dose yesterday  ?Current hypertension medications include: lisinopril-HCTZ ?Current hyperlipidemia medications include: atorvastatin ? ?Patient states that She is taking her medications as prescribed. Patient reports adherence with medications. Patient states that She misses her medications rarely. ? ?Do you feel that your medications are working for you? yes ?Have you been experiencing any side effects to the medications prescribed? no ? ?Patient denies hypoglycemic events. ? ?Reported home fasting blood sugars: elevated recently, however much improved this morning following first dose of Ozempic.  ? ?Patient reported dietary habits: Eats 3 meals/day ? ?Patient-reported exercise habits: Pt reports recently having cataract surgery and is waiting for clearance from MD to start exercising again. Pt reports that when able to start, she will go to [employer] 4x/week.  ? ? ?O:  ?Physical Exam ?Constitutional:   ?   Appearance: Normal appearance.  ?Pulmonary:  ?   Effort: Pulmonary effort is normal.  ?Neurological:  ?   Mental Status: She is alert.  ?Psychiatric:     ?   Mood and Affect: Mood normal.     ?   Behavior: Behavior normal.     ?   Thought Content: Thought content normal.     ?   Judgment: Judgment normal.  ? ? ?Review of Systems  ?Constitutional:  Positive for malaise/fatigue.  ?Eyes:   ?     Pt reports continued visual improvement post  cataract surgery.  ? ? ?Lab Results  ?Component Value Date  ? HGBA1C 9.4 (A) 04/27/2021  ? ?There were no vitals filed for this visit. ? ?Lipid Panel  ?   ?Component Value Date/Time  ? CHOL 129 03/30/2021 1152  ? TRIG 101 03/30/2021 1152  ? HDL 42 03/30/2021 1152  ? CHOLHDL 3.1 03/30/2021 1152  ? CHOLHDL 7.1 09/25/2020 0348  ? VLDL 75 (H) 09/25/2020 0348  ? Cetronia 68 03/30/2021 1152  ? LDLDIRECT 78 01/26/2021 1122  ? ? ?A/P: ?Diabetes longstanding currently with variable control secondary to dietary indiscretion, mobility. Patient is able to verbalize appropriate hypoglycemia management plan. Medication adherence appears optimal. Control is suboptimal due to medication regimen. Pt was unable to obtain Monjaro (tirzepatide) due to insurance formulary. ?-Continued basal insulin glargine (Basaglar) at 80 units QD. ?-Continued rapid insulin aspart (Novolog) at 20-60 units with meals.  ?-Restarted GLP-1 Ozempic (semaglutide) yesterday. 1 mg continued for next week and plan to increase to 2 mg following week.  ?-Extensively discussed pathophysiology of diabetes, recommended lifestyle interventions, dietary effects on blood sugar control. (With plan to reinitiate exercise and keto diet). ?-Counseled on s/sx of and management of hypoglycemia.  ? ?Written patient instructions provided. Patient verbalized understanding of treatment plan. Total time in face to face counseling 24 minutes.   ? ?Follow up pharmacistin 1-3 months per PCP.  Patient seen with Earvin Hansen PharmD Candidate.  ? ?

## 2021-05-25 NOTE — Assessment & Plan Note (Signed)
Compliant with CPAP 

## 2021-05-25 NOTE — Telephone Encounter (Signed)
Called patient and LVM that form was ready for pick up. Front page has missing patient information that needs to be completed.  ? ?Veronda Prude, RN ? ?

## 2021-05-25 NOTE — Progress Notes (Signed)
Reviewed: I agree with Dr. Koval's documentation and management. 

## 2021-05-25 NOTE — Assessment & Plan Note (Signed)
At goal today.  Continue current therapy.  BMP at follow-up. ?

## 2021-05-25 NOTE — Progress Notes (Signed)
? ? ?SUBJECTIVE:  ? ?CHIEF COMPLAINT: follow up medications, paperwork ?HPI:  ? ?Amber [name] is a 51 y.o.  with history notable for CVA, type 2 diabetes on insulin, hypertension, obesity, and urinary dysfunction presenting for medication follow up. ? ?Diabetes ?Blood sugars range from low vlues during which time she was symptomatic to high 400s.  Morning blood glucose this morning was 169.  She denies polyuria or polydipsia.  She denies signs or symptoms of a yeast infection ?Current medications: Insulin Glargine 40 U BID, semglutide  ?The patient reports she ran out of her naltrexone and then got back on carbs.  She is determined to get back on the ketogenic diet and get to the gym later this month.  She is awaiting clearance by her ophthalmologist to return to activity.  ? ?Hypertension ?Current medications include ACE inhibitor and HCTZ. No falls or side effects.  ? ?Fatigue  ?Patient continues to endorse excessive fatigue.  She is fine when she gets up in the morning but after just 30 minutes or an hour of activity she feels wiped.  She spent the day with her mom and then felt like she needed to nap for a long time.  No vaginal bleeding and no melena no hematochezia.  No new changes in medications.  She is compliant with her CPAP therapy.  This is new onset since the stroke. ?PERTINENT  PMH / PSH/Family/Social History : Updated and reviewed did note that several family members have dementia. ? ?OBJECTIVE:  ? ?BP (!) 118/58   Ht 5\' 7"  (1.702 m)   Wt 284 lb (128.8 kg)   BMI 44.48 kg/m?   ?Today's weight:  ?Last Weight  Most recent update: 05/25/2021 10:14 AM  ? ? Weight  ?128.8 kg (284 lb)  ?      ? ?  ? ?Review of prior weights: ?Filed Weights  ? 05/25/21 0946  ?Weight: 284 lb (128.8 kg)  ? ?Pleasant appearing woman in no distress. ?Left eye with minimal conjunctival injection. ?Speech has some delay and hesitancy to it.  She is able to carry on a basic conversation but when asked to come up with new ideas  she hesitates significantly. ?Difficulty with fine motor movements again today particularly has cramping in her right hand and digits 1 and 2 of her left hand. ?Regular rate and rhythm no murmurs rubs or gallops on cardiac exam.  Pulmonary exam-lungs clear bilaterally. ? ?ASSESSMENT/PLAN:  ? ?HTN (hypertension) ?At goal today.  Continue current therapy.  BMP at follow-up. ? ?DM2 (diabetes mellitus, type 2) ([place]) ?Blood glucose is elevated when she ran out of naltrexone.  Discussed management of hypoglycemia.  Semaglutide adjusted by pharmacy today.  Appreciate their management and care.  Follow-up in 1 month to monitor blood sugars and see where she is at with weight and glucose log.  If A1c not improved at follow-up we will refer back to Dr. Valentina Lucks vs. Endocrine.  Discussed long-term complications including vision impairment and cerebrovascular/CV events of which she is very aware. ?  ?Excess fatigue, does not necessarily seem mood related.  Suspect this is a sequela of the stroke in her stamina.  Last CBC was normal but in September--as on antiplatelet we will recheck.  We will repeat a thyroid level.  She is very compliant with her CPAP therapy and I doubt this is the cause. ? ?HCM advance care planning packet given today.  At follow-up discussed going to gastroenterology once she is cleared by her ophthalmologist. ?A1c  in early may  ? ? ? ?Dorris Singh, MD  ?Family Medicine Teaching Service  ?[facility]  ? ? ?

## 2021-05-25 NOTE — Assessment & Plan Note (Signed)
Blood glucose is elevated when she ran out of naltrexone.  Discussed management of hypoglycemia.  Semaglutide adjusted by pharmacy today.  Appreciate their management and care.  Follow-up in 1 month to monitor blood sugars and see where she is at with weight and glucose log.  If A1c not improved at follow-up we will refer back to Dr. Raymondo Band vs. Endocrine.  Discussed long-term complications including vision impairment and cerebrovascular/CV events of which she is very aware. ?

## 2021-05-25 NOTE — Patient Instructions (Signed)
Great to see you today! ? ?Take Ozempic (Semaglutide) 1mg  next Sunday 3/12 ?The following week take 2mg  (by taking two of the 1mg  doses.  ?I will send new prescription to your pharmacy for the 2mg .  ? ?Follow-up with me in 1-3 months.  ?

## 2021-05-25 NOTE — Assessment & Plan Note (Signed)
Diabetes longstanding currently with variable control secondary to dietary indiscretion, mobility. Patient is able to verbalize appropriate hypoglycemia management plan. Medication adherence appears optimal. Control is suboptimal due to medication regimen. Pt was unable to obtain Monjaro (tirzepatide) due to insurance formulary. ?-Continued basal insulin glargine (Basaglar) at 80 units QD. ?-Continued rapid insulin aspart (Novolog) at 20-60 units with meals.  ?-Restarted GLP-1 Ozempic (semaglutide) yesterday. 1 mg continued for next week and plan to increase to 2 mg following week.  ?-Extensively discussed pathophysiology of diabetes, recommended lifestyle interventions, dietary effects on blood sugar control. (With plan to reinitiate exercise and keto diet). ?-Counseled on s/sx of and management of hypoglycemia.  ?

## 2021-05-25 NOTE — Patient Instructions (Signed)
It was wonderful to see you today. ? ?Please bring ALL of your medications with you to every visit.  ? ?Today we talked about: ? ?-Complete an advance directive with your mom ? ?--follow up in 1 month about diabetes and fatigue  ? ? ? ? ?Thank you for choosing [facility].  ? ?Please call [phone] with any questions about today's appointment. ? ?Please be sure to schedule follow up at the front  desk before you leave today.  ? ?Dorris Singh, MD  ?Family Medicine  ? ?

## 2021-05-26 ENCOUNTER — Encounter: Payer: Self-pay | Admitting: Family Medicine

## 2021-05-26 LAB — CBC
Hematocrit: 41.7 % (ref 34.0–46.6)
Hemoglobin: 13.3 g/dL (ref 11.1–15.9)
MCH: 25 pg — ABNORMAL LOW (ref 26.6–33.0)
MCHC: 31.9 g/dL (ref 31.5–35.7)
MCV: 78 fL — ABNORMAL LOW (ref 79–97)
Platelets: 278 10*3/uL (ref 150–450)
RBC: 5.33 x10E6/uL — ABNORMAL HIGH (ref 3.77–5.28)
RDW: 14.4 % (ref 11.7–15.4)
WBC: 11.2 10*3/uL — ABNORMAL HIGH (ref 3.4–10.8)

## 2021-05-26 LAB — TSH: TSH: 1.2 u[IU]/mL (ref 0.450–4.500)

## 2021-05-27 ENCOUNTER — Telehealth: Payer: Self-pay | Admitting: Family Medicine

## 2021-05-27 ENCOUNTER — Other Ambulatory Visit: Payer: Self-pay | Admitting: Physical Medicine & Rehabilitation

## 2021-05-27 NOTE — Telephone Encounter (Signed)
Attempted to call patient to follow up briefly on mychart and her mother's condition. ? ?Terisa Starr, MD  ?Family Medicine Teaching Service  ? ?

## 2021-05-28 ENCOUNTER — Other Ambulatory Visit: Payer: Self-pay

## 2021-05-28 ENCOUNTER — Ambulatory Visit: Payer: BC Managed Care – PPO

## 2021-05-28 DIAGNOSIS — R4701 Aphasia: Secondary | ICD-10-CM | POA: Diagnosis not present

## 2021-05-28 DIAGNOSIS — R41841 Cognitive communication deficit: Secondary | ICD-10-CM

## 2021-05-28 NOTE — Therapy (Signed)
[facility], [place], [address] ?Phone: [phone]   Fax:  [phone] ? ?Speech Language Pathology Treatment/Discharge ? ?Patient Details  ?Name: Amber [name] ?MRN: [id number] ?Date of Birth: 17-Sep-1970 ?Referring Provider (SLP): Martyn Malay, MD ? ? ?Encounter Date: 05/28/2021 ? ? End of Session - 05/28/21 1029   ? ? Visit Number 9   ? Number of Visits 13   ? Date for SLP Re-Evaluation 06/25/21   ? Authorization Type BCBS   ? SLP Start Time 1017   ? SLP Stop Time  1055   ? SLP Time Calculation (min) 38 min   ? Activity Tolerance Patient tolerated treatment well   ? ?  ?  ? ?  ? ? ?Past Medical History:  ?Diagnosis Date  ? Cerebrovascular disease   ? Depression   ? DM2 (diabetes mellitus, type 2) ([place])   ? HLD (hyperlipidemia)   ? HTN (hypertension)   ? ? ?Past Surgical History:  ?Procedure Laterality Date  ? right eye surgery Right   ? had retinal hemorrhage  ? ? ?There were no vitals filed for this visit. ? ? Subjective Assessment - 05/28/21 1036   ? ? Subjective "I'm doing well"   ? Currently in Pain? No/denies   ? ?  ?  ? ?  ? ?SPEECH THERAPY DISCHARGE SUMMARY ? ?Visits from Start of Care: 9 ? ?Current functional level related to goals / functional outcomes: ?Amber [name] presents with improvements in cognitive linguistic functioning, including improved recall, awareness, attention, and word finding given ST intervention and training. Pt continues to successfully implement SLP recommendations and strategies with good carryover exhibited to improve daily functioning. Pt is agreeable to ST discharge this date as pt has met all ST goals.  ?  ?Remaining deficits: ?Occasional word finding/inattention related to topic maintenance  ?  ?Education / Equipment: ?Aeronautical engineer, external/internal aids, metacognitive tasks, caregiver education   ? ?Patient agrees to discharge. Patient goals were met. Patient is being discharged due to  meeting the stated rehab goals.. ? ? ? ? ? ? ADULT SLP TREATMENT - 05/28/21 1041   ? ?  ? General Information  ? Behavior/Cognition Alert;Cooperative;Pleasant mood   ?  ? Treatment Provided  ? Treatment provided Cognitive-Linquistic   ?  ? Cognitive-Linquistic Treatment  ? Treatment focused on Cognition;Aphasia   ? Skilled Treatment Pt reports increasing carryover of SLP recommendations for word finding. Pt presented 1 & 2 minute conversations with one episode of diverted attention, in which pt able to backtrack and re-focus with mod I. No other word finding episodes/deviations in attention noted in extended conversation. Continued success implementing SLP recommendations and strategies reported for attention/recall/awareness. SLP re-administered Cognitive Function PROM with score of 102 (35 point increase). Pt is pleased with current progress and has met all ST goals.   ?  ? Assessment / Recommendations / Plan  ? Plan Discharge SLP treatment due to (comment)   pt met ST goals  ?  ? Progression Toward Goals  ? Progression toward goals Goals met, education completed, patient discharged from SLP   ? ?  ?  ? ?  ? ? ? SLP Education - 05/28/21 1045   ? ? Education Details discharge summary, SLP recommendations   ? Person(s) Educated Patient   ? Methods Explanation;Demonstration   ? Comprehension Verbalized understanding;Returned demonstration   ? ?  ?  ? ?  ? ? ? SLP Short Term Goals - 05/28/21 1029   ? ?  ?  SLP SHORT TERM GOAL #1  ? Title Pt will complete CLQT and PROM in first ST session   ? Baseline CF: 67   ? Status Achieved   ?  ? SLP SHORT TERM GOAL #2  ? Title Pt will verbalize implementation of 2 memory/attention compensations to aid completion of daily tasks given occasional min A over 2 sessions   ? Baseline 04-15-21, 04-30-21   ? Status Achieved   ?  ? SLP SHORT TERM GOAL #3  ? Title Pt will utilize comprehension/processing strategies in 10+ minute conversation to optimize engagement and understanding given  occasional min A over 2 sessions   ? Baseline 04-15-21, 04-30-21   ? Status Achieved   ?  ? SLP SHORT TERM GOAL #4  ? Title Pt will complete mod complex naming tasks with 80% accuracy given rare min A over 2 sessions   ? Baseline 04-15-21, 04-23-21   ? Status Achieved   ?  ? SLP SHORT TERM GOAL #5  ? Title Pt will use word finding compensations in 10+ minute conversation given occasional min A over 2 sessions   ? Baseline 04-23-21, 2-9-2   ? Status Achieved   ? ?  ?  ? ?  ? ? ? SLP Long Term Goals - 05/28/21 1030   ? ?  ? SLP LONG TERM GOAL #1  ? Title Pt will verbalize implementation of 4 memory/attention compensations to aid completion of daily tasks given rare min A over 2 sessions   ? Baseline 05-07-21, 05-28-21   ? Status Achieved   ?  ? SLP LONG TERM GOAL #2  ? Title Pt will utilize comprehension/processing strategies in 20+ minute conversation to optimize engagement and understanding given rare min A over 2 sessions   ? Baseline 05-14-21, 05-28-21   ? Status Achieved   ?  ? SLP LONG TERM GOAL #3  ? Title Pt will use word finding compensations in 20+ minute conversation given rare min A over 2 sessions   ? Baseline 05-14-21, 05-28-21   ? Status Achieved   ? ?  ?  ? ?  ? ? ? Plan - 05/28/21 1029   ? ? Clinical Impression Statement "Amber [name]" was referred by [facility] as pt was experiencing high level cognitive deficits s/p CVA in July 2022. Pt has exhibited good improvements in verbal expression, awareness, recall, and attention with rare cues required at this time. Pt is successfully implementing use of compensations to aid accurate completion of tasks given increased awareness of how to modify tasks for success. Occasional word finding still reported; however, pt able to compensate more effectively at this time. Pt is pleased with current progress and agreeable to ST discharge this date.   ? Speech Therapy Frequency 1x /week   Pt request  ? Duration 12 weeks   ? Treatment/Interventions Multimodal communcation approach;Cognitive  reorganization;SLP instruction and feedback;Internal/external aids;Compensatory techniques;Language facilitation;Functional tasks;Patient/family education;Compensatory strategies   ? Potential to Achieve Goals Good   ? Consulted and Agree with Plan of Care Family member/caregiver;Patient   ? ?  ?  ? ?  ? ? ?Patient will benefit from skilled therapeutic intervention in order to improve the following deficits and impairments:   ?Cognitive communication deficit ? ?Aphasia ? ? ? ?Problem List ?Patient Active Problem List  ? Diagnosis Date Noted  ? OSA (obstructive sleep apnea) 03/30/2021  ? Morbid obesity ([place]) 12/29/2020  ? Left pontine cerebrovascular accident ([place]) 10/01/2020  ? HTN (hypertension)   ? DM2 (diabetes  mellitus, type 2) ([place])   ? HLD (hyperlipidemia)   ? Depression   ? ? ?Marzetta Board, CCC-SLP ?05/28/2021, 10:58 AM ? ?[facility] ?[place], [place], [address] ?Phone: [phone]   Fax:  [phone] ? ? ?Name: Amber [name] ?MRN: [id number] ?Date of Birth: 1970-04-12 ? ?

## 2021-06-04 ENCOUNTER — Encounter: Payer: Self-pay | Admitting: Family Medicine

## 2021-06-04 MED ORDER — LISINOPRIL-HYDROCHLOROTHIAZIDE 20-12.5 MG PO TABS: 1.0000 | ORAL_TABLET | Freq: Every day | ORAL | 3 refills | Status: DC

## 2021-06-13 ENCOUNTER — Encounter: Payer: Self-pay | Admitting: Family Medicine

## 2021-06-17 ENCOUNTER — Ambulatory Visit
Admission: RE | Admit: 2021-06-17 | Discharge: 2021-06-17 | Disposition: A | Discharge status: Home / Self Care | Payer: BC Managed Care – PPO | Source: Ambulatory Visit | Attending: Family Medicine | Admitting: Family Medicine

## 2021-06-17 ENCOUNTER — Ambulatory Visit (INDEPENDENT_AMBULATORY_CARE_PROVIDER_SITE_OTHER): Payer: BC Managed Care – PPO | Admitting: Family Medicine

## 2021-06-17 ENCOUNTER — Other Ambulatory Visit: Payer: Self-pay

## 2021-06-17 VITALS — BP 156/89 | HR 101 | Wt 284.0 lb

## 2021-06-17 DIAGNOSIS — M25551 Pain in right hip: Secondary | ICD-10-CM | POA: Diagnosis not present

## 2021-06-17 MED ORDER — BACLOFEN 5 MG PO TABS: 5.0000 mg | ORAL_TABLET | Freq: Three times a day (TID) | ORAL | 0 refills | Status: DC | PRN

## 2021-06-17 MED ORDER — DICLOFENAC SODIUM 1 % EX GEL: 4.0000 g | Freq: Four times a day (QID) | CUTANEOUS | Status: AC | PRN

## 2021-06-17 NOTE — Patient Instructions (Addendum)
It was nice seeing you today! ? ?Referral to sports medicine. ? ?Go get your X-ray any time. You do not need an appointment. ?[facility] ?Address: [address], [place], [place] [address] ?Phone: [phone]  ? ?Take Voltaren gel as needed. Try baclofen as needed. Stop Flexeril. ? ?Stay well, ?Littie Deeds, MD ?[facility] ?([phone] ? ?-- ? ?Make sure to check out at the front desk before you leave today. ? ?Please arrive at least 15 minutes prior to your scheduled appointments. ? ?If you had blood work today, I will send you a MyChart message or a letter if results are normal. Otherwise, I will give you a call. ? ?If you had a referral placed, they will call you to set up an appointment. Please give [place] a call if you don't hear back in the next 2 weeks. ? ?If you need additional refills before your next appointment, please call your pharmacy first.  ?

## 2021-06-17 NOTE — Progress Notes (Signed)
? ? ?SUBJECTIVE:  ? ?CHIEF COMPLAINT / HPI:  ?Chief Complaint  ?Patient presents with  ? Hip Pain  ?  ?Patient presents with sudden onset right lateral hip pain that occurred about 1.5 weeks ago while patient was walking from her bedroom to the living room at home.  She felt sudden pain and also noted bruising on her lateral hip.  She has still been ambulating but having to use her walker (recently transitioning from cane to no walking support, recovering from recent stroke July 2022). Has developed some left hip discomfort as well. She has been taking Tylenol and cyclobenzaprine with minimal relief. No further episodes of pain. Feels some improvement in pain when lying on the right side.  She is concerned about a hip fracture and wants x-rays. ? ? ?PERTINENT  PMH / PSH: HTN, CVA, T2DM, HLD, obesity, OSA ? ?Patient Care Team: ?Westley Chandler, MD as PCP - General (Family Medicine)  ? ?OBJECTIVE:  ? ?BP (!) 156/89   Pulse (!) 101   Wt 284 lb (128.8 kg)   LMP 04/19/2021 (Approximate)   BMI 44.48 kg/m?   ?Physical Exam ?Constitutional:   ?   General: She is not in acute distress. ?   Appearance: Normal appearance. She is obese.  ?   Comments: Seated in chair comfortably.  ?HENT:  ?   Head: Normocephalic and atraumatic.  ?Cardiovascular:  ?   Rate and Rhythm: Normal rate and regular rhythm.  ?   Pulses:     ?     Dorsalis pedis pulses are 2+ on the right side and 2+ on the left side.  ?   Heart sounds: Normal heart sounds.  ?Pulmonary:  ?   Effort: Pulmonary effort is normal. No respiratory distress.  ?   Breath sounds: Normal breath sounds.  ?Musculoskeletal:  ?   Cervical back: Neck supple.  ?   Comments: Ecchymosis noted on the lateral aspect of the right hip.  There is mild tenderness to palpation at the right lateral hip.  Full range of motion with internal and external rotation at the hip bilaterally without pain.  Full strength with abduction on the left.  She is unable to abduct the right hip against  gravity with significant pain when attempting this.  Otherwise she has full strength with hip flexion and extension bilaterally.  Pain with FABER testing on the right. ? ?Bilateral knees without any obvious deformity or swelling.  No joint line tenderness.  Full range of motion with extension and flexion.  Full strength with flexion and extension at the knee.  Negative anterior and posterior drawer.  ?Neurological:  ?   Mental Status: She is alert.  ?   Deep Tendon Reflexes:  ?   Reflex Scores: ?     Patellar reflexes are 2+ on the right side and 2+ on the left side. ?     Achilles reflexes are 2+ on the right side and 2+ on the left side. ?   Comments: Gait is somewhat antalgic but able to ambulate with walker without significant pain.  ?  ? ? ?  06/17/2021  ?  9:13 AM  ?Depression screen PHQ 2/9  ?Decreased Interest 0  ?Down, Depressed, Hopeless 0  ?PHQ - 2 Score 0  ?Altered sleeping 3  ?Tired, decreased energy 2  ?Change in appetite 0  ?Feeling bad or failure about yourself  0  ?Trouble concentrating 2  ?Moving slowly or fidgety/restless 0  ?Suicidal thoughts 0  ?  PHQ-9 Score 7  ?Difficult doing work/chores Not difficult at all  ?  ? ? ? ?{Show previous vital signs (optional):23777} ? ? ? ?ASSESSMENT/PLAN:  ? ?Right lateral hip pain ?Unusual presentation of atraumatic right lateral hip pain while ambulating associated with bruising.  I wonder if she had some sort of spontaneous tendon or muscular injury given associated bruising and weakness with abduction which would be better evaluated with ultrasound. SI joint pathology also a consideration given exam though may be difficult to assess with pain. Low suspicion of hip fracture based on history and exam but will proceed with XR imaging given the visual presentation. ?- XR right hip ?- urgent referral sports medicine ?- diclofenac gel (can rx if needed, pt has some at home) ?- trial baclofen prn, d/c cyclobenzaprine ? ?Return if symptoms worsen or fail to improve.   ? ?Amber Deeds, MD ?[facility]  ?

## 2021-06-18 ENCOUNTER — Encounter: Payer: Self-pay | Admitting: Family Medicine

## 2021-06-22 ENCOUNTER — Ambulatory Visit: Payer: Self-pay

## 2021-06-22 ENCOUNTER — Ambulatory Visit (INDEPENDENT_AMBULATORY_CARE_PROVIDER_SITE_OTHER): Payer: BC Managed Care – PPO | Admitting: Family Medicine

## 2021-06-22 ENCOUNTER — Encounter: Payer: Self-pay | Admitting: Psychology

## 2021-06-22 VITALS — BP 143/79 | Ht 67.0 in | Wt 284.0 lb

## 2021-06-22 DIAGNOSIS — M25551 Pain in right hip: Secondary | ICD-10-CM

## 2021-06-22 NOTE — Patient Instructions (Signed)
Your ultrasound looks better than expected. ?You have a little fluid over the gluteus medius tendon suggesting a partial tear but not a complete tear. ?This should improve with time, rehab, medications, icing. ?Ice 15 minutes at a time 3-4 times a day. ?Tylenol 500mg  1-2 tabs three times a day - consider taking regularly for a week then as needed. ?Voltaren gel up to 4 times a day topically. ?When you're able to start the strengthening exercises - I'd expect it will be a few days before you can do this. ?Continue using the cane for support. ?Follow up with me in 2 weeks for reevaluation. ?We can consider physical therapy in the future if not improving enough with the home exercises. ?

## 2021-06-23 ENCOUNTER — Encounter: Payer: Self-pay | Admitting: Family Medicine

## 2021-06-23 NOTE — Progress Notes (Signed)
PCP: Martyn Malay, MD ? ?Subjective:  ? ?HPI: ?Patient is a 51 y.o. female here for right hip pain. ? ?Patient reports about 2 weeks ago she was walking when she felt a crunching sound lateral right hip and notable weakness of her right leg so she couldn't bear weight. ?She developed bruising and swelling lateral hip/gluteal region. ?She does take a baby aspirin daily. ?Did not fall onto this hip. ?No back pain concurrent with this. ?Has improved from 10/10 to 7/10 level of pain since this started. ?Has been using a cane to help with ambulation. ? ?Past Medical History:  ?Diagnosis Date  ? Cerebrovascular disease   ? Depression   ? DM2 (diabetes mellitus, type 2) ([place])   ? HLD (hyperlipidemia)   ? HTN (hypertension)   ? ? ?Current Outpatient Medications on File Prior to Visit  ?Medication Sig Dispense Refill  ? acetaminophen (TYLENOL) 500 MG tablet Take 500 mg by mouth every 6 (six) hours as needed.    ? aspirin EC 81 MG EC tablet Take 1 tablet (81 mg total) by mouth daily. Swallow whole. 30 tablet 11  ? atorvastatin (LIPITOR) 80 MG tablet Take 1 tablet (80 mg total) by mouth at bedtime. 90 tablet 3  ? baclofen 5 MG TABS Take 5 mg by mouth 3 (three) times daily as needed for muscle spasms. 30 tablet 0  ? diclofenac Sodium (VOLTAREN) 1 % GEL Apply 4 g topically 4 (four) times daily as needed.    ? insulin aspart (NOVOLOG FLEXPEN) 100 UNIT/ML FlexPen Inject 20-60 Units into the skin 2 (two) times daily with a meal. 15 mL 11  ? Insulin Glargine (BASAGLAR KWIKPEN) 100 UNIT/ML Inject 80 Units into the skin daily. 15 mL 3  ? Insulin Pen Needle (BD ULTRA-FINE PEN NEEDLES) 29G X 12.7MM MISC     ? lisinopril-hydrochlorothiazide (ZESTORETIC) 20-12.5 MG tablet Take 1 tablet by mouth daily. 90 tablet 3  ? naltrexone (DEPADE) 50 MG tablet Take 1 tablet (50 mg total) by mouth daily. 30 tablet 2  ? nystatin-triamcinolone (MYCOLOG II) cream Apply 1 application topically 2 (two) times daily. (Patient not taking: Reported on  05/25/2021) 240 g 0  ? oxybutynin (DITROPAN-XL) 10 MG 24 hr tablet TAKE 1 TABLET(10 MG) BY MOUTH AT BEDTIME 90 tablet 0  ? Semaglutide, 1 MG/DOSE, (OZEMPIC, 1 MG/DOSE,) 4 MG/3ML SOPN Inject 1 mg into the skin once a week. 3 mL 3  ? Semaglutide, 2 MG/DOSE, (OZEMPIC, 2 MG/DOSE,) 8 MG/3ML SOPN Inject 2 mg into the skin once a week. 3 mL 11  ? sertraline (ZOLOFT) 100 MG tablet Take 1 tablet (100 mg total) by mouth daily. 30 tablet 0  ? ?No current facility-administered medications on file prior to visit.  ? ? ?Past Surgical History:  ?Procedure Laterality Date  ? right eye surgery Right   ? had retinal hemorrhage  ? ? ?Allergies  ?Allergen Reactions  ? Alpha-Gal Anaphylaxis, Hives, Shortness Of Breath and Swelling  ?  Meat; mammal protein  ? Metformin Diarrhea  ?   ?  ? Penicillins Other (See Comments)  ?  Unsure; reaction in childhood; patient desires not to take  ? ? ?BP (!) 143/79   Ht 5\' 7"  (1.702 m)   Wt 284 lb (128.8 kg)   BMI 44.48 kg/m?  ? ?   ? View : No data to display.  ?  ?  ?  ? ? ?   ? View : No data to display.  ?  ?  ?  ? ? ?    ?  Objective:  ?Physical Exam: ? ?Gen: NAD, comfortable in exam room ? ?Right hip: ?Resolving bruising posterior to greater trochanter.  No swelling, other deformity. ?FROM with 5/5 strength except 3/5 hip abduction. ?Tenderness to palpation posterior and superior to greater trochanter. ?NVI distally. ?Negative logroll ?Negative faber, fadir, and piriformis stretches. ?  ?Limited MSK u/s right hip:  No abnormalities of greater trochanter.  External rotators look intact with some anisotrophy lateral aspect of these muscles.  Mild bursitis overlying glute med - in one view possible small tear at very distal insertion of glute minimus.  Otherwise normal. ? ?Assessment & Plan:  ?1. Right hip pain - occurred while walking, concurrent bruising.  Exam and ultrasound more reassuring than expected.  Radiographs negative for fracture.  Does have some bursitis on ultrasound and a possible  small distal insertional tear of glute min.  Should do well with conservative treatment.  Icing, tylenol, voltaren gel.  Wait a few days before starting strengthening exercises which were reviewed today.  Consider formal physical therapy in future.  Use cane for support.  F/u in 2 weeks. ?

## 2021-06-29 ENCOUNTER — Encounter: Payer: BC Managed Care – PPO | Attending: Physical Medicine & Rehabilitation | Admitting: Psychology

## 2021-06-29 DIAGNOSIS — F331 Major depressive disorder, recurrent, moderate: Secondary | ICD-10-CM | POA: Diagnosis present

## 2021-06-29 DIAGNOSIS — I639 Cerebral infarction, unspecified: Secondary | ICD-10-CM | POA: Diagnosis present

## 2021-06-29 DIAGNOSIS — F82 Specific developmental disorder of motor function: Secondary | ICD-10-CM | POA: Diagnosis present

## 2021-06-29 DIAGNOSIS — F909 Attention-deficit hyperactivity disorder, unspecified type: Secondary | ICD-10-CM | POA: Diagnosis present

## 2021-06-30 ENCOUNTER — Encounter: Payer: Self-pay | Admitting: Psychology
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
## 2021-07-06 ENCOUNTER — Ambulatory Visit (INDEPENDENT_AMBULATORY_CARE_PROVIDER_SITE_OTHER): Payer: BC Managed Care – PPO | Admitting: Family Medicine

## 2021-07-06 ENCOUNTER — Other Ambulatory Visit: Payer: Self-pay

## 2021-07-06 ENCOUNTER — Encounter: Payer: Self-pay | Admitting: Family Medicine

## 2021-07-06 VITALS — BP 150/76 | HR 84 | Wt 282.2 lb

## 2021-07-06 VITALS — BP 152/82 | Ht 67.0 in | Wt 284.0 lb

## 2021-07-06 DIAGNOSIS — D72829 Elevated white blood cell count, unspecified: Secondary | ICD-10-CM

## 2021-07-06 DIAGNOSIS — Z91018 Allergy to other foods: Secondary | ICD-10-CM | POA: Diagnosis not present

## 2021-07-06 DIAGNOSIS — E1159 Type 2 diabetes mellitus with other circulatory complications: Secondary | ICD-10-CM

## 2021-07-06 DIAGNOSIS — I10 Essential (primary) hypertension: Secondary | ICD-10-CM | POA: Diagnosis not present

## 2021-07-06 DIAGNOSIS — M25551 Pain in right hip: Secondary | ICD-10-CM

## 2021-07-06 MED ORDER — OZEMPIC (1 MG/DOSE) 4 MG/3ML ~~LOC~~ SOPN: 1.0000 mg | PEN_INJECTOR | SUBCUTANEOUS | 3 refills | Status: DC

## 2021-07-06 MED ORDER — SERTRALINE HCL 100 MG PO TABS: 150.0000 mg | ORAL_TABLET | Freq: Every day | ORAL | Status: DC

## 2021-07-06 NOTE — Assessment & Plan Note (Signed)
Continue current therapy.  Prescribed Ozempic 1 mg dose.  We will stay at this dose given intolerance to higher doses. ?

## 2021-07-06 NOTE — Progress Notes (Signed)
PCP: Westley Chandler, MD ? ?Subjective:  ? ?HPI: ?Patient is a 51 y.o. female here for follow up of right hip pain. ? ?Patient is now ~4 weeks removed from the onset of her right lateral hip pain, weakness, and "crunching". Seen on 4/3 and found to have some bursitis on ultrasound and a possible small distal insertional tear of glute min.  ? ?She notes significant improvement since her last visit. Has been performing home exercises regularly. Now pain free for about 1 week. Able to ambulate without a cane but still carries it for reassurance. Feels that her hip strength is improving. Still has some mild tenderness over the lateral hip. No clicking, popping, or catching. Not taking any pain medications.  ? ?Past Medical History:  ?Diagnosis Date  ? Cerebrovascular disease   ? Depression   ? DM2 (diabetes mellitus, type 2) (HCC)   ? HLD (hyperlipidemia)   ? HTN (hypertension)   ? ? ?Current Outpatient Medications on File Prior to Visit  ?Medication Sig Dispense Refill  ? acetaminophen (TYLENOL) 500 MG tablet Take 500 mg by mouth every 6 (six) hours as needed.    ? aspirin EC 81 MG EC tablet Take 1 tablet (81 mg total) by mouth daily. Swallow whole. 30 tablet 11  ? atorvastatin (LIPITOR) 80 MG tablet Take 1 tablet (80 mg total) by mouth at bedtime. 90 tablet 3  ? baclofen 5 MG TABS Take 5 mg by mouth 3 (three) times daily as needed for muscle spasms. 30 tablet 0  ? diclofenac Sodium (VOLTAREN) 1 % GEL Apply 4 g topically 4 (four) times daily as needed.    ? insulin aspart (NOVOLOG FLEXPEN) 100 UNIT/ML FlexPen Inject 20-60 Units into the skin 2 (two) times daily with a meal. 15 mL 11  ? Insulin Glargine (BASAGLAR KWIKPEN) 100 UNIT/ML Inject 80 Units into the skin daily. 15 mL 3  ? Insulin Pen Needle (BD ULTRA-FINE PEN NEEDLES) 29G X 12.7MM MISC     ? lisinopril-hydrochlorothiazide (ZESTORETIC) 20-12.5 MG tablet Take 1 tablet by mouth daily. 90 tablet 3  ? naltrexone (DEPADE) 50 MG tablet Take 1 tablet (50 mg total) by  mouth daily. 30 tablet 2  ? nystatin-triamcinolone (MYCOLOG II) cream Apply 1 application topically 2 (two) times daily. (Patient not taking: Reported on 05/25/2021) 240 g 0  ? oxybutynin (DITROPAN-XL) 10 MG 24 hr tablet TAKE 1 TABLET(10 MG) BY MOUTH AT BEDTIME 90 tablet 0  ? Semaglutide, 1 MG/DOSE, (OZEMPIC, 1 MG/DOSE,) 4 MG/3ML SOPN Inject 1 mg into the skin once a week. 3 mL 3  ? Semaglutide, 2 MG/DOSE, (OZEMPIC, 2 MG/DOSE,) 8 MG/3ML SOPN Inject 2 mg into the skin once a week. 3 mL 11  ? sertraline (ZOLOFT) 100 MG tablet Take 1 tablet (100 mg total) by mouth daily. 30 tablet 0  ? ?No current facility-administered medications on file prior to visit.  ? ? ?Past Surgical History:  ?Procedure Laterality Date  ? right eye surgery Right   ? had retinal hemorrhage  ? ? ?Allergies  ?Allergen Reactions  ? Alpha-Gal Anaphylaxis, Hives, Shortness Of Breath and Swelling  ?  Meat; mammal protein  ? Metformin Diarrhea  ?   ?  ? Penicillins Other (See Comments)  ?  Unsure; reaction in childhood; patient desires not to take  ? ? ?BP (!) 152/82   Ht 5\' 7"  (1.702 m)   Wt 284 lb (128.8 kg)   BMI 44.48 kg/m?  ? ?   ? View :  No data to display.  ?  ?  ?  ? ? ?   ? View : No data to display.  ?  ?  ?  ? ? ?    ?Objective:  ?Physical Exam: ? ?Gen: NAD, comfortable in exam room ?CV: Regular rate, well perfused ?Resp: No increased work of breathing, coughing or wheezing ?Psych: Normal mood and affect.  ?MSK - right hip: No swelling or obvious deformity. Mild TTP over the greater trochanter. Full ROM. 4/5 strength in hip abduction, 5/5 strength otherwise. Negative log roll. Neurovascularly intact distally.  ? ?Assessment & Plan:  ?1. Right hip pain and weakness - Patient is improving nicely with conservative treatment now 4 weeks removed from the onset of her right lateral hip pain and weakness. Did have evidence of some bursitis on ultrasound and a possible small distal insertional tear of glute min at her last appointment. Her strength  is improving with her home exercises and her pain is practically non-existent now. Patient to continue exercises and strengthening. Follow up as needed.  ? ? ?Festus Aloe ?MS4, [employer] of [facility] ?

## 2021-07-06 NOTE — Progress Notes (Signed)
? ? ?  SUBJECTIVE:  ? ?CHIEF COMPLAINT: blood pressure, alpha gal  ?HPI:  ? ?Decora Almanza is a 51 y.o.  with history notable for type 2 diabetes, cerebrovascular disease and hypertension presenting for follow-up and concerns about beef allergy. ? ?The patient has remote history of beef allergy.  This started when she was married to a prior person and got her levels checked which was elevated at 19.  She reports she then had beef in the hospital and noticed severe reaction.  She would like her levels checked today.  She entirely avoids beef at this point and would like to consider getting back to it to increase her protein somewhat. ? ?The patient reports compliance with her antihypertensives.  She does have a home blood pressure cuff.  She denies chest pain headaches vision changes.  ? ?The patient is determined to get back on a ketogenic diet.  Her blood sugars have been in the 100s to 140s.  She is really wanting to get off insulin.  She went up to 1.5 mg of Ozempic and had belching and GI distress.  She would like to consider going back to the 1 mg dose ? ?PERTINENT  PMH / PSH/Family/Social History : Type 2 diabetes ? ?OBJECTIVE:  ? ?BP (!) 150/76   Pulse 84   Wt 282 lb 3.2 oz (128 kg)   SpO2 99%   BMI 44.20 kg/m?   ?Today's weight:  ?Last Weight  Most recent update: 07/06/2021 10:23 AM  ? ? Weight  ?128 kg (282 lb 3.2 oz)  ?      ? ?  ? ?Review of prior weights: ?Filed Weights  ? 07/06/21 1022  ?Weight: 282 lb 3.2 oz (128 kg)  ? ?Gait is improved ? ?Cardiac: Regular rate and rhythm. Normal S1/S2. No murmurs, rubs, or gallops appreciated. ?Lungs: Clear bilaterally to ascultation.  ?Abdomen: Normoactive bowel sounds. No tenderness to deep or light palpation. No rebound or guarding.   ?Psych: Pleasant and appropriate  ? ? ?ASSESSMENT/PLAN:  ? ?HTN (hypertension) ?Not at goal.  She will send in home readings and we will adjust medications accordingly.  Could add amlodipine or increase Zestoretic. ? ?DM2  (diabetes mellitus, type 2) ([place]) ?Continue current therapy.  Prescribed Ozempic 1 mg dose.  We will stay at this dose given intolerance to higher doses. ?  ?History of beef intolerance, alpha gal IgE ordered today ? ?Leukocytosis, noted after last illness repeat ordered today. ? ?Depression, managed by psychiatry adjusted dose of sertraline to match what is prescribed ? ? ? ?Dorris Singh, MD  ?Family Medicine Teaching Service  ?[place]  ? ? ?

## 2021-07-06 NOTE — Patient Instructions (Addendum)
It was wonderful to see you today. ? ?Please bring ALL of your medications with you to every visit.  ? ?Today we talked about: ?- Staying with 1 mg of Ozempic ?- Great work on [employer] ?- I will call you or message you about blood work ?- Social security will request records for disability  ? ? ? ?Thank you for choosing [facility].  ? ?Please call [phone] with any questions about today's appointment. ? ?Please be sure to schedule follow up at the front  desk before you leave today.  ? ?Terisa Starr, MD  ?Family Medicine  ? ?Dr. Truman Hayward- Pediatric Endocrinologist  ?Below are a list of gender-affirming therapists ?Insurance may cover some of the costs. Please check with your insurance.  ? ?[facility]; [email or web address]   ? ?[facility]; [email or web address]   ? ?[facility]; adrifza.com   ? ?[facility]; https://www.psychologytoday.com/us/therapists/lisa-pleasants[place]/282441   ? ?Offers dialectical behavioral therapy (DBT) for groups of teens or adults  ? ?[facility]; http://www.fitzpatrick.net/  ? ?[facility]; https://sunriseamanecerservices.org   ? ?Spanish-speaking  ? ?[facility]; https://www.[email or web address].com   ? ?Expertise in disordered eating and body image therapy  ? ?[facility]; https://tlc-counseling.com   ? ?[facility]; https://www.youth[facility].org/[facility]-[email or web address]-counseling/   ? ? ?

## 2021-07-06 NOTE — Assessment & Plan Note (Signed)
Not at goal.  She will send in home readings and we will adjust medications accordingly.  Could add amlodipine or increase Zestoretic. ?

## 2021-07-07 ENCOUNTER — Encounter: Payer: Self-pay | Admitting: Family Medicine

## 2021-07-07 LAB — CBC WITH DIFFERENTIAL/PLATELET
Basophils Absolute: 0.1 10*3/uL (ref 0.0–0.2)
Basos: 1 %
EOS (ABSOLUTE): 0.2 10*3/uL (ref 0.0–0.4)
Eos: 2 %
Hematocrit: 41.3 % (ref 34.0–46.6)
Hemoglobin: 13.3 g/dL (ref 11.1–15.9)
Immature Grans (Abs): 0.1 10*3/uL (ref 0.0–0.1)
Immature Granulocytes: 1 %
Lymphocytes Absolute: 2.5 10*3/uL (ref 0.7–3.1)
Lymphs: 22 %
MCH: 24.6 pg — ABNORMAL LOW (ref 26.6–33.0)
MCHC: 32.2 g/dL (ref 31.5–35.7)
MCV: 76 fL — ABNORMAL LOW (ref 79–97)
Monocytes Absolute: 0.5 10*3/uL (ref 0.1–0.9)
Monocytes: 5 %
Neutrophils Absolute: 7.8 10*3/uL — ABNORMAL HIGH (ref 1.4–7.0)
Neutrophils: 69 %
Platelets: 296 10*3/uL (ref 150–450)
RBC: 5.41 x10E6/uL — ABNORMAL HIGH (ref 3.77–5.28)
RDW: 15.1 % (ref 11.7–15.4)
WBC: 11.1 10*3/uL — ABNORMAL HIGH (ref 3.4–10.8)

## 2021-07-08 ENCOUNTER — Encounter: Payer: Self-pay | Admitting: Family Medicine

## 2021-07-08 LAB — O215-IGE ALPHA-GAL: O215-IgE Alpha-Gal: 38.5 kU/L — AB

## 2021-07-09 ENCOUNTER — Encounter: Payer: Self-pay | Admitting: Family Medicine

## 2021-07-09 ENCOUNTER — Other Ambulatory Visit: Payer: Self-pay | Admitting: Family Medicine

## 2021-07-09 DIAGNOSIS — Z91014 Allergy to mammalian meats: Secondary | ICD-10-CM

## 2021-07-09 MED ORDER — EPINEPHRINE 0.3 MG/0.3ML IJ SOAJ: 0.3000 mg | INTRAMUSCULAR | 1 refills | Status: DC | PRN

## 2021-07-12 LAB — ALPHA-GAL PANEL
Allergen Lamb IgE: 7.16 kU/L — AB
Beef IgE: 13.8 kU/L — AB
IgE (Immunoglobulin E), Serum: 101 IU/mL (ref 6–495)
O215-IgE Alpha-Gal: 37.3 kU/L — AB
Pork IgE: 5.28 kU/L — AB

## 2021-07-12 LAB — SPECIMEN STATUS REPORT

## 2021-07-13 ENCOUNTER — Encounter: Payer: Self-pay | Admitting: Family Medicine

## 2021-07-14 ENCOUNTER — Ambulatory Visit: Payer: BC Managed Care – PPO | Admitting: Psychology

## 2021-07-21 ENCOUNTER — Encounter: Payer: Self-pay | Admitting: Family Medicine

## 2021-07-24 ENCOUNTER — Telehealth: Payer: Self-pay | Admitting: Family Medicine

## 2021-07-24 NOTE — Telephone Encounter (Signed)
Reviewed, completed, and signed form.  Note routed to RN team inbasket and placed completed form in RN Wall pocket in the front office.  Karthika Glasper M Dianely Krehbiel, MD  

## 2021-07-24 NOTE — Telephone Encounter (Signed)
Patient dropped off disability form to be completed. Last DOS was 07/06/21. Placed in red folder ?

## 2021-07-28 ENCOUNTER — Encounter: Payer: Self-pay | Admitting: Family Medicine

## 2021-07-28 NOTE — Telephone Encounter (Signed)
Form placed up front for pick up.  ? ?Copy made for batch scanning.  ? ?VM left informing patient.  ?

## 2021-08-12 ENCOUNTER — Ambulatory Visit: Payer: BC Managed Care – PPO | Admitting: Adult Health

## 2021-08-20 ENCOUNTER — Encounter: Payer: Self-pay | Admitting: Family Medicine

## 2021-08-21 ENCOUNTER — Telehealth: Payer: Self-pay | Admitting: Family Medicine

## 2021-08-21 NOTE — Telephone Encounter (Signed)
Reviewed, completed, and signed form.  Note routed to RN team inbasket and placed completed form in RN Wall pocket in the front office.  Takenya Travaglini M Fabricio Endsley, MD  

## 2021-08-24 NOTE — Telephone Encounter (Signed)
Form placed up front for pick.   Copy made for batch scanning.   Mychart message sent.

## 2021-08-25 ENCOUNTER — Encounter: Payer: Self-pay | Admitting: *Deleted

## 2021-08-27 ENCOUNTER — Encounter: Payer: Self-pay | Admitting: Family Medicine

## 2021-08-27 DIAGNOSIS — E1159 Type 2 diabetes mellitus with other circulatory complications: Secondary | ICD-10-CM

## 2021-08-27 DIAGNOSIS — R638 Other symptoms and signs concerning food and fluid intake: Secondary | ICD-10-CM

## 2021-08-28 ENCOUNTER — Telehealth: Payer: Self-pay | Admitting: Family Medicine

## 2021-08-28 ENCOUNTER — Ambulatory Visit (INDEPENDENT_AMBULATORY_CARE_PROVIDER_SITE_OTHER): Payer: BC Managed Care – PPO | Admitting: Family Medicine

## 2021-08-28 ENCOUNTER — Encounter: Payer: Self-pay | Admitting: Family Medicine

## 2021-08-28 VITALS — BP 135/92 | HR 104 | Wt 276.8 lb

## 2021-08-28 DIAGNOSIS — Z1211 Encounter for screening for malignant neoplasm of colon: Secondary | ICD-10-CM | POA: Diagnosis not present

## 2021-08-28 DIAGNOSIS — E1159 Type 2 diabetes mellitus with other circulatory complications: Secondary | ICD-10-CM | POA: Diagnosis not present

## 2021-08-28 DIAGNOSIS — Z1212 Encounter for screening for malignant neoplasm of rectum: Secondary | ICD-10-CM

## 2021-08-28 DIAGNOSIS — D72829 Elevated white blood cell count, unspecified: Secondary | ICD-10-CM | POA: Diagnosis not present

## 2021-08-28 DIAGNOSIS — I10 Essential (primary) hypertension: Secondary | ICD-10-CM | POA: Diagnosis not present

## 2021-08-28 DIAGNOSIS — Z8673 Personal history of transient ischemic attack (TIA), and cerebral infarction without residual deficits: Secondary | ICD-10-CM

## 2021-08-28 LAB — POCT GLYCOSYLATED HEMOGLOBIN (HGB A1C): HbA1c, POC (controlled diabetic range): 10 % — AB (ref 0.0–7.0)

## 2021-08-28 MED ORDER — MOUNJARO 2.5 MG/0.5ML ~~LOC~~ SOAJ: 2.5000 mg | SUBCUTANEOUS | 1 refills | Status: DC

## 2021-08-28 MED ORDER — LISINOPRIL-HYDROCHLOROTHIAZIDE 20-12.5 MG PO TABS: 2.0000 | ORAL_TABLET | Freq: Every day | ORAL | 2 refills | Status: DC

## 2021-08-28 MED ORDER — AMLODIPINE BESYLATE 5 MG PO TABS: 5.0000 mg | ORAL_TABLET | Freq: Every day | ORAL | 3 refills | Status: DC

## 2021-08-28 MED ORDER — NOVOLOG FLEXPEN 100 UNIT/ML ~~LOC~~ SOPN: 40.0000 [IU] | PEN_INJECTOR | Freq: Two times a day (BID) | SUBCUTANEOUS | 11 refills | Status: DC

## 2021-08-28 NOTE — Telephone Encounter (Signed)
Reviewed, completed, and signed form.  Note routed to RN team inbasket and placed completed form in RN Wall pocket in the front office.  Teirra Carapia M Toniqua Melamed, MD  

## 2021-08-28 NOTE — Assessment & Plan Note (Signed)
At goal in office but not at home.  Given her age, very young, and risk factors recommend tight blood pressure control will start amlodipine 5 mg.
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
## 2021-08-28 NOTE — Assessment & Plan Note (Signed)
Continues to have issues with fine motor and memory.  Encouraged her to go to the gym as this could facilitate some recovery and potentially prevent future strokes.  She is looking forward to this.

## 2021-08-28 NOTE — Patient Instructions (Addendum)
It was wonderful to see you today.  Please bring ALL of your medications with you to every visit.   Today we talked about:  -- Going to see a Solicitor   - I sent in [place]  - I sent in a new blood pressure pill   Please schedule a follow up visit with Dr. Raymondo Band in 2-4 weeks  Follow up with me in 4-6 weeks    Thank you for choosing [facility].   Please call [phone] with any questions about today's appointment.  Please be sure to schedule follow up at the front  desk before you leave today.   Terisa Starr, MD  Family Medicine

## 2021-08-28 NOTE — Assessment & Plan Note (Signed)
Not at goal.  Discussed current regimen.  Likely needs to have long-acting glargine in divided doses.  Scheduled for pharmacy visit.  Could consider addition of SGLT2 inhibitor as well.  Stopped Ozempic.  Start Mounjaro given her elevated A1c despite GLP-1 therapy.  She is intolerant of metformin.

## 2021-08-29 ENCOUNTER — Other Ambulatory Visit: Payer: Self-pay | Admitting: Physical Medicine & Rehabilitation

## 2021-08-29 LAB — CBC WITH DIFFERENTIAL/PLATELET
Basophils Absolute: 0.1 10*3/uL (ref 0.0–0.2)
Basos: 1 %
EOS (ABSOLUTE): 0.2 10*3/uL (ref 0.0–0.4)
Eos: 2 %
Hematocrit: 44.6 % (ref 34.0–46.6)
Hemoglobin: 14.3 g/dL (ref 11.1–15.9)
Immature Grans (Abs): 0 10*3/uL (ref 0.0–0.1)
Immature Granulocytes: 0 %
Lymphocytes Absolute: 2.5 10*3/uL (ref 0.7–3.1)
Lymphs: 19 %
MCH: 25 pg — ABNORMAL LOW (ref 26.6–33.0)
MCHC: 32.1 g/dL (ref 31.5–35.7)
MCV: 78 fL — ABNORMAL LOW (ref 79–97)
Monocytes Absolute: 0.5 10*3/uL (ref 0.1–0.9)
Monocytes: 4 %
Neutrophils Absolute: 9.9 10*3/uL — ABNORMAL HIGH (ref 1.4–7.0)
Neutrophils: 74 %
Platelets: 342 10*3/uL (ref 150–450)
RBC: 5.72 x10E6/uL — ABNORMAL HIGH (ref 3.77–5.28)
RDW: 15.2 % (ref 11.7–15.4)
WBC: 13.3 10*3/uL — ABNORMAL HIGH (ref 3.4–10.8)

## 2021-08-29 LAB — BASIC METABOLIC PANEL
BUN/Creatinine Ratio: 19 (ref 9–23)
BUN: 16 mg/dL (ref 6–24)
CO2: 22 mmol/L (ref 20–29)
Calcium: 10 mg/dL (ref 8.7–10.2)
Chloride: 97 mmol/L (ref 96–106)
Creatinine, Ser: 0.84 mg/dL (ref 0.57–1.00)
Glucose: 298 mg/dL — ABNORMAL HIGH (ref 70–99)
Potassium: 4.8 mmol/L (ref 3.5–5.2)
Sodium: 133 mmol/L — ABNORMAL LOW (ref 134–144)
eGFR: 83 mL/min/{1.73_m2} (ref >59)

## 2021-08-31 ENCOUNTER — Telehealth: Payer: Self-pay

## 2021-08-31 DIAGNOSIS — E1159 Type 2 diabetes mellitus with other circulatory complications: Secondary | ICD-10-CM

## 2021-08-31 MED ORDER — OZEMPIC (2 MG/DOSE) 8 MG/3ML ~~LOC~~ SOPN: 2.0000 mg | PEN_INJECTOR | SUBCUTANEOUS | 3 refills | Status: DC

## 2021-08-31 MED ORDER — OZEMPIC (1 MG/DOSE) 4 MG/3ML ~~LOC~~ SOPN: 2.0000 mg | PEN_INJECTOR | SUBCUTANEOUS | 3 refills | Status: DC

## 2021-08-31 MED ORDER — NALTREXONE HCL 50 MG PO TABS: 50.0000 mg | ORAL_TABLET | Freq: Every day | ORAL | 2 refills | Status: DC

## 2021-08-31 NOTE — Telephone Encounter (Signed)
Updated dose sent to pharmacy.  Terisa Starr, MD  Family Medicine Teaching Service

## 2021-08-31 NOTE — Telephone Encounter (Signed)
Pharmacy calls nurse line in regards to Ozmepic prescription.   A 2mg  pen needs to be sent in for 2mg  injection.   Will forward to PCP.

## 2021-09-01 NOTE — Telephone Encounter (Signed)
Form placed up front for pick up.   A cop was made for batch scanning.   Patient aware.

## 2021-09-02 ENCOUNTER — Telehealth: Payer: Self-pay | Admitting: Family Medicine

## 2021-09-02 NOTE — Telephone Encounter (Signed)
Patient dropped off form at front desk for Disability   Verified that patient section of form has been completed.  Last DOS/WCC with PCP was 08/28/21.  Placed form in red team folder to be completed by clinical staff.  Amber [name]

## 2021-09-03 NOTE — Telephone Encounter (Signed)
Reviewed, completed, and signed form.  Note routed to RN team inbasket and placed completed form in RN Wall pocket in the front office.  Izreal Kock M Savanah Bayles, MD  

## 2021-09-03 NOTE — Telephone Encounter (Signed)
Reviewed form and placed in PCP's box for completion.  .Demitrius Crass R Branae Crail, CMA  

## 2021-09-04 NOTE — Telephone Encounter (Signed)
Patient called and informed that forms are ready for pick up. Copy made and placed in batch scanning. Original placed at front desk for pick up.   Kirah Stice C Shigeru Lampert, RN  

## 2021-09-06 ENCOUNTER — Encounter: Payer: Self-pay | Admitting: Family Medicine

## 2021-09-07 ENCOUNTER — Other Ambulatory Visit: Payer: Self-pay | Admitting: Family Medicine

## 2021-09-07 ENCOUNTER — Other Ambulatory Visit: Payer: Self-pay | Admitting: *Deleted

## 2021-09-07 ENCOUNTER — Other Ambulatory Visit: Payer: Self-pay

## 2021-09-07 ENCOUNTER — Other Ambulatory Visit: Payer: BC Managed Care – PPO

## 2021-09-07 DIAGNOSIS — M62838 Other muscle spasm: Secondary | ICD-10-CM

## 2021-09-07 DIAGNOSIS — E1159 Type 2 diabetes mellitus with other circulatory complications: Secondary | ICD-10-CM

## 2021-09-07 MED ORDER — BASAGLAR KWIKPEN 100 UNIT/ML ~~LOC~~ SOPN: 70.0000 [IU] | PEN_INJECTOR | Freq: Every day | SUBCUTANEOUS | Status: DC

## 2021-09-08 LAB — BASIC METABOLIC PANEL
BUN/Creatinine Ratio: 15 (ref 9–23)
BUN: 12 mg/dL (ref 6–24)
CO2: 24 mmol/L (ref 20–29)
Calcium: 9.8 mg/dL (ref 8.7–10.2)
Chloride: 99 mmol/L (ref 96–106)
Creatinine, Ser: 0.79 mg/dL (ref 0.57–1.00)
Glucose: 265 mg/dL — ABNORMAL HIGH (ref 70–99)
Potassium: 4.2 mmol/L (ref 3.5–5.2)
Sodium: 138 mmol/L (ref 134–144)
eGFR: 89 mL/min/{1.73_m2} (ref >59)

## 2021-09-08 LAB — MAGNESIUM: Magnesium: 2.2 mg/dL (ref 1.6–2.3)

## 2021-09-08 LAB — CK: Total CK: 51 U/L (ref 32–182)

## 2021-09-09 ENCOUNTER — Encounter: Payer: BC Managed Care – PPO | Admitting: Psychology

## 2021-09-24 ENCOUNTER — Encounter: Payer: Self-pay | Admitting: Family Medicine

## 2021-09-24 DIAGNOSIS — E1159 Type 2 diabetes mellitus with other circulatory complications: Secondary | ICD-10-CM

## 2021-09-24 MED ORDER — BASAGLAR KWIKPEN 100 UNIT/ML ~~LOC~~ SOPN: 70.0000 [IU] | PEN_INJECTOR | Freq: Every day | SUBCUTANEOUS | 3 refills | Status: DC

## 2021-09-24 NOTE — Addendum Note (Signed)
Addended by: Veronda Prude on: 09/24/2021 08:53 AM   Modules accepted: Orders

## 2021-09-24 NOTE — Telephone Encounter (Signed)
Original rx set to "not print." Resent prescription electronically to patient's preferred pharmacy.   Veronda Prude, RN

## 2021-09-29 ENCOUNTER — Telehealth: Payer: Self-pay | Admitting: Family Medicine

## 2021-09-29 ENCOUNTER — Encounter: Payer: BC Managed Care – PPO | Admitting: Physical Medicine & Rehabilitation

## 2021-09-29 NOTE — Telephone Encounter (Signed)
Reviewed, completed, and signed form.  Note routed to RN team inbasket and placed completed form in RN Wall pocket in the front office.  Jaelon Gatley M Katriel Cutsforth, MD  

## 2021-09-29 NOTE — Telephone Encounter (Signed)
Reviewed form and placed in PCP's box for completion.  .Genova Kiner R Zailynn Brandel, CMA  

## 2021-09-29 NOTE — Telephone Encounter (Signed)
Patient dropped off short term disability form to be completed. Last DOS was 08/28/21. Placed in [employer].

## 2021-09-30 ENCOUNTER — Encounter: Payer: BC Managed Care – PPO | Attending: Physical Medicine & Rehabilitation | Admitting: Psychology

## 2021-09-30 DIAGNOSIS — I639 Cerebral infarction, unspecified: Secondary | ICD-10-CM | POA: Insufficient documentation

## 2021-09-30 DIAGNOSIS — F331 Major depressive disorder, recurrent, moderate: Secondary | ICD-10-CM | POA: Insufficient documentation

## 2021-09-30 DIAGNOSIS — F82 Specific developmental disorder of motor function: Secondary | ICD-10-CM | POA: Insufficient documentation

## 2021-09-30 DIAGNOSIS — F909 Attention-deficit hyperactivity disorder, unspecified type: Secondary | ICD-10-CM | POA: Insufficient documentation

## 2021-10-01 NOTE — Telephone Encounter (Signed)
Faxed to [phone] and a copy left up front for patient to pick up.   Copy made for batch scanning.

## 2021-10-02 ENCOUNTER — Encounter: Payer: Self-pay | Admitting: Family Medicine

## 2021-10-08 ENCOUNTER — Telehealth: Payer: Self-pay | Admitting: Family Medicine

## 2021-10-08 NOTE — Telephone Encounter (Signed)
Spoke with patient. Daughter has concerns about cognition while out of home and away in [place]. Mother was confused, did not know which direction to go in bathroom. Niece will be moving in with her soon Engineer, maintenance moving in with [place]). Will do MOCA at visit and plan for geriatrics clinic referral.  Terisa Starr, MD  [facility] Medicine Teaching Service

## 2021-10-09 ENCOUNTER — Other Ambulatory Visit: Payer: Self-pay

## 2021-10-09 ENCOUNTER — Encounter: Payer: Self-pay | Admitting: Family Medicine

## 2021-10-09 ENCOUNTER — Ambulatory Visit: Payer: BC Managed Care – PPO | Admitting: Family Medicine

## 2021-10-09 VITALS — BP 135/71 | HR 98 | Wt 278.8 lb

## 2021-10-09 DIAGNOSIS — W19XXXA Unspecified fall, initial encounter: Secondary | ICD-10-CM | POA: Diagnosis not present

## 2021-10-09 DIAGNOSIS — E1159 Type 2 diabetes mellitus with other circulatory complications: Secondary | ICD-10-CM

## 2021-10-09 DIAGNOSIS — Z794 Long term (current) use of insulin: Secondary | ICD-10-CM

## 2021-10-09 NOTE — Progress Notes (Signed)
    SUBJECTIVE:   CHIEF COMPLAINT: blood glucose HPI:   Amber [name] is a 51 y.o.  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 Desires weight loss. Also on Ozempic at maximal dose. Discussed CGM (has had negative experience)   Reports 2 weeks ago was rushing down hall and tripped on rug. Was not using cane. Did not hit head or have LOC. Did hit right shoulder which has been painful since that time. Symptoms are improving as has bruising. Taking nothing for this at this time.  She has also had some difficulty driving recently, feels like her reaction time and ability to focus is not as good.   She is taking several supplements, including a new one hesperidin. She is switching from Sertraline to Cymbalta.   PERTINENT  PMH / PSH/Family/Social History : updated   OBJECTIVE:   BP 135/71   Pulse 98   Wt 278 lb 12.8 oz (126.5 kg)   SpO2 99%   BMI 43.67 kg/m   Today's weight:  Last Weight  Most recent update: 10/09/2021 11:12 AM    Weight  126.5 kg (278 lb 12.8 oz)            Review of prior weights: [employer]   10/09/21 1109  Weight: 278 lb 12.8 oz (126.5 kg)     Cardiac: Regular rate and rhythm. Normal S1/S2. No murmurs, rubs, or gallops appreciated. Lungs: Clear bilaterally to ascultation.  Psych: Pleasant and appropriate  TTP along R AC joint  Shoulder Abduction (C5) Intact + Painful arc on R Elbow Flexion (C6) intact Shoulder Extension above head (C7) intact Forearm Pronation - C7/8 intact Wrist Extension (C6) intact Wrist Flexion (C7) intact Fingers Extension/ Flexion (C7, C8) intact Finger Abduction/adduction (T1) intact   ASSESSMENT/PLAN:   DM2 (diabetes mellitus, type 2)  (HCC) Not at goal, discussed importance of improved blood glucose to reduce microvascular complications. Discussed insulin regimen. Lantus increased to 40/40 units. She is to bring log to follow up. Discussed seeing Dr. Raymondo Band, CGM, will follow up in 1 month. Has had negative experience with CGM in past. Consider discussion of NDES at followup.    Fall at home - Discussed home safety - Shoulder x-ray  - reviewed cane use - Handicap placard filled out for history of CVA   HCM Number given to schedule with GI.      Terisa Starr, MD  Family Medicine Teaching Service  [facility]

## 2021-10-09 NOTE — Assessment & Plan Note (Signed)
Not at goal, discussed importance of improved blood glucose to reduce microvascular complications. Discussed insulin regimen. Lantus increased to 40/40 units. She is to bring log to follow up. Discussed seeing Dr. Raymondo Band, CGM, will follow up in 1 month. Has had negative experience with CGM in past. Consider discussion of NDES at followup.

## 2021-10-09 NOTE — Patient Instructions (Addendum)
It was wonderful to see you today.  Please bring ALL of your medications with you to every visit.   Today we talked about: Call gastroenterology [phone]  Please keep a log of your sugars and insulin dose  Increase your insulin to 40 units in the AM and 40 in the PM  Follow up in 1 month for your sugars    For your shoulder   An x-ray was ordered for you---you do not need an appointment to have this completed.  I recommend going to [facility] [address] [place] [address] [place]   If the results are normal,I will send you a letter  I will call you with results if anything is abnormal      Please follow up in 1 months   Thank you for choosing [facility] Medicine.   Please call [phone] with any questions about today's appointment.  Please be sure to schedule follow up at the front  desk before you leave today.   Terisa Starr, MD  Family Medicine

## 2021-10-27 ENCOUNTER — Encounter: Payer: BC Managed Care – PPO | Admitting: Psychology

## 2021-11-02 ENCOUNTER — Ambulatory Visit
Admission: RE | Admit: 2021-11-02 | Discharge: 2021-11-02 | Disposition: A | Discharge status: Home / Self Care | Payer: BC Managed Care – PPO | Source: Ambulatory Visit | Attending: Family Medicine | Admitting: Family Medicine

## 2021-11-02 ENCOUNTER — Ambulatory Visit (INDEPENDENT_AMBULATORY_CARE_PROVIDER_SITE_OTHER): Payer: BC Managed Care – PPO | Admitting: Family Medicine

## 2021-11-02 ENCOUNTER — Encounter: Payer: Self-pay | Admitting: Family Medicine

## 2021-11-02 ENCOUNTER — Telehealth: Payer: Self-pay | Admitting: Family Medicine

## 2021-11-02 VITALS — BP 136/69 | HR 100 | Wt 287.4 lb

## 2021-11-02 DIAGNOSIS — R053 Chronic cough: Secondary | ICD-10-CM | POA: Diagnosis not present

## 2021-11-02 DIAGNOSIS — E1159 Type 2 diabetes mellitus with other circulatory complications: Secondary | ICD-10-CM

## 2021-11-02 DIAGNOSIS — W19XXXA Unspecified fall, initial encounter: Secondary | ICD-10-CM

## 2021-11-02 MED ORDER — ACCU-CHEK SOFTCLIX LANCETS MISC: 12 refills | Status: AC

## 2021-11-02 MED ORDER — FLUTICASONE PROPIONATE 50 MCG/ACT NA SUSP: 2.0000 | Freq: Every day | NASAL | 6 refills | Status: DC

## 2021-11-02 MED ORDER — BD PEN NEEDLE ORIGINAL U/F 29G X 12.7MM MISC: 12 refills | Status: DC

## 2021-11-02 MED ORDER — RELION BLOOD GLUCOSE TEST VI STRP: ORAL_STRIP | 12 refills | Status: AC

## 2021-11-02 NOTE — Patient Instructions (Signed)
It was wonderful to see you today.  Please bring ALL of your medications with you to every visit.   Today we talked about:   We may need to switch your Lisinopril-HCTZ combo to another agent (it would be another combination pill)  Call Gastroenterology today!!!!   Follow up in 1 month--I sent in all the diabetes supplies--let me know what else you need   Start Flonase  An x-ray was ordered for you---you do not need an appointment to have this completed.  I recommend going to [facility] [address] [place] [address] [place]     Please follow up in 1 month for diabetes   Thank you for choosing [facility].   Please call [phone] with any questions about today's appointment.  Please be sure to schedule follow up at the front  desk before you leave today.   Terisa Starr, MD  Family Medicine

## 2021-11-02 NOTE — Progress Notes (Signed)
    SUBJECTIVE:   CHIEF COMPLAINT: diabetes check HPI:   Amber [name] is a 51 y.o.  with history notable for CVA with residual weakness and cognitive changes, type 2 DM, HTN presenting for follow up on diabetes.  Diabetes Patient has been out of testing supplies and unsure of values--last check was in 300s. Reports adherence to GLP1 and her insulin. No hypoglycemia. Does occasionally have urinary incontinence that is bothersome but no significant polyuria or polydipsia.   Shoulder   Had right shoulder pain after fall last month. X-ray was not yet obtained. Reports should still slightly bothersome. No weakness or numbness. No new injuries   Cough Reports months of a bothersome cough. She feels congested with mild productive side to the cough. She is using her PPI. Has not tried flonase or other nasal spray. Does take ACE-combination pill. She is a non-smoker. No dyspnea or chest pain or fevers.   Other Updates No falls since last visit. Feels her stamina is very low--she is determined to get back to [employer] and back on a schedule. She has a hard time focusing, for example, forgot to get x-ray after last visit. Going with cousin, mom, and Gregary Signs to [place] end of the [place] is excited.   PERTINENT  PMH / PSH/Family/Social History : type 2 DM, CVA, HTN   OBJECTIVE:   BP 136/69   Pulse 100   Wt 287 lb 6.4 oz (130.4 kg)   SpO2 98%   BMI 45.01 kg/m   Today's weight:  Last Weight  Most recent update: 11/02/2021 11:06 AM    Weight  130.4 kg (287 lb 6.4 oz)            Review of prior weights: [employer]   11/02/21 1104  Weight: 287 lb 6.4 oz (130.4 kg)    HEENT: Mild post nasal drainage Relatively unremarkable exam No LAD  Cardiac: Regular rate and rhythm. Normal S1/S2. No murmurs, rubs, or gallops appreciated. Lungs: Clear bilaterally to ascultation.  Psych: Pleasant and appropriate    ASSESSMENT/PLAN:   DM2 (diabetes mellitus, type 2) (HCC) New  supplies to pharmacy She will keep a log for next visit  Continue current medications---may be able to trial SGLT2 inhibitor if BG are in better range (has urinary symptoms which may preclude this)    Morbid obesity (HCC) Discussed--she is setting a schedule for the gym--congratulated on planning stages of change    Chronic Cough Possibly due to post nasal drip No history of asthma Already on PPI Given age will obtain CXR, likely need to transition to ARB-HCTZ combination   HCM She is calling GI to schedule colonoscopy today   R shoulder pain X-ray today, continue ROM exercises   CMP at follow up for naltrexone     Terisa Starr, MD  Family Medicine Teaching Service  [facility]

## 2021-11-02 NOTE — Telephone Encounter (Signed)
Reviewed, completed, and signed form.  Note routed to RN team inbasket and placed completed form in RN Wall pocket in the front office.  Deven Furia M Samyukta Cura, MD  

## 2021-11-02 NOTE — Telephone Encounter (Signed)
Faxed to [phone].  Copy made and placed in batch scanning.   Veronda Prude, RN

## 2021-11-02 NOTE — Assessment & Plan Note (Signed)
New supplies to pharmacy She will keep a log for next visit  Continue current medications---may be able to trial SGLT2 inhibitor if BG are in better range (has urinary symptoms which may preclude this)

## 2021-11-02 NOTE — Assessment & Plan Note (Signed)
Discussed--she is setting a schedule for the gym--congratulated on planning stages of change

## 2021-11-03 ENCOUNTER — Encounter: Payer: Self-pay | Admitting: Family Medicine

## 2021-11-04 MED ORDER — MELOXICAM 7.5 MG PO TABS: 7.5000 mg | ORAL_TABLET | Freq: Every day | ORAL | 0 refills | Status: DC

## 2021-11-11 ENCOUNTER — Encounter: Payer: Self-pay | Admitting: Family Medicine

## 2021-11-11 DIAGNOSIS — I10 Essential (primary) hypertension: Secondary | ICD-10-CM

## 2021-11-11 MED ORDER — VALSARTAN-HYDROCHLOROTHIAZIDE 160-25 MG PO TABS: 1.0000 | ORAL_TABLET | Freq: Every day | ORAL | 3 refills | Status: DC

## 2021-11-29 ENCOUNTER — Encounter: Payer: Self-pay | Admitting: Family Medicine

## 2021-12-01 ENCOUNTER — Telehealth: Payer: Self-pay | Admitting: Family Medicine

## 2021-12-01 NOTE — Telephone Encounter (Signed)
Reviewed form and placed in PCP's box for completion.  .Kilyn Maragh R Heliodoro Domagalski, CMA  

## 2021-12-01 NOTE — Telephone Encounter (Signed)
Patient dropped off short term disability paperwork to be completed. Last DOS was 11/02/21. Placed in [employer].

## 2021-12-02 NOTE — Telephone Encounter (Signed)
Reviewed, completed, and signed form.  Note routed to RN team inbasket and placed completed form in RN Wall pocket in the front office.  Tacoya Altizer M Jerilyn Gillaspie, MD  

## 2021-12-03 NOTE — Telephone Encounter (Signed)
Forms faxed to [phone].   Copy made and placed in batch scanning.   Veronda Prude, RN

## 2021-12-16 ENCOUNTER — Encounter: Payer: BC Managed Care – PPO | Attending: Physical Medicine & Rehabilitation | Admitting: Psychology

## 2021-12-16 DIAGNOSIS — F331 Major depressive disorder, recurrent, moderate: Secondary | ICD-10-CM | POA: Insufficient documentation

## 2021-12-16 DIAGNOSIS — I639 Cerebral infarction, unspecified: Secondary | ICD-10-CM | POA: Insufficient documentation

## 2021-12-16 DIAGNOSIS — F909 Attention-deficit hyperactivity disorder, unspecified type: Secondary | ICD-10-CM | POA: Insufficient documentation

## 2021-12-16 DIAGNOSIS — F82 Specific developmental disorder of motor function: Secondary | ICD-10-CM | POA: Insufficient documentation

## 2021-12-17 ENCOUNTER — Ambulatory Visit (INDEPENDENT_AMBULATORY_CARE_PROVIDER_SITE_OTHER): Payer: BC Managed Care – PPO

## 2021-12-17 DIAGNOSIS — Z23 Encounter for immunization: Secondary | ICD-10-CM | POA: Diagnosis not present

## 2021-12-23 ENCOUNTER — Encounter: Payer: Self-pay | Admitting: Family Medicine

## 2021-12-23 ENCOUNTER — Other Ambulatory Visit: Payer: Self-pay | Admitting: *Deleted

## 2021-12-25 ENCOUNTER — Encounter: Payer: Self-pay | Admitting: Family Medicine

## 2021-12-25 ENCOUNTER — Ambulatory Visit: Payer: BC Managed Care – PPO | Admitting: Family Medicine

## 2021-12-25 ENCOUNTER — Telehealth: Payer: Self-pay | Admitting: Family Medicine

## 2021-12-25 VITALS — BP 107/70 | HR 91 | Wt 278.0 lb

## 2021-12-25 DIAGNOSIS — E1159 Type 2 diabetes mellitus with other circulatory complications: Secondary | ICD-10-CM | POA: Diagnosis not present

## 2021-12-25 DIAGNOSIS — Z8673 Personal history of transient ischemic attack (TIA), and cerebral infarction without residual deficits: Secondary | ICD-10-CM | POA: Diagnosis not present

## 2021-12-25 DIAGNOSIS — I10 Essential (primary) hypertension: Secondary | ICD-10-CM | POA: Diagnosis not present

## 2021-12-25 LAB — POCT GLYCOSYLATED HEMOGLOBIN (HGB A1C): HbA1c, POC (controlled diabetic range): 9.9 % — AB (ref 0.0–7.0)

## 2021-12-25 MED ORDER — BASAGLAR KWIKPEN 100 UNIT/ML ~~LOC~~ SOPN: 45.0000 [IU] | PEN_INJECTOR | Freq: Two times a day (BID) | SUBCUTANEOUS | 3 refills | Status: DC

## 2021-12-25 NOTE — Patient Instructions (Addendum)
It was wonderful to see you today.  Please bring ALL of your medications with you to every visit.   Today we talked about:  -Going to get blood work  - I will ask Traci about CAPS   - Please increase your insulin to 45 units each injection Call me if your sugars are <120   Follow up in 1-2 months   [place]  Thank you for choosing [place].   Please call [phone] with any questions about today's appointment.  Please be sure to schedule follow up at the front  desk before you leave today.   Dorris Singh, MD  Family Medicine

## 2021-12-25 NOTE — Assessment & Plan Note (Signed)
A1C above goal, BG improving, now checking BG Range 170-180 Increased insulin Will refer to Nutrition

## 2021-12-25 NOTE — Telephone Encounter (Signed)
Reviewed, completed, and signed form.  Note routed to RN team inbasket and placed completed form in RN Wall pocket in the front office.  Atalaya Zappia M Bastien Strawser, MD  

## 2021-12-25 NOTE — Progress Notes (Signed)
    SUBJECTIVE:   CHIEF COMPLAINT: diabetes check HPI:   Amber [name] is a 51 y.o.  with history notable for type 2 DM, HTN, and cerebrovascular disease presenting for follow up for diabetes.  Diabetes Taking insulin 40U/40U. No lows but sugars have greatly improved to 170-180. Taking Ozempic without issue. Her weight is down from last visit. She is working on dietary changes, would consider seeing nutritionist.    HTN Cough has resolved with Losartan (now off ACE). No headaches or chest pain.   History of stroke Recently saw her Psychiatrist who increased Cymbalta. Feels her mood is better but focus is challenging. Develops extreme fatigue after ~15 minutes of doing a task. Has to sit down and rest if she focuses for more than this time. Requires assistance with cooking and getting dressed due to residual deficits. She is able to drive but uses Google maps even to get to familiar places and frequently misses turns.   PERTINENT  PMH / PSH/Family/Social History : updated and reviewed  OBJECTIVE:   BP 107/70   Pulse 91   Wt 278 lb (126.1 kg)   SpO2 97%   BMI 43.54 kg/m   Today's weight:  Last Weight  Most recent update: 12/25/2021  9:42 AM    Weight  126.1 kg (278 lb)            Review of prior weights: Filed Weights   12/25/21 0941  Weight: 278 lb (126.1 kg)     Cardiac: Regular rate and rhythm. Normal S1/S2. No murmurs, rubs, or gallops appreciated. Lungs: Clear bilaterally to ascultation.  Neuro: Speech is appropriate.  RUE fine motor weakness  Uses cane--RLE with reduced strength as compared to L  Psych: Pleasant and appropriate    ASSESSMENT/PLAN:   HTN (hypertension) At goal, doing well on ARB. Cough has resolved. BMP today.   DM2 (diabetes mellitus, type 2) (HCC) A1C above goal, BG improving, now checking BG Range 170-180 Increased insulin Will refer to Nutrition   History of CVA (cerebrovascular accident) Paperwork received, will complete      HCM Number for GI given Has already received flu shot  UACR at follow up--if BG in improved range, consider SGLT2 inhibitor     Dorris Singh, MD  [place]

## 2021-12-25 NOTE — Assessment & Plan Note (Signed)
At goal, doing well on ARB. Cough has resolved. BMP today.

## 2021-12-25 NOTE — Assessment & Plan Note (Signed)
Paperwork received, will complete.

## 2021-12-26 LAB — COMPREHENSIVE METABOLIC PANEL
ALT: 9 IU/L (ref 0–32)
AST: 6 IU/L (ref 0–40)
Albumin/Globulin Ratio: 1.8 (ref 1.2–2.2)
Albumin: 4.2 g/dL (ref 3.8–4.9)
Alkaline Phosphatase: 108 IU/L (ref 44–121)
BUN/Creatinine Ratio: 10 (ref 9–23)
BUN: 6 mg/dL (ref 6–24)
Bilirubin Total: 0.4 mg/dL (ref 0.0–1.2)
CO2: 20 mmol/L (ref 20–29)
Calcium: 9.4 mg/dL (ref 8.7–10.2)
Chloride: 100 mmol/L (ref 96–106)
Creatinine, Ser: 0.62 mg/dL (ref 0.57–1.00)
Globulin, Total: 2.4 g/dL (ref 1.5–4.5)
Glucose: 261 mg/dL — ABNORMAL HIGH (ref 70–99)
Potassium: 4 mmol/L (ref 3.5–5.2)
Sodium: 138 mmol/L (ref 134–144)
Total Protein: 6.6 g/dL (ref 6.0–8.5)
eGFR: 106 mL/min/{1.73_m2} (ref >59)

## 2021-12-28 ENCOUNTER — Encounter: Payer: Self-pay | Admitting: Family Medicine

## 2021-12-28 NOTE — Telephone Encounter (Signed)
Form faxed to [phone]. ROI on file.   Copy made and placed in batch scanning,   Talbot Grumbling, RN

## 2021-12-29 ENCOUNTER — Telehealth: Payer: Self-pay | Admitting: Family Medicine

## 2021-12-29 NOTE — Telephone Encounter (Signed)
Reviewed, completed, and signed form.  Note routed to RN team inbasket and placed completed form in RN Wall pocket in the front office.Please attach all of my office notes, Neurology office notes, and imaging (likely needs to go to medical records)  Martyn Malay, MD

## 2022-01-01 ENCOUNTER — Encounter: Payer: Self-pay | Admitting: Adult Health

## 2022-01-01 NOTE — Telephone Encounter (Signed)
Mychart message sent to patient. Jerene Pitch is processing medical records and will place at front desk once completed.   Talbot Grumbling, RN

## 2022-01-11 ENCOUNTER — Telehealth: Payer: Self-pay

## 2022-01-11 ENCOUNTER — Encounter: Payer: Self-pay | Admitting: Family Medicine

## 2022-01-11 MED ORDER — NYSTATIN-TRIAMCINOLONE 100000-0.1 UNIT/GM-% EX OINT: 1.0000 | TOPICAL_OINTMENT | Freq: Two times a day (BID) | CUTANEOUS | 2 refills | Status: DC

## 2022-01-11 NOTE — Telephone Encounter (Signed)
Rx to pharmacy. Will let patient know via MyChart.   Dorris Singh, MD  Family Medicine Teaching Service

## 2022-01-12 ENCOUNTER — Encounter: Payer: Self-pay | Admitting: Family Medicine

## 2022-01-21 ENCOUNTER — Encounter: Payer: Self-pay | Admitting: Family Medicine

## 2022-01-21 DIAGNOSIS — E1159 Type 2 diabetes mellitus with other circulatory complications: Secondary | ICD-10-CM

## 2022-01-21 MED ORDER — NOVOLOG FLEXPEN 100 UNIT/ML ~~LOC~~ SOPN: 40.0000 [IU] | PEN_INJECTOR | Freq: Two times a day (BID) | SUBCUTANEOUS | 11 refills | Status: DC

## 2022-01-26 ENCOUNTER — Ambulatory Visit (INDEPENDENT_AMBULATORY_CARE_PROVIDER_SITE_OTHER): Payer: BC Managed Care – PPO

## 2022-01-26 ENCOUNTER — Telehealth: Payer: Self-pay | Admitting: Family Medicine

## 2022-01-26 DIAGNOSIS — Z23 Encounter for immunization: Secondary | ICD-10-CM

## 2022-01-26 NOTE — Telephone Encounter (Signed)
Reviewed, completed, and signed form.  Note routed to RN team inbasket and placed completed form in RN Wall pocket in the front office.  Fruma Africa M Gustabo Gordillo, MD  

## 2022-01-26 NOTE — Progress Notes (Signed)
   Covid-19 Vaccination Clinic  Name:  Amber [name]    MRN: [id number] DOB: 1970/05/19  01/26/2022  Patient presents to nurse clinic for [place] vaccination. Administered in LD, site unremarkable, tolerated injection well.   [name] was observed post Covid-19 immunization for 15 minutes without incident. She was provided with Vaccine Information Sheet and instruction to access the V-Safe system.   [name] was instructed to call 911 with any severe reactions post vaccine: Difficulty breathing  Swelling of face and throat  A fast heartbeat  A bad rash all over body  Dizziness and weakness    Talbot Grumbling, RN

## 2022-01-26 NOTE — Telephone Encounter (Signed)
Patient dropped of disability forms to be completed and faxed to company handling it.  Last visit was on 12/25/21.  Pcp is Dr Owens Shark.  Put information in red folder also needs to be faxed any questions call patient

## 2022-01-26 NOTE — Telephone Encounter (Signed)
Placed in MDs box to be filed out. Abby Stines Kennon Holter, CMA

## 2022-01-27 ENCOUNTER — Encounter: Payer: Self-pay | Admitting: Family Medicine

## 2022-01-27 DIAGNOSIS — E1159 Type 2 diabetes mellitus with other circulatory complications: Secondary | ICD-10-CM

## 2022-01-27 MED ORDER — NOVOLOG FLEXPEN 100 UNIT/ML ~~LOC~~ SOPN: 40.0000 [IU] | PEN_INJECTOR | Freq: Two times a day (BID) | SUBCUTANEOUS | 11 refills | Status: DC

## 2022-01-27 MED ORDER — NYSTATIN-TRIAMCINOLONE 100000-0.1 UNIT/GM-% EX OINT: 1.0000 | TOPICAL_OINTMENT | Freq: Two times a day (BID) | CUTANEOUS | 2 refills | Status: AC

## 2022-01-28 NOTE — Telephone Encounter (Signed)
Forms faxed to number provided.   Copy made for scanning.

## 2022-02-12 ENCOUNTER — Encounter: Payer: Self-pay | Admitting: Family Medicine

## 2022-02-16 ENCOUNTER — Ambulatory Visit: Payer: BC Managed Care – PPO | Admitting: Dietician

## 2022-02-22 ENCOUNTER — Encounter: Payer: Self-pay | Admitting: Family Medicine

## 2022-02-22 ENCOUNTER — Telehealth: Payer: Self-pay | Admitting: Family Medicine

## 2022-02-22 NOTE — Telephone Encounter (Signed)
Patient came in dropped off form for short term disability form her pcp to completed.  She wants it faxed.  Put all information in red folder.

## 2022-02-22 NOTE — Telephone Encounter (Signed)
Placed in MDs box to be filled out. Leeloo Silverthorne, CMA  

## 2022-02-23 NOTE — Telephone Encounter (Signed)
Reviewed, completed, and signed form.  Note routed to RN team inbasket and placed completed form in RN Wall pocket in the front office.  Shadeed Colberg M Lorne Winkels, MD  

## 2022-02-25 ENCOUNTER — Encounter: Payer: Self-pay | Admitting: Family Medicine

## 2022-02-25 MED ORDER — INSULIN GLARGINE 100 UNIT/ML SOLOSTAR PEN: 45.0000 [IU] | PEN_INJECTOR | Freq: Two times a day (BID) | SUBCUTANEOUS | 11 refills | Status: DC

## 2022-02-25 NOTE — Telephone Encounter (Signed)
Unable to find form in nurse box. Forms were found in previously faxed files from 12/5.  Veronda Prude, RN

## 2022-02-26 ENCOUNTER — Encounter: Payer: BC Managed Care – PPO | Admitting: Dietician

## 2022-02-26 ENCOUNTER — Encounter: Payer: Self-pay | Admitting: Family Medicine

## 2022-02-26 LAB — HM DIABETES EYE EXAM

## 2022-03-01 ENCOUNTER — Other Ambulatory Visit: Payer: Self-pay

## 2022-03-01 ENCOUNTER — Ambulatory Visit (INDEPENDENT_AMBULATORY_CARE_PROVIDER_SITE_OTHER): Payer: BC Managed Care – PPO | Admitting: Family Medicine

## 2022-03-01 ENCOUNTER — Ambulatory Visit: Payer: BC Managed Care – PPO | Admitting: Dietician

## 2022-03-01 ENCOUNTER — Encounter: Payer: Self-pay | Admitting: Family Medicine

## 2022-03-01 VITALS — BP 143/82 | HR 91 | Wt 284.4 lb

## 2022-03-01 DIAGNOSIS — Z1239 Encounter for other screening for malignant neoplasm of breast: Secondary | ICD-10-CM

## 2022-03-01 DIAGNOSIS — Z91014 Allergy to mammalian meats: Secondary | ICD-10-CM

## 2022-03-01 DIAGNOSIS — E1159 Type 2 diabetes mellitus with other circulatory complications: Secondary | ICD-10-CM | POA: Diagnosis not present

## 2022-03-01 DIAGNOSIS — E785 Hyperlipidemia, unspecified: Secondary | ICD-10-CM | POA: Diagnosis not present

## 2022-03-01 DIAGNOSIS — F331 Major depressive disorder, recurrent, moderate: Secondary | ICD-10-CM

## 2022-03-01 MED ORDER — ATORVASTATIN CALCIUM 80 MG PO TABS: 80.0000 mg | ORAL_TABLET | Freq: Every day | ORAL | 3 refills | Status: DC

## 2022-03-01 MED ORDER — EPINEPHRINE 0.3 MG/0.3ML IJ SOAJ: 0.3000 mg | INTRAMUSCULAR | 1 refills | Status: DC | PRN

## 2022-03-01 MED ORDER — BASAGLAR KWIKPEN 100 UNIT/ML ~~LOC~~ SOPN: 45.0000 [IU] | PEN_INJECTOR | Freq: Two times a day (BID) | SUBCUTANEOUS | 3 refills | Status: DC

## 2022-03-01 MED ORDER — INSULIN GLARGINE 100 UNIT/ML SOLOSTAR PEN: 45.0000 [IU] | PEN_INJECTOR | Freq: Two times a day (BID) | SUBCUTANEOUS | 3 refills | Status: DC

## 2022-03-01 NOTE — Progress Notes (Signed)
    SUBJECTIVE:   CHIEF COMPLAINT: check medications  HPI:   Amber [name] is a 51 y.o.  with history notable for CVA, type 2 DM, HLD and HTN  presenting for follow up.  The patient has had improvement in her BG levels. Ranging from 110-150. No hypoglycemia. She does reports some values of >250 when she is out of insulin. Her pharmacy is closed on weekends and she runs out of insulin frequently. She continues to take Ozempic. She is determined to get off insulin. Has tried dietary changes and is interested in swimming program.  The patient has a history of PTSD, exacerbated by her stroke. She is interested in medical cannabis use. She wonders about the effects of blood pressure.  The patient did not yet take her AM blood pressure pills. No HA, vision changes, chest pain.  The patient recently returned from [place] and is planning more family trips to the [place] of [place]. Overall, doing well. Continues to have difficulty preparing meals at home, driving unfamiliar places, and focusing on tasks. Dexterity of the R hand has not improved. She can cook/prep meals for 10-15 minutes then cannot continue without resting.   PERTINENT  PMH / PSH/Family/Social History : Updated and reviewed   OBJECTIVE:   BP (!) 143/82   Pulse 91   Wt 284 lb 6.4 oz (129 kg)   SpO2 99%   BMI 44.54 kg/m   Today's weight:  Last Weight  Most recent update: 03/01/2022  9:06 AM    Weight  129 kg (284 lb 6.4 oz)            Review of prior weights: Filed Weights   03/01/22 0905  Weight: 284 lb 6.4 oz (129 kg)     Cardiac: Regular rate and rhythm. Normal S1/S2. No murmurs, rubs, or gallops appreciated. Lungs: Clear bilaterally to ascultation.  Abdomen: Normoactive bowel sounds. No tenderness to deep or light palpation. No rebound or guarding.   Psych: Pleasant and appropriate    ASSESSMENT/PLAN:   DM2 (diabetes mellitus, type 2) (HCC) Refilled insulin for 90 day supply and sent to 24 hour pharmacy.   [employer] Rx Discussed and encouraged exercise--she is working on this Continue semaglutide Refilled statin Allergic to metformin Could consider SGLT2 pending next A1C   Depression History of PTSD and Anxiety Doing well on Duloxetine Discussed that Psychiatrist should Rx medical cannabis if appropriate for these conditions  UACR today    HCM COVID booster given  Mammogram ordered and discussed    Terisa Starr, MD  Family Medicine Teaching Service  [facility]

## 2022-03-01 NOTE — Assessment & Plan Note (Addendum)
Refilled insulin for 90 day supply and sent to 24 hour pharmacy.  [employer] Rx Discussed and encouraged exercise--she is working on this Continue semaglutide Refilled statin Allergic to metformin Could consider SGLT2 pending next A1C

## 2022-03-01 NOTE — Patient Instructions (Signed)
It was wonderful to see you today.  Please bring ALL of your medications with you to every visit.   Today we talked about:  - You are doing great work with your health!!!    - I recommend talking with your psychiatrist about cannabis  - You should be called or messaged about [employer] about the pool    I recommend you undergo a mammogram.   You can call to schedule an appointment by calling [phone].   Directions [address] [place], [place] [address]  Please let me know if you have questions. I will send you a letter or call you with results.    Please follow up in 2 months for an A1C   Thank you for choosing [facility] Medicine.   Please call [phone] with any questions about today's appointment.  Please be sure to schedule follow up at the front  desk before you leave today.   Terisa Starr, MD  Family Medicine

## 2022-03-01 NOTE — Assessment & Plan Note (Addendum)
History of PTSD and Anxiety Doing well on Duloxetine Discussed that Psychiatrist should Rx medical cannabis if appropriate for these conditions  UACR today

## 2022-03-02 ENCOUNTER — Ambulatory Visit
Admission: RE | Admit: 2022-03-02 | Discharge: 2022-03-02 | Disposition: A | Discharge status: Home / Self Care | Payer: BC Managed Care – PPO | Source: Ambulatory Visit | Attending: Family Medicine | Admitting: Family Medicine

## 2022-03-02 DIAGNOSIS — Z1239 Encounter for other screening for malignant neoplasm of breast: Secondary | ICD-10-CM

## 2022-03-02 LAB — MICROALBUMIN / CREATININE URINE RATIO
Creatinine, Urine: 119.1 mg/dL
Microalb/Creat Ratio: 72 mg/g creat — ABNORMAL HIGH (ref 0–29)
Microalbumin, Urine: 85.2 ug/mL

## 2022-03-02 MED ORDER — TRESIBA FLEXTOUCH 100 UNIT/ML ~~LOC~~ SOPN: 45.0000 [IU] | PEN_INJECTOR | Freq: Two times a day (BID) | SUBCUTANEOUS | 3 refills | Status: DC

## 2022-03-02 NOTE — Addendum Note (Signed)
Addended by: Manson Passey, Arthelia Callicott on: 03/02/2022 12:04 PM   Modules accepted: Orders

## 2022-03-08 ENCOUNTER — Encounter: Payer: Self-pay | Admitting: Family Medicine

## 2022-03-20 ENCOUNTER — Encounter: Payer: Self-pay | Admitting: Family Medicine

## 2022-03-20 DIAGNOSIS — E1159 Type 2 diabetes mellitus with other circulatory complications: Secondary | ICD-10-CM

## 2022-03-24 ENCOUNTER — Other Ambulatory Visit: Payer: Self-pay

## 2022-03-24 DIAGNOSIS — E1159 Type 2 diabetes mellitus with other circulatory complications: Secondary | ICD-10-CM

## 2022-03-24 MED ORDER — NOVOLOG FLEXPEN 100 UNIT/ML ~~LOC~~ SOPN: 40.0000 [IU] | PEN_INJECTOR | Freq: Two times a day (BID) | SUBCUTANEOUS | 11 refills | Status: DC

## 2022-03-24 NOTE — Telephone Encounter (Signed)
Patient calls nurse line regarding Novolog prescription. She is needing this prescription transferred, as she lives in [place] now.   Called [facility]. They report that prescription was canceled and they are unable to refill. They are needing a new prescription.   Pended Novolog prescription to [facility] in [place].  Talbot Grumbling, RN

## 2022-03-26 ENCOUNTER — Encounter: Payer: BC Managed Care – PPO | Admitting: Dietician

## 2022-04-07 ENCOUNTER — Encounter: Payer: Self-pay | Admitting: Family Medicine

## 2022-04-08 NOTE — Telephone Encounter (Signed)
Sent msg to RN team in mother's chart as I believe Dr. Owens Shark already addressed this. Leeanne Rio, MD

## 2022-06-04 ENCOUNTER — Encounter: Payer: Self-pay | Admitting: Family Medicine

## 2022-06-04 DIAGNOSIS — E1159 Type 2 diabetes mellitus with other circulatory complications: Secondary | ICD-10-CM

## 2022-06-16 MED ORDER — NOVOLOG FLEXPEN 100 UNIT/ML ~~LOC~~ SOPN: 40.0000 [IU] | PEN_INJECTOR | Freq: Two times a day (BID) | SUBCUTANEOUS | 11 refills | Status: DC

## 2022-06-30 ENCOUNTER — Encounter: Payer: Self-pay | Admitting: Family Medicine

## 2022-07-16 ENCOUNTER — Encounter: Payer: Self-pay | Admitting: Family Medicine

## 2022-07-19 MED ORDER — NYSTATIN 100000 UNIT/GM EX POWD: 1.0000 | Freq: Three times a day (TID) | CUTANEOUS | 1 refills | Status: AC

## 2022-08-12 NOTE — Progress Notes (Signed)
SUBJECTIVE:   CHIEF COMPLAINT: follow up HPI:   Amber [name] is a 52 y.o.  with history notable for CVD, HTN, obesity, and stroke presenting for follow up.  The patient reports overall she is doing ok.  She is no longer engaged.  She has discovered that she is a lesbian and has a girlfriend named Amber [name] Psychologist, counselling).  She does have a significant stressor--her mother has continued to have decline in her memory and abilities.  She is living with her mother full-time.  She is taking care of her full-time.  This is hard for her as the patient is disabled.  The patient is fallen multiple times no loss of consciousness or injury to the head.  She has such has neglected her own health.  She will [facility] [place]. [name] is moving to [place] in the near future.  She is working on getting back on track with her diabetes and other conditions.  The patient reports she is out of her Trulicity as well as her Ozempic.  She is compliant with her other diabetes medications.  She reports that she has not been taking good care of herself nor she been checking her sugars recently.  She does endorse some polyuria but no polydipsia nausea vomiting or hypoglycemia episodes.  She is up-to-date on her vision exam.  The patient reports she did not take her blood pressure pills this morning as she was rushing and had gotten argue with her mom this morning.  She denies headaches chest pain or vision changes.  PERTINENT  PMH / PSH/Family/Social History :  CVA  Depression Type 2 DM HTN Obesity  OBJECTIVE:   BP (!) 155/82   Pulse 72   Wt 288 lb 12.8 oz (131 kg)   LMP 04/19/2021 (Approximate)   SpO2 98%   BMI 45.23 kg/m   Today's weight:  Last Weight  Most recent update: 08/13/2022 11:04 AM    Weight  131 kg (288 lb 12.8 oz)            Review of prior weights: Filed Weights   08/13/22 1102  Weight: 288 lb 12.8 oz (131 kg)     Cardiac: Regular rate  and rhythm. Normal S1/S2. No murmurs, rubs, or gallops appreciated. Lungs: Clear bilaterally to ascultation.  Psych: Pleasant and appropriate  No LE edema   ASSESSMENT/PLAN:   Type 2 diabetes mellitus with other circulatory complication, without long-term current use of insulin (HCC) Assessment & Plan: We discussed the importance of improved glucose control to reduce complications such as eye disease, stroke, heart attack.  We discussed the importance of lifestyle changes as well as compliance with medications.  Refilled her insulin, she has test trips at home she will start to test again.  Refilled her Ozempic as well.  She has a had success with reducing sugar in the past on naltrexone and would like to restart this.  We discussed the benefits and risks at length and agreed to restart this medication.  She is very determined to make lifestyle changes and thus was referred to the prep program.  Follow-up in 2 to 4 weeks to further discuss glucose control.  I also recommended endocrinology referral given her risk factors endorgan damage and persistently elevated A1c.  She declined at this time.  She is moving to [facility] where there is endocrinology available.  She may be amenable to this in the future  Orders: -  POCT glycosylated hemoglobin (Hb A1C) -     Lipid panel -     Amb Referral To Provider Referral Exercise Program (P.R.E.P) -     Evaristo Bury FlexTouch; Inject 45 Units into the skin 2 (two) times daily.  Dispense: 15 mL; Refill: 3 -     Ozempic (2 MG/DOSE); Inject 2 mg into the skin once a week.  Dispense: 9 mL; Refill: 3  Primary hypertension Assessment & Plan: This is not at goal.  She did not take her medications today.  Follow-up in 2 to 4 weeks at which time she will take her medications and will titrate from there.   Abnormal craving -     Naltrexone HCl; Take 1 tablet (50 mg total) by mouth daily.  Dispense: 90 tablet; Refill: 3  Hyperlipidemia, unspecified hyperlipidemia  type Assessment & Plan: Lipid panel today.  HCM- at follow up discuss if she would like to see GI and foot exam     Terisa Starr, MD  Family Medicine Teaching Service  [facility]

## 2022-08-13 ENCOUNTER — Ambulatory Visit (INDEPENDENT_AMBULATORY_CARE_PROVIDER_SITE_OTHER): Payer: Self-pay | Admitting: Family Medicine

## 2022-08-13 ENCOUNTER — Encounter: Payer: Self-pay | Admitting: Family Medicine

## 2022-08-13 VITALS — BP 155/82 | HR 72 | Wt 288.8 lb

## 2022-08-13 DIAGNOSIS — R638 Other symptoms and signs concerning food and fluid intake: Secondary | ICD-10-CM

## 2022-08-13 DIAGNOSIS — E1159 Type 2 diabetes mellitus with other circulatory complications: Secondary | ICD-10-CM

## 2022-08-13 DIAGNOSIS — I10 Essential (primary) hypertension: Secondary | ICD-10-CM

## 2022-08-13 DIAGNOSIS — E785 Hyperlipidemia, unspecified: Secondary | ICD-10-CM

## 2022-08-13 LAB — POCT GLYCOSYLATED HEMOGLOBIN (HGB A1C): HbA1c, POC (prediabetic range): 10 % — AB (ref 5.7–6.4)

## 2022-08-13 MED ORDER — NALTREXONE HCL 50 MG PO TABS: 50.0000 mg | ORAL_TABLET | Freq: Every day | ORAL | 3 refills | Status: DC

## 2022-08-13 MED ORDER — ASPIRIN 81 MG PO TBEC: 81.0000 mg | DELAYED_RELEASE_TABLET | Freq: Every day | ORAL | 11 refills | Status: DC

## 2022-08-13 MED ORDER — AMLODIPINE BESYLATE 5 MG PO TABS: 5.0000 mg | ORAL_TABLET | Freq: Every day | ORAL | 3 refills | Status: DC

## 2022-08-13 MED ORDER — NALTREXONE HCL 50 MG PO TABS
50.0000 mg | ORAL_TABLET | Freq: Every day | ORAL | 3 refills | Status: AC
Start: 2022-08-13 — End: ?

## 2022-08-13 MED ORDER — TRESIBA FLEXTOUCH 100 UNIT/ML ~~LOC~~ SOPN: 45.0000 [IU] | PEN_INJECTOR | Freq: Two times a day (BID) | SUBCUTANEOUS | 3 refills | Status: DC

## 2022-08-13 MED ORDER — ASPIRIN 81 MG PO TBEC: 81.0000 mg | DELAYED_RELEASE_TABLET | Freq: Every day | ORAL | 11 refills | Status: AC

## 2022-08-13 MED ORDER — OZEMPIC (2 MG/DOSE) 8 MG/3ML ~~LOC~~ SOPN
2.0000 mg | PEN_INJECTOR | SUBCUTANEOUS | 3 refills | Status: DC
Start: 2022-08-13 — End: 2022-08-13

## 2022-08-13 MED ORDER — OZEMPIC (2 MG/DOSE) 8 MG/3ML ~~LOC~~ SOPN
2.0000 mg | PEN_INJECTOR | SUBCUTANEOUS | 3 refills | Status: DC
Start: 2022-08-13 — End: 2023-02-09

## 2022-08-13 NOTE — Assessment & Plan Note (Signed)
We discussed the importance of improved glucose control to reduce complications such as eye disease, stroke, heart attack.  We discussed the importance of lifestyle changes as well as compliance with medications.  Refilled her insulin, she has test trips at home she will start to test again.  Refilled her Ozempic as well.  She has a had success with reducing sugar in the past on naltrexone and would like to restart this.  We discussed the benefits and risks at length and agreed to restart this medication.  She is very determined to make lifestyle changes and thus was referred to the prep program.  Follow-up in 2 to 4 weeks to further discuss glucose control.  I also recommended endocrinology referral given her risk factors endorgan damage and persistently elevated A1c.  She declined at this time.  She is moving to [facility] where there is endocrinology available.  She may be amenable to this in the future

## 2022-08-13 NOTE — Assessment & Plan Note (Signed)
This is not at goal.  She did not take her medications today.  Follow-up in 2 to 4 weeks at which time she will take her medications and will titrate from there.

## 2022-08-13 NOTE — Assessment & Plan Note (Signed)
Lipid panel today

## 2022-08-13 NOTE — Patient Instructions (Addendum)
It was wonderful to see you today.  Please bring ALL of your medications with you to every visit.   Today we talked about:  -- I sent in your refills  - I sent a referral to PREP Program at the [facility]   - Please follow up in 2-4 weeks  to discuss your sugars and blood pressure    I will message you with blood work results   Let me know if you need paperwork for your mom  Congratulations on all of your life changes!   Thank you for choosing [facility].   Please call [phone] with any questions about today's appointment.  Please be sure to schedule follow up at the front  desk before you leave today.   Terisa Starr, MD  Family Medicine

## 2022-08-14 LAB — LIPID PANEL
Chol/HDL Ratio: 5.3 ratio — ABNORMAL HIGH (ref 0.0–4.4)
Cholesterol, Total: 227 mg/dL — ABNORMAL HIGH (ref 100–199)
HDL: 43 mg/dL (ref >39)
LDL Chol Calc (NIH): 140 mg/dL — ABNORMAL HIGH (ref 0–99)
Triglycerides: 246 mg/dL — ABNORMAL HIGH (ref 0–149)
VLDL Cholesterol Cal: 44 mg/dL — ABNORMAL HIGH (ref 5–40)

## 2022-08-17 DIAGNOSIS — F331 Major depressive disorder, recurrent, moderate: Secondary | ICD-10-CM | POA: Diagnosis not present

## 2022-08-17 DIAGNOSIS — F4310 Post-traumatic stress disorder, unspecified: Secondary | ICD-10-CM | POA: Diagnosis not present

## 2022-08-18 ENCOUNTER — Telehealth: Payer: Self-pay | Admitting: *Deleted

## 2022-08-18 NOTE — Telephone Encounter (Signed)
Alternative requested for ozempic because Its not covered by insurance. Please advis. Delores Edelstein Bruna Potter, CMA

## 2022-08-19 ENCOUNTER — Telehealth: Payer: Self-pay

## 2022-08-19 ENCOUNTER — Other Ambulatory Visit (HOSPITAL_COMMUNITY): Payer: Self-pay

## 2022-08-19 NOTE — Telephone Encounter (Signed)
VMT pt requesting call back to discuss referral to PREP,next classes at [facility], check interest.

## 2022-08-20 DIAGNOSIS — F331 Major depressive disorder, recurrent, moderate: Secondary | ICD-10-CM | POA: Diagnosis not present

## 2022-08-20 DIAGNOSIS — F4310 Post-traumatic stress disorder, unspecified: Secondary | ICD-10-CM | POA: Diagnosis not present

## 2022-08-26 ENCOUNTER — Encounter: Payer: Self-pay | Admitting: Family Medicine

## 2022-08-27 ENCOUNTER — Telehealth: Payer: Self-pay

## 2022-08-27 NOTE — Telephone Encounter (Signed)
Called pt reference PREP referral Reports to be relocating to [place] soon [facility] location will work Can do MW 1p-215p starting on 09/06/22 Will send reminder of intake scheduling 09/02/22 at 11am at [place] to email as requested Has my number for contact Will meet pt in lobby for appt

## 2022-09-02 ENCOUNTER — Encounter: Payer: Self-pay | Admitting: Family Medicine

## 2022-09-02 NOTE — Telephone Encounter (Signed)
Reviewed, completed, and signed form.  Note routed to RN team inbasket and placed completed form in RN Wall pocket in the front office.  Dairon Procter M Renley Banwart, MD  

## 2022-09-02 NOTE — Progress Notes (Signed)
[facility] PREP Evaluation  Patient Details  Name: Brannon Decaire MRN: [id number] Date of Birth: 1970-09-03 Age: 52 y.o. PCP: Westley Chandler, MD  Vitals:   09/02/22 1127  BP: 122/74  Pulse: 79  SpO2: 99%  Weight: 291 lb 9.6 oz (132.3 kg)     [facility] Eval - 09/02/22 1100       [facility] "PREP" Location   [facility] "PREP" Location [facility]      Referral    Referring Provider Manson Passey    Reason for referral Hypertension;Inactivity;Obesitity/Overweight;High Cholesterol;Diabetes;Stroke    Program Start Date 09/06/22   MW 1p-215p x 12 wks     Measurement   Waist Circumference 57.5 inches    Hip Circumference 57 inches    Body fat --   unable to [employer]     Information for Trainer   Goals Lose weight, chnage lifestyle, Lose cane    Current Exercise none    Orthopedic Concerns Right knee torn meniscus, hx of right sided weakeness 2/2 stroke    Pertinent Medical History HTN, DM2, HLD, hx of Stroke in 2022    Current Barriers None    Restrictions/Precautions Fall risk;Assistive device;Diabetic snack before exercise    Medications that affect exercise Medication causing dizziness/drowsiness      Timed Up and Go (TUGS)   Timed Up and Go Moderate risk 10-12 seconds   2 recent falls over feet tangled     Mobility and Daily Activities   I find it easy to walk up or down two or more flights of stairs. 1    I have no trouble taking out the trash. 2    I do housework such as vacuuming and dusting on my own without difficulty. 1    I can easily lift a gallon of milk (8lbs). 1    I can easily walk a mile. 1    I have no trouble reaching into high cupboards or reaching down to pick up something from the floor. 1    I do not have trouble doing out-door work such as Loss adjuster, chartered, raking leaves, or gardening. 1      Mobility and Daily Activities   I feel younger than my age. 4    I feel independent. 3    I feel energetic. 2    I live an active life.  1    I feel strong. 1     I feel healthy. 1    I feel active as other people my age. 1      How fit and strong are you.   Fit and Strong Total Score 21            Past Medical History:  Diagnosis Date   Cerebrovascular disease    Depression    DM2 (diabetes mellitus, type 2) (HCC)    HLD (hyperlipidemia)    HTN (hypertension)    Past Surgical History:  Procedure Laterality Date   right eye surgery Right    had retinal hemorrhage   Social History   Tobacco Use  Smoking Status Never  Smokeless Tobacco Never    Bonnye Fava 09/02/2022, 11:43 AM

## 2022-09-03 DIAGNOSIS — F331 Major depressive disorder, recurrent, moderate: Secondary | ICD-10-CM | POA: Diagnosis not present

## 2022-09-03 DIAGNOSIS — F4310 Post-traumatic stress disorder, unspecified: Secondary | ICD-10-CM | POA: Diagnosis not present

## 2022-09-08 NOTE — Progress Notes (Signed)
[facility] PREP Weekly Session  Patient Details  Name: Amber [name] MRN: [id number] Date of Birth: January 11, 1971 Age: 52 y.o. PCP: Westley Chandler, MD  There were no vitals filed for this visit.   [facility] Weekly seesion - 09/08/22 1500       [facility] "PREP" Location   [facility] "PREP" Location [facility]      Weekly Session   Topic Discussed Goal setting and welcome to the program   Fit testing   Classes attended to date 2             Bonnye Fava 09/08/2022, 3:25 PM

## 2022-09-14 NOTE — Progress Notes (Signed)
[facility] PREP Weekly Session  Patient Details  Name: Amber [name] MRN: [id number] Date of Birth: 06-17-1970 Age: 52 y.o. PCP: Westley Chandler, MD  Vitals:   09/13/22 1300  Weight: 292 lb 3.2 oz (132.5 kg)     [facility] Weekly seesion - 09/14/22 1100       [facility] "PREP" Location   [facility] "PREP" Location [facility]      Weekly Session   Topic Discussed Importance of resistance training;Other ways to be active    Minutes exercised this week --   stretches everyday   Classes attended to date 3             Amber [name] 09/14/2022, 11:23 AM

## 2022-09-16 NOTE — Progress Notes (Signed)
    SUBJECTIVE:   CHIEF COMPLAINT: diabetes check  HPI:   Amber [name] is a 52 y.o.  with history notable for type 2 DM, CVA with residual weakness and cognitive changes, HTN, obesity presenting for follow up.  She reports doing well. She is moving to [place].   On her screener, she reported food insecurity. Denies issues today--that was in January.  In terms of her diabetes, her diet is listed below. Not interested in Nutrition. Going to Y twice a week.  BG still in 200-300. They are better when she take [place] and short acting insulin. Not taking Ozempic due to cost----she will pick up this weekend. No polyuria or polydipsia. 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 Goal weight 212. Not interested in increasing/adding medications today.  She did not take her antihypertensives or any medications this AM.  She reports 2 weeks of L medial ankle pain. Feels like a sharp pain. No numbness. Bothers her with walking. Has tried nothing for this. NO redness, swelling, or trauma. Did fall about 3 weeks ago but did not hurt ankle at that time.   PERTINENT  PMH / PSH/Family/Social History : CVA, HTN, Type 2 DM   OBJECTIVE:   BP (!) 147/77   Pulse 94   Ht 5\' 7"  (1.702 m)   Wt 292 lb (132.5 kg)   LMP 04/19/2021 (Approximate)   SpO2 100%   BMI 45.73 kg/m   Today's weight:  Last Weight  Most recent update: 09/17/2022  8:28 AM    Weight  132.5 kg (292 lb)            Review of prior weights: [employer]   09/17/22 0827  Weight: 292 lb (132.5 kg)    Foot exam in flow sheet RRR Lungs clear Talkative and appropriate Feet exam DP pulses normal Normal appearance no edema no redness TTP along posterior aspect of L medial malleolus  ASSESSMENT/PLAN:    HTN (hypertension) Not at goal, has not yet taken medications Will take before next visit in 1 month  Monitor for now   DM2 (diabetes mellitus, type 2) (HCC) Discussed increasing insulin, nutrition referral Patient declined at this time, prefers to increase activity, change diet Discussed carbohydrate intake, portion control Follow up 1 month She will send BG values   HLD (hyperlipidemia) Direct LDL at follow up   Left Ankle Pain DDX most likely tarsal tunnel vs. Posterior tibial tendinopathy Voltaren gel Referral to SM for ultrasound and possilble i  HCM Number given for GI to call to schedule colonoscopy     Terisa Starr, MD  Family Medicine Teaching Service  [facility]

## 2022-09-17 ENCOUNTER — Encounter: Payer: Self-pay | Admitting: Family Medicine

## 2022-09-17 ENCOUNTER — Other Ambulatory Visit (HOSPITAL_COMMUNITY): Payer: Self-pay

## 2022-09-17 ENCOUNTER — Ambulatory Visit (INDEPENDENT_AMBULATORY_CARE_PROVIDER_SITE_OTHER): Payer: 59 | Admitting: Family Medicine

## 2022-09-17 ENCOUNTER — Other Ambulatory Visit: Payer: Self-pay

## 2022-09-17 VITALS — BP 147/77 | HR 94 | Ht 67.0 in | Wt 292.0 lb

## 2022-09-17 DIAGNOSIS — R053 Chronic cough: Secondary | ICD-10-CM

## 2022-09-17 DIAGNOSIS — Z91014 Allergy to mammalian meats: Secondary | ICD-10-CM | POA: Diagnosis not present

## 2022-09-17 DIAGNOSIS — E785 Hyperlipidemia, unspecified: Secondary | ICD-10-CM

## 2022-09-17 DIAGNOSIS — M25572 Pain in left ankle and joints of left foot: Secondary | ICD-10-CM

## 2022-09-17 DIAGNOSIS — I10 Essential (primary) hypertension: Secondary | ICD-10-CM

## 2022-09-17 DIAGNOSIS — E1159 Type 2 diabetes mellitus with other circulatory complications: Secondary | ICD-10-CM | POA: Diagnosis not present

## 2022-09-17 MED ORDER — FLUTICASONE PROPIONATE 50 MCG/ACT NA SUSP: 2.0000 | Freq: Every day | NASAL | 6 refills | Status: DC

## 2022-09-17 MED ORDER — EPINEPHRINE 0.3 MG/0.3ML IJ SOAJ
0.3000 mg | INTRAMUSCULAR | 1 refills | Status: DC | PRN
Start: 2022-09-17 — End: 2023-09-19

## 2022-09-17 NOTE — Assessment & Plan Note (Signed)
Discussed increasing insulin, nutrition referral Patient declined at this time, prefers to increase activity, change diet Discussed carbohydrate intake, portion control Follow up 1 month She will send BG values

## 2022-09-17 NOTE — Assessment & Plan Note (Signed)
Direct LDL at follow up

## 2022-09-17 NOTE — Assessment & Plan Note (Signed)
Not at goal, has not yet taken medications Will take before next visit in 1 month  Monitor for now

## 2022-09-17 NOTE — Patient Instructions (Addendum)
It was wonderful to see you today.  Please bring ALL of your medications with you to every visit.   Today we talked about:  - Great work with your exercise - Please monitor your sugars over the next 2 weeks and send me ~ 10 readings---goal is have <150 in the morning   - Follow up in 1 month to check your sugars and repeat blood pressure--please take your medications before your visit  -I have referred you to Sports Medicine  to further evaluate your concern. If you do not received a phone call about this appointment within 2 weeks, please call our office back at [phone]. Jazmin Hartsell coordinates our referrals and can assist you in this.   Please call [facility] about your colonoscopy [phone]  Durward Mallard will message you about Ozempic Cost   Please follow up in 1 month  Thank you for choosing [facility].   Please call [phone] with any questions about today's appointment.  Please be sure to schedule follow up at the front  desk before you leave today.   Terisa Starr, MD  Family Medicine

## 2022-09-18 ENCOUNTER — Encounter: Payer: Self-pay | Admitting: Family Medicine

## 2022-09-21 ENCOUNTER — Encounter: Payer: Self-pay | Admitting: Family Medicine

## 2022-09-28 NOTE — Progress Notes (Signed)
[facility] PREP Weekly Session  Patient Details  Name: Amber [name] MRN: [id number] Date of Birth: 02/23/1971 Age: 52 y.o. PCP: Westley Chandler, MD  Vitals:   09/27/22 1300  Weight: 292 lb 12.8 oz (132.8 kg)     [facility] Weekly seesion - 09/28/22 0900       [facility] "PREP" Location   [facility] "PREP" Location [facility]      Weekly Session   Topic Discussed Health habits    Minutes exercised this week 180 minutes    Classes attended to date 5             Pam Jerral Bonito 09/28/2022, 9:53 AM

## 2022-10-05 NOTE — Progress Notes (Signed)
[facility] PREP Weekly Session  Patient Details  Name: Amber [name] MRN: [id number] Date of Birth: [birth year] Age: 52 y.o. PCP: Westley Chandler, MD  Vitals:   10/04/22 1300  Weight: 299 lb (135.6 kg)     [facility] Weekly seesion - 10/05/22 1000       [facility] "PREP" Location   [facility]" [facility]      Weekly Session   Topic Discussed Restaurant Eating   Salt and sugar demo   Minutes exercised this week 80 minutes    Classes attended to date 6             Pam Jerral Bonito 10/05/2022, 10:18 AM

## 2022-10-07 ENCOUNTER — Encounter: Payer: Self-pay | Admitting: Family Medicine

## 2022-10-08 ENCOUNTER — Telehealth: Payer: Self-pay | Admitting: Family Medicine

## 2022-10-08 NOTE — Telephone Encounter (Signed)
Paperwork completed.  Nursing please attach medication list  Reviewed, completed, and signed form.  Note routed to RN team inbasket and placed completed form in RN Wall pocket in the front office.  Westley Chandler, MD

## 2022-10-11 NOTE — Telephone Encounter (Signed)
Mychart message sent to patient. Copy made and placed in batch scanning.   Veronda Prude, RN

## 2022-10-14 NOTE — Progress Notes (Signed)
[facility] PREP Weekly Session  Patient Details  Name: Amber [name] MRN: [id number] Date of Birth: 1970/10/19 Age: 52 y.o. PCP: Westley Chandler, MD  Vitals:   10/11/22 1300  Weight: 292 lb 6.4 oz (132.6 kg)     [facility] Weekly seesion - 10/14/22 1700       [facility] "PREP" Location   [facility] "PREP" Location [facility]      Weekly Session   Topic Discussed Stress management and problem solving    Minutes exercised this week 50 minutes    Classes attended to date 8             Pam Jerral Bonito 10/14/2022, 5:07 PM

## 2022-10-18 NOTE — Telephone Encounter (Signed)
Note in mother's chart, form completed. Terisa Starr, MD  Family Medicine Teaching Service

## 2022-10-19 ENCOUNTER — Ambulatory Visit (INDEPENDENT_AMBULATORY_CARE_PROVIDER_SITE_OTHER): Payer: Self-pay | Admitting: Sports Medicine

## 2022-10-19 VITALS — BP 147/82 | Ht 66.0 in | Wt 290.0 lb

## 2022-10-19 DIAGNOSIS — M25572 Pain in left ankle and joints of left foot: Secondary | ICD-10-CM

## 2022-10-19 MED ORDER — MELOXICAM 15 MG PO TABS: ORAL_TABLET | ORAL | 0 refills | Status: DC

## 2022-10-19 NOTE — Progress Notes (Signed)
   Subjective:    Patient ID: Amber [name], female    DOB: 09-20-1970, 52 y.o.   MRN: [id number]  HPI chief complaint: Left ankle pain  Very pleasant 52 year old female comes in today complaining of chronic medial ankle pain that she describes as a burning type pain.  Her PCP referred her to [place] for evaluation.  She denies any trauma.  She has tried some meloxicam which has been beneficial.  Otherwise, no specific treatment.  She has noticed swelling in the medial ankle too.  Past medical history reviewed Medications reviewed Allergies reviewed    Review of Systems As above    Objective:   Physical Exam  Well-developed, well-nourished.  No acute distress  Left ankle: Severe pes planus with standing.  She is tender to palpation along the medial ankle.  She describes a burning type sensation along the medial foot and ankle to the great toe.  There is some moderate soft tissue swelling.  Good pulses.  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  We will prescribe meloxicam 15 mg daily for 5 days then as needed.  She needs to invest in some good supportive tennis shoes or walking shoes.  Follow-up for ongoing or recalcitrant issues.

## 2022-10-19 NOTE — Addendum Note (Signed)
Addended by: Merrilyn Puma on: 10/19/2022 11:09 AM   Modules accepted: Orders

## 2022-10-21 NOTE — Progress Notes (Signed)
[facility] PREP Weekly Session  Patient Details  Name: Coletha Arrasmith MRN: [id number] Date of Birth: [birth year] Age: 52 y.o. PCP: Westley Chandler, MD  Vitals:   10/18/22 1300  Weight: 290 lb 12.8 oz (131.9 kg)     [facility] Weekly seesion - 10/21/22 1100       [facility] "PREP" Location   [facility] "PREP" Location [facility]      Weekly Session   Topic Discussed Expectations and non-scale victories    Minutes exercised this week 240 minutes    Classes attended to date 26             Pam Jerral Bonito 10/21/2022, 11:56 AM

## 2022-10-29 ENCOUNTER — Encounter: Payer: Self-pay | Admitting: Family Medicine

## 2022-11-02 DIAGNOSIS — F331 Major depressive disorder, recurrent, moderate: Secondary | ICD-10-CM | POA: Diagnosis not present

## 2022-11-02 DIAGNOSIS — F4310 Post-traumatic stress disorder, unspecified: Secondary | ICD-10-CM | POA: Diagnosis not present

## 2022-11-07 ENCOUNTER — Encounter: Payer: Self-pay | Admitting: Family Medicine

## 2022-11-10 MED ORDER — BD PEN NEEDLE ORIGINAL U/F 29G X 12.7MM MISC: 12 refills | Status: DC

## 2022-11-12 ENCOUNTER — Other Ambulatory Visit: Payer: Self-pay

## 2022-11-12 DIAGNOSIS — E1159 Type 2 diabetes mellitus with other circulatory complications: Secondary | ICD-10-CM

## 2022-11-12 MED ORDER — NOVOLOG FLEXPEN 100 UNIT/ML ~~LOC~~ SOPN
40.0000 [IU] | PEN_INJECTOR | Freq: Two times a day (BID) | SUBCUTANEOUS | 11 refills | Status: DC
Start: 2022-11-12 — End: 2023-01-12

## 2022-11-15 ENCOUNTER — Encounter: Payer: Self-pay | Admitting: Family Medicine

## 2022-11-16 ENCOUNTER — Other Ambulatory Visit: Payer: Self-pay | Admitting: Sports Medicine

## 2022-11-16 MED ORDER — BD PEN NEEDLE ORIGINAL U/F 29G X 12.7MM MISC: 12 refills | Status: DC

## 2022-11-16 NOTE — Progress Notes (Signed)
[facility] PREP Weekly Session  Patient Details  Name: Amber [name] MRN: [id number] Date of Birth: 1971/02/05 Age: 52 y.o. PCP: Westley Chandler, MD  Vitals:   11/15/22 1300  Weight: 293 lb 6.4 oz (133.1 kg)     [facility] Weekly seesion - 11/16/22 1700       [facility] "PREP" Location   [facility]      Weekly Session   Topic Discussed Hitting roadblocks    Minutes exercised this week 160 minutes    Classes attended to date 12             Amber [name] 11/16/2022, 5:11 PM

## 2022-11-18 DIAGNOSIS — Z20822 Contact with and (suspected) exposure to covid-19: Secondary | ICD-10-CM | POA: Diagnosis not present

## 2022-11-18 DIAGNOSIS — Z6841 Body Mass Index (BMI) 40.0 and over, adult: Secondary | ICD-10-CM | POA: Diagnosis not present

## 2022-11-18 DIAGNOSIS — R030 Elevated blood-pressure reading, without diagnosis of hypertension: Secondary | ICD-10-CM | POA: Diagnosis not present

## 2022-11-18 MED ORDER — BD PEN NEEDLE ORIGINAL U/F 29G X 12.7MM MISC: 12 refills | Status: DC

## 2022-11-18 NOTE — Addendum Note (Signed)
Addended byManson Passey, Batu Cassin on: 11/18/2022 04:22 PM   Modules accepted: Orders

## 2022-11-19 DIAGNOSIS — F331 Major depressive disorder, recurrent, moderate: Secondary | ICD-10-CM | POA: Diagnosis not present

## 2022-11-19 DIAGNOSIS — F4310 Post-traumatic stress disorder, unspecified: Secondary | ICD-10-CM | POA: Diagnosis not present

## 2022-11-23 DIAGNOSIS — F4310 Post-traumatic stress disorder, unspecified: Secondary | ICD-10-CM | POA: Diagnosis not present

## 2022-11-23 DIAGNOSIS — F331 Major depressive disorder, recurrent, moderate: Secondary | ICD-10-CM | POA: Diagnosis not present

## 2022-11-25 ENCOUNTER — Other Ambulatory Visit: Payer: Self-pay | Admitting: *Deleted

## 2022-11-25 DIAGNOSIS — I10 Essential (primary) hypertension: Secondary | ICD-10-CM

## 2022-11-25 DIAGNOSIS — E1159 Type 2 diabetes mellitus with other circulatory complications: Secondary | ICD-10-CM

## 2022-11-25 MED ORDER — TRESIBA FLEXTOUCH 100 UNIT/ML ~~LOC~~ SOPN: 45.0000 [IU] | PEN_INJECTOR | Freq: Two times a day (BID) | SUBCUTANEOUS | 3 refills | Status: DC

## 2022-11-25 MED ORDER — VALSARTAN-HYDROCHLOROTHIAZIDE 160-25 MG PO TABS: 1.0000 | ORAL_TABLET | Freq: Every day | ORAL | 3 refills | Status: DC

## 2022-11-29 NOTE — Progress Notes (Signed)
[facility] PREP Evaluation  Patient Details  Name: Amber [name] MRN: [id number] Date of Birth: [birth year] Age: 52 y.o. PCP: Westley Chandler, MD  Vitals:   11/29/22 1300  BP: 132/70  Pulse: (!) 105  SpO2: 97%  Weight: 289 lb 3.2 oz (131.2 kg)     [facility] Eval - 11/29/22 1400       [facility] "PREP" Location   [facility] "PREP" Location [facility]      Referral    Program Start Date 09/06/22    Program End Date 11/29/22      Measurement   Waist Circumference 57.5 inches    Waist Circumference End Program 56 inches    Hip Circumference 57 inches    Hip Circumference End Program 54 inches    Body fat --   unable to calculate     Information for Trainer   Goals Be consistent with exercise, get A1C down to 7, am BS 100      Mobility and Daily Activities   I find it easy to walk up or down two or more flights of stairs. 2    I have no trouble taking out the trash. 2    I do housework such as vacuuming and dusting on my own without difficulty. 2    I can easily lift a gallon of milk (8lbs). 2    I can easily walk a mile. 1    I have no trouble reaching into high cupboards or reaching down to pick up something from the floor. 2    I do not have trouble doing out-door work such as Loss adjuster, chartered, raking leaves, or gardening. 2      Mobility and Daily Activities   I feel younger than my age. 2    I feel independent. 2    I feel energetic. 3    I live an active life.  2    I feel strong. 2    I feel healthy. 2    I feel active as other people my age. 1      How fit and strong are you.   Fit and Strong Total Score 27            Past Medical History:  Diagnosis Date   Cerebrovascular disease    Depression    DM2 (diabetes mellitus, type 2) (HCC)    HLD (hyperlipidemia)    HTN (hypertension)    Past Surgical History:  Procedure Laterality Date   right eye surgery Right    had retinal hemorrhage   Social History   Tobacco Use  Smoking Status Never  Smokeless  Tobacco Never  Attended 14 sessions, 8 of 12 educational topics Fit testing: Cardio march: 164 to 230 Sit to stand: 10 to 11 Bicep curls: 10 to 15 Balance much improved  Pam M Claudette Wermuth 11/29/2022, 2:23 PM

## 2022-12-03 DIAGNOSIS — F331 Major depressive disorder, recurrent, moderate: Secondary | ICD-10-CM | POA: Diagnosis not present

## 2022-12-03 DIAGNOSIS — F4310 Post-traumatic stress disorder, unspecified: Secondary | ICD-10-CM | POA: Diagnosis not present

## 2022-12-10 DIAGNOSIS — F331 Major depressive disorder, recurrent, moderate: Secondary | ICD-10-CM | POA: Diagnosis not present

## 2022-12-10 DIAGNOSIS — F4310 Post-traumatic stress disorder, unspecified: Secondary | ICD-10-CM | POA: Diagnosis not present

## 2022-12-14 DIAGNOSIS — F331 Major depressive disorder, recurrent, moderate: Secondary | ICD-10-CM | POA: Diagnosis not present

## 2022-12-14 DIAGNOSIS — F4310 Post-traumatic stress disorder, unspecified: Secondary | ICD-10-CM | POA: Diagnosis not present

## 2022-12-17 DIAGNOSIS — F331 Major depressive disorder, recurrent, moderate: Secondary | ICD-10-CM | POA: Diagnosis not present

## 2022-12-17 DIAGNOSIS — F4310 Post-traumatic stress disorder, unspecified: Secondary | ICD-10-CM | POA: Diagnosis not present

## 2022-12-23 DIAGNOSIS — F331 Major depressive disorder, recurrent, moderate: Secondary | ICD-10-CM | POA: Diagnosis not present

## 2022-12-23 DIAGNOSIS — F4310 Post-traumatic stress disorder, unspecified: Secondary | ICD-10-CM | POA: Diagnosis not present

## 2022-12-31 DIAGNOSIS — F331 Major depressive disorder, recurrent, moderate: Secondary | ICD-10-CM | POA: Diagnosis not present

## 2022-12-31 DIAGNOSIS — F4310 Post-traumatic stress disorder, unspecified: Secondary | ICD-10-CM | POA: Diagnosis not present

## 2023-01-12 ENCOUNTER — Encounter: Payer: Self-pay | Admitting: Family Medicine

## 2023-01-12 DIAGNOSIS — E1159 Type 2 diabetes mellitus with other circulatory complications: Secondary | ICD-10-CM

## 2023-01-12 MED ORDER — BD PEN NEEDLE ORIGINAL U/F 29G X 12.7MM MISC: 12 refills | Status: DC

## 2023-01-12 MED ORDER — NOVOLOG FLEXPEN 100 UNIT/ML ~~LOC~~ SOPN
40.0000 [IU] | PEN_INJECTOR | Freq: Two times a day (BID) | SUBCUTANEOUS | 11 refills | Status: DC
Start: 2023-01-12 — End: 2023-02-09

## 2023-01-14 DIAGNOSIS — F331 Major depressive disorder, recurrent, moderate: Secondary | ICD-10-CM | POA: Diagnosis not present

## 2023-01-14 DIAGNOSIS — F4310 Post-traumatic stress disorder, unspecified: Secondary | ICD-10-CM | POA: Diagnosis not present

## 2023-01-19 DIAGNOSIS — F3341 Major depressive disorder, recurrent, in partial remission: Secondary | ICD-10-CM | POA: Diagnosis not present

## 2023-01-19 DIAGNOSIS — R41840 Attention and concentration deficit: Secondary | ICD-10-CM | POA: Diagnosis not present

## 2023-01-19 DIAGNOSIS — F411 Generalized anxiety disorder: Secondary | ICD-10-CM | POA: Diagnosis not present

## 2023-01-19 DIAGNOSIS — Z5181 Encounter for therapeutic drug level monitoring: Secondary | ICD-10-CM | POA: Diagnosis not present

## 2023-01-19 DIAGNOSIS — F4310 Post-traumatic stress disorder, unspecified: Secondary | ICD-10-CM | POA: Diagnosis not present

## 2023-01-20 ENCOUNTER — Encounter: Payer: Self-pay | Admitting: Family Medicine

## 2023-01-25 ENCOUNTER — Ambulatory Visit (INDEPENDENT_AMBULATORY_CARE_PROVIDER_SITE_OTHER): Payer: 59 | Admitting: Family Medicine

## 2023-01-25 ENCOUNTER — Encounter: Payer: Self-pay | Admitting: Family Medicine

## 2023-01-25 ENCOUNTER — Other Ambulatory Visit: Payer: Self-pay

## 2023-01-25 VITALS — BP 162/79 | HR 89 | Ht 66.0 in | Wt 315.0 lb

## 2023-01-25 DIAGNOSIS — R600 Localized edema: Secondary | ICD-10-CM | POA: Diagnosis not present

## 2023-01-25 DIAGNOSIS — E1159 Type 2 diabetes mellitus with other circulatory complications: Secondary | ICD-10-CM | POA: Diagnosis not present

## 2023-01-25 LAB — POCT GLYCOSYLATED HEMOGLOBIN (HGB A1C): HbA1c, POC (controlled diabetic range): 10.7 % — AB (ref 0.0–7.0)

## 2023-01-25 NOTE — Assessment & Plan Note (Signed)
Uncontrolled with Ha1c today of 10.7%. Pt has been off Ozempic as this made her nauseous but is willing to retry. Also reports worse diet recently. - Pt will work on weight loss and improving diet - Restart Ozempic at lowest dose - Will follow-up with PCP on 02/09/23 (pt already has appointment)

## 2023-01-25 NOTE — Patient Instructions (Signed)
Amber [name],  It was lovely seeing you in clinic today! You came in with swelling of your legs. We think this is due to a couple different reasons, such as your increased Meloxicam use and your recent weight gain. We will be doing some blood work to rule out other causes of edema, such as from your kidneys, liver, or heart; these results will be available in your MyChart.  There are a few things for you to do after today's visit: Try eating more fruits and vegetables in your diet, and limiting sugars, fats, and carbs Try to stay active with little walks as you are able Elevate your legs when you're at rest and use compression stockings Avoid meloxicam Use your CPAP at night Try restarting your Ozempic at the lowest dose  Follow-up with Dr. Manson Passey on 02/09/23; you can discuss getting a DMV placard at this time! If your leg swelling gets worse, especially if you have new shortness of breath and/or chest pain, please let us know as soon as possible.  Thank you for allowing [place] to be a part of your care team! Governor Rooks, medical student Dr. Oda Cogan

## 2023-01-25 NOTE — Assessment & Plan Note (Signed)
Current BMI of 50.84; pt has gained 26 lb since the start of 11/2022. She reports less physical activity and poorer dietary choices over past months along with increased life stress. - Pt to work on improving diet with more fruits and vegetables and less fats, sugars, and carbs - Will also work on increasing activity level; can consider going back to [employer] program

## 2023-01-25 NOTE — Assessment & Plan Note (Signed)
Pt with 2 weeks of bilateral lower extremity edema and mild, diffuse pain with 1+ bilateral edema on exam. Most likely edema is related to recent weight gain and increased Meloxicam use. Also considering CHF, though less likely given no reported dyspnea and no known heart disease; will evaluate with BNP. Considering kidney disease, liver disease, anemia, thyroid disease; will evaluate with labs. Less likely is bilateral DVTs given to extended period of rest, no recent travel, no calf-only tenderness. If edema does not improve, can consider Pelvic US for workup of ovarian cancer. - Recommend elevating legs and using compression stockings to help control edema - Encouraged working on weight loss - Will get labs: CMP, CBC, BNP, TSH - Return precautions given: pt to call office if edema does not improve, especially in the setting of new chest pain or shortness of breath

## 2023-01-25 NOTE — Progress Notes (Signed)
SUBJECTIVE:   CHIEF COMPLAINT / HPI:   Amber [name] is a 52 y.o. female who presents today for leg swelling.  Pt was at a concert 2 weeks ago. After the concert, she stood up and her right leg gave out and she fell. She hit the chair in front of her with her right shoulder. No loss of consciousness, no head injury. She has had back pain since, currently being managed by a chiropractor. She continues to report swelling in both legs since the concert. She reports pain in both legs at night. Pt reports edema has improved a little since she stopped using Meloxicam; she had been taking increased amounts of this following her fall.  She walks with a cane at baseline. Since experiencing edema, she reports increased unsteadiness with walking. No dizziness, vertigo with walking. No chest pain, no shortness of breath with exertion.   She reports she's the heaviest she's been in a long time. She reports she recently moved in with a friend in [place] and is no longer controlling her own diet. She has gained 26 lb since the start of 11/2022.  She has OSA but is not using CPAP.  Was taking Diosvein to help with cardiovascular health previously.  PERTINENT  PMH / PSH: HTN, HLD, OSA, T2DM, morbid obesity  OBJECTIVE:   BP (!) 162/79   Pulse 89   Ht 5\' 6"  (1.676 m)   Wt (!) 315 lb (142.9 kg)   LMP 04/19/2021 (Approximate)   SpO2 98%   BMI 50.84 kg/m   General: Pt seated in chair, no acute distress. Cardiovascular: RRR, no murmurs, rubs, gallops. Pulmonary: Normal work of breathing. Lungs clear to auscultation bilaterally. Extremities: Possible 1+ BLE pitting edema versus lipidemia. 2+ DP/PT pulses bilaterally. Minimal tenderness to palpation throughout legs. Neuro/Psych: Alert and oriented to person, place, event, time. Normal affect.   ASSESSMENT/PLAN:   Pedal edema Pt with 2 weeks of bilateral lower extremity edema and mild, diffuse pain with 1+ bilateral edema on exam. Most likely  edema is related to recent weight gain and increased Meloxicam use. Also considering CHF, though less likely given no reported dyspnea and no known heart disease; will evaluate with BNP. Considering kidney disease, liver disease, anemia, thyroid disease; will evaluate with labs. Less likely is bilateral DVTs given to extended period of rest, no recent travel, no calf-only tenderness. If edema does not improve, can consider Pelvic US for workup of ovarian cancer. - Recommend elevating legs and using compression stockings to help control edema - Encouraged working on weight loss - Will get labs: CMP, CBC, BNP, TSH - Return precautions given: pt to call office if edema does not improve, especially in the setting of new chest pain or shortness of breath  DM2 (diabetes mellitus, type 2) (HCC) Uncontrolled with Ha1c today of 10.7%. Pt has been off Ozempic as this made her nauseous but is willing to retry. Also reports worse diet recently. - Pt will work on weight loss and improving diet - Restart Ozempic at lowest dose - Will follow-up with PCP on 02/09/23 (pt already has appointment)  Morbid obesity (HCC) Current BMI of 50.84; pt has gained 26 lb since the start of 11/2022. She reports less physical activity and poorer dietary choices over past months along with increased life stress. - Pt to work on improving diet with more fruits and vegetables and less fats, sugars, and carbs - Will also work on increasing activity level; can consider going back  to [employer] program    Governor Rooks, Medical Student [facility]

## 2023-01-27 LAB — CMP14+EGFR
ALT: 15 IU/L (ref 0–32)
AST: 14 [IU]/L (ref 0–40)
Albumin: 3.9 g/dL (ref 3.8–4.9)
Alkaline Phosphatase: 104 [IU]/L (ref 44–121)
BUN/Creatinine Ratio: 17 (ref 9–23)
BUN: 10 mg/dL (ref 6–24)
Bilirubin Total: 0.3 mg/dL (ref 0.0–1.2)
CO2: 24 mmol/L (ref 20–29)
Calcium: 9.7 mg/dL (ref 8.7–10.2)
Chloride: 102 mmol/L (ref 96–106)
Creatinine, Ser: 0.6 mg/dL (ref 0.57–1.00)
Globulin, Total: 2.6 g/dL (ref 1.5–4.5)
Glucose: 118 mg/dL — ABNORMAL HIGH (ref 70–99)
Potassium: 4.3 mmol/L (ref 3.5–5.2)
Sodium: 141 mmol/L (ref 134–144)
Total Protein: 6.5 g/dL (ref 6.0–8.5)
eGFR: 107 mL/min/{1.73_m2} (ref >59)

## 2023-01-27 LAB — CBC
Hematocrit: 41.3 % (ref 34.0–46.6)
Hemoglobin: 12.9 g/dL (ref 11.1–15.9)
MCH: 25.2 pg — ABNORMAL LOW (ref 26.6–33.0)
MCHC: 31.2 g/dL — ABNORMAL LOW (ref 31.5–35.7)
MCV: 81 fL (ref 79–97)
Platelets: 302 10*3/uL (ref 150–450)
RBC: 5.12 x10E6/uL (ref 3.77–5.28)
RDW: 14.9 % (ref 11.7–15.4)
WBC: 7.9 10*3/uL (ref 3.4–10.8)

## 2023-01-27 LAB — BRAIN NATRIURETIC PEPTIDE: BNP: 19.7 pg/mL (ref 0.0–100.0)

## 2023-01-27 LAB — TSH: TSH: 1.82 u[IU]/mL (ref 0.450–4.500)

## 2023-01-28 DIAGNOSIS — F4310 Post-traumatic stress disorder, unspecified: Secondary | ICD-10-CM | POA: Diagnosis not present

## 2023-01-28 DIAGNOSIS — F331 Major depressive disorder, recurrent, moderate: Secondary | ICD-10-CM | POA: Diagnosis not present

## 2023-02-08 NOTE — Progress Notes (Unsigned)
SUBJECTIVE:   CHIEF COMPLAINT: diabetes check up HPI:   Amber [name] is a 52 y.o.  with history notable for HTN, type 2 DM and obesity  presenting for follow up.  The patient was recently seen for profound lower extremity edema and weight gain.  She stopped her Mobic this is markedly improved.  She denies dyspnea, chest pain, palpitations.  She is lost 13 pound since her last visit part of this is due to dietary and lifestyle changes she also feels like this is related to fluid when she stopped her Mobic.  The patient reports she is having some difficulty with her diabetes medications.  She does not have her Ozempic.  Her insurance no longer covers her Humalog.  She denies polyuria polydipsia, nausea or vomiting.  Her last A1c was in the 10.7.  The patient reports she continues to live in [place].  Her and her girlfriend are focusing on healthy eating habits.  She is engaging intermittent fasting.  She is reading a book by Dr. Wylene Simmer called the obesity code.  She has restarted her naltrexone and feels like she is really getting things under control.  Her mom continues to live in [place] and is doing well there.  The patient has had no falls recently but does use a cane to ambulate and has some gait unsteadiness.  Her dog Amber [name] recently died and she has a new dog named Amber [name].   She reports her dog's death did cause her some anxiety and stress but this is improving slowly.  She follows with therapy and psychiatry. PERTINENT  PMH / PSH/Family/Social History : History of obesity, cerebrovascular accident, type 2 diabetes which has been difficult to control.  OBJECTIVE:   BP (!) 122/51   Pulse 88   Ht 5\' 6"  (1.676 m)   Wt (!) 302 lb (137 kg)   LMP 04/19/2021 (Approximate)   SpO2 97%   BMI 48.74 kg/m   Today's weight:  Last Weight  Most recent update: 02/09/2023 11:46 AM    Weight  137 kg (302 lb)              Review of prior weights: Filed Weights   02/09/23 1145   Weight: (!) 302 lb (137 kg)  Pleasant appearing woman in no distress.  Lungs are clear bilaterally.  Cardiac exam regular rate and rhythm no murmurs rubs or gallops.  There is no lower extremity edema on my exam today which is significantly per from prior.    ASSESSMENT/PLAN:   Assessment & Plan Type 2 diabetes mellitus with other circulatory complication, without long-term current use of insulin (HCC) Not controlled with complications of stroke in past She is  not currently taking her GLP-1 agonist.  We discussed at length.  Will trial Mounjaro given she has not had good success with diabetic control with Ozempic in the past.  She has missed several doses of her NovoLog and her insurance is no longer covering this.  I have switched her to insulin lispro and sent this to her pharmacy.  Continue with her long-acting insulin 45 units twice daily.  Her sugars are running in the 200s which is actually improved from prior.  She is on a statin medication as well as an ARB.  Once her A1c is under better control we will start SGLT2.  I have concerns about dehydration and urinary tract infections in the setting of such a high A1c and starting SGLT2 inhibitor. OSA (obstructive sleep  apnea) Uses CPAP History of CVA (cerebrovascular accident) Has persistent weakness and is at risk for falls.  The home she is in now is one-story.  However the laundry is downstairs and there is a 45 degree incline on a path that can take her to the basement for this.  She has difficulty with distances given her gait imbalance.  Referral to neurorehab for evaluation of potential motorized scooter. Morbid obesity (HCC) Reading Dr. Harley Hallmark Book Trialing intermittent fasting and low-carb diet.  We discussed at length.  Will continue to discuss options with including nutrition referral and continuous glucose monitor.  May benefit from referral to healthy weight and wellness and/or lifestyle medicine in the future. Bilateral lower  extremity edema This is marked the improved suspected NSAIDs.  Her labs were relatively unremarkable.  Her BNP could be normal in the setting of her obesity will repeat echocardiogram given she is lost 13 pounds simply by coming off her Mobic Falls frequently No further falls but at risk referred for motorized scooter as above Encounter for immunization COVID and given today Moderate episode of recurrent major depressive disorder (HCC) Follows with psychiatry  She is doing well with her girlfriend  Her dog recently died who serves as an emotional support animal she now has a new dog who is a Corgi/Daschund mix named Amber [name] .  This dog provides support when she is having anxiety symptoms.  Letter given to have the dog stay in her home. Primary hypertension Doing well other diastolic is low she has no symptoms of this will continue to monitor and continue current medications. At next visit needs UACR and discuss going to see gastroenterology to whom she was previously referred for colonoscopy. Titrate up on Dudley Major, MD  Family Medicine Teaching Service  [facility]

## 2023-02-09 ENCOUNTER — Encounter: Payer: Self-pay | Admitting: Family Medicine

## 2023-02-09 ENCOUNTER — Ambulatory Visit (INDEPENDENT_AMBULATORY_CARE_PROVIDER_SITE_OTHER): Payer: 59 | Admitting: Family Medicine

## 2023-02-09 VITALS — BP 122/51 | HR 88 | Ht 66.0 in | Wt 302.0 lb

## 2023-02-09 DIAGNOSIS — R600 Localized edema: Secondary | ICD-10-CM | POA: Diagnosis not present

## 2023-02-09 DIAGNOSIS — R296 Repeated falls: Secondary | ICD-10-CM

## 2023-02-09 DIAGNOSIS — Z23 Encounter for immunization: Secondary | ICD-10-CM | POA: Diagnosis not present

## 2023-02-09 DIAGNOSIS — E1159 Type 2 diabetes mellitus with other circulatory complications: Secondary | ICD-10-CM

## 2023-02-09 DIAGNOSIS — F331 Major depressive disorder, recurrent, moderate: Secondary | ICD-10-CM | POA: Diagnosis not present

## 2023-02-09 DIAGNOSIS — G4733 Obstructive sleep apnea (adult) (pediatric): Secondary | ICD-10-CM | POA: Diagnosis not present

## 2023-02-09 DIAGNOSIS — I10 Essential (primary) hypertension: Secondary | ICD-10-CM

## 2023-02-09 DIAGNOSIS — Z8673 Personal history of transient ischemic attack (TIA), and cerebral infarction without residual deficits: Secondary | ICD-10-CM | POA: Diagnosis not present

## 2023-02-09 MED ORDER — TIRZEPATIDE 2.5 MG/0.5ML ~~LOC~~ SOAJ: 2.5000 mg | SUBCUTANEOUS | 1 refills | Status: DC

## 2023-02-09 MED ORDER — INSULIN LISPRO (1 UNIT DIAL) 100 UNIT/ML (KWIKPEN)
PEN_INJECTOR | SUBCUTANEOUS | 11 refills | Status: DC
Start: 2023-02-09 — End: 2023-03-31

## 2023-02-09 NOTE — Assessment & Plan Note (Addendum)
Follows with psychiatry  She is doing well with her girlfriend  Her dog recently died who serves as an emotional support animal she now has a new dog who is a Corgi/Daschund mix named pip .  This dog provides support when she is having anxiety symptoms.  Letter given to have the dog stay in her home.

## 2023-02-09 NOTE — Assessment & Plan Note (Addendum)
Not controlled with complications of stroke in past She is  not currently taking her GLP-1 agonist.  We discussed at length.  Will trial Mounjaro given she has not had good success with diabetic control with Ozempic in the past.  She has missed several doses of her NovoLog and her insurance is no longer covering this.  I have switched her to insulin lispro and sent this to her pharmacy.  Continue with her long-acting insulin 45 units twice daily.  Her sugars are running in the 200s which is actually improved from prior.  She is on a statin medication as well as an ARB.  Once her A1c is under better control we will start SGLT2.  I have concerns about dehydration and urinary tract infections in the setting of such a high A1c and starting SGLT2 inhibitor.

## 2023-02-09 NOTE — Assessment & Plan Note (Addendum)
Doing well other diastolic is low she has no symptoms of this will continue to monitor and continue current medications.

## 2023-02-09 NOTE — Assessment & Plan Note (Signed)
Uses CPAP 

## 2023-02-09 NOTE — Assessment & Plan Note (Addendum)
Has persistent weakness and is at risk for falls.  The home she is in now is one-story.  However the laundry is downstairs and there is a 45 degree incline on a path that can take her to the basement for this.  She has difficulty with distances given her gait imbalance.  Referral to neurorehab for evaluation of potential motorized scooter.

## 2023-02-09 NOTE — Assessment & Plan Note (Addendum)
Reading Dr. Harley Hallmark Book Trialing intermittent fasting and low-carb diet.  We discussed at length.  Will continue to discuss options with including nutrition referral and continuous glucose monitor.  May benefit from referral to healthy weight and wellness and/or lifestyle medicine in the future.

## 2023-02-09 NOTE — Patient Instructions (Addendum)
It was wonderful to see you today.  Please bring ALL of your medications with you to every visit.   Today we talked about:  - Follow up in 1 month  - I sent in your Mounjaro  CALL ME IF THIS IS NOT COVERED  I sent in an alternative insulin   I sent in a referral to rehab about the scooter   Please follow up in 1 months for labs    Thank you for choosing [facility].   Please call [phone] with any questions about today's appointment.  Please be sure to schedule follow up at the front  desk before you leave today.   Terisa Starr, MD  Family Medicine

## 2023-02-11 DIAGNOSIS — F4310 Post-traumatic stress disorder, unspecified: Secondary | ICD-10-CM | POA: Diagnosis not present

## 2023-02-11 DIAGNOSIS — F331 Major depressive disorder, recurrent, moderate: Secondary | ICD-10-CM | POA: Diagnosis not present

## 2023-02-24 ENCOUNTER — Ambulatory Visit: Payer: 59 | Attending: Internal Medicine

## 2023-02-24 DIAGNOSIS — R600 Localized edema: Secondary | ICD-10-CM

## 2023-02-24 MED ORDER — PERFLUTREN LIPID MICROSPHERE
1.0000 mL | INTRAVENOUS | Status: AC | PRN
  Administered 2023-02-24: 5 mL via INTRAVENOUS

## 2023-02-25 LAB — ECHOCARDIOGRAM COMPLETE
AR max vel: 2.24 cm2
AV Area VTI: 2.48 cm2
AV Area mean vel: 2.01 cm2
AV Mean grad: 6 mm[Hg]
AV Peak grad: 9.4 mm[Hg]
Ao pk vel: 1.53 m/s
Area-P 1/2: 2.32 cm2
Calc EF: 69.9 %
MV VTI: 4.05 cm2
S' Lateral: 2.9 cm
Single Plane A2C EF: 72.1 %
Single Plane A4C EF: 66.2 %

## 2023-03-01 ENCOUNTER — Encounter: Payer: Self-pay | Admitting: Family Medicine

## 2023-03-02 ENCOUNTER — Telehealth: Payer: Self-pay | Admitting: Family Medicine

## 2023-03-02 DIAGNOSIS — E1159 Type 2 diabetes mellitus with other circulatory complications: Secondary | ICD-10-CM

## 2023-03-02 MED ORDER — TIRZEPATIDE-WEIGHT MANAGEMENT 5 MG/0.5ML ~~LOC~~ SOLN: 5.0000 mg | SUBCUTANEOUS | 0 refills | Status: DC

## 2023-03-02 NOTE — Telephone Encounter (Signed)
Called patient. BG running 170-180. Feels great on 2.5 mg of Mounjaro. Increased to 5 mg dose.  Terisa Starr, MD  Family Medicine Teaching Service

## 2023-03-04 ENCOUNTER — Telehealth: Payer: Self-pay

## 2023-03-04 DIAGNOSIS — Z794 Long term (current) use of insulin: Secondary | ICD-10-CM

## 2023-03-04 NOTE — Telephone Encounter (Signed)
[facility]bound 5 mg is not covered by patient's insurance, [facility] [address] Thanks! Penni Bombard CMA

## 2023-03-07 ENCOUNTER — Other Ambulatory Visit (HOSPITAL_COMMUNITY): Payer: Self-pay

## 2023-03-08 MED ORDER — MOUNJARO 5 MG/0.5ML ~~LOC~~ SOAJ
5.0000 mg | SUBCUTANEOUS | 0 refills | Status: DC
Start: 2023-03-08 — End: 2023-04-04

## 2023-03-08 NOTE — Telephone Encounter (Signed)
Alternative sent

## 2023-03-11 ENCOUNTER — Other Ambulatory Visit: Payer: Self-pay

## 2023-03-11 DIAGNOSIS — E785 Hyperlipidemia, unspecified: Secondary | ICD-10-CM

## 2023-03-11 MED ORDER — ATORVASTATIN CALCIUM 80 MG PO TABS: 80.0000 mg | ORAL_TABLET | Freq: Every day | ORAL | 3 refills | Status: DC

## 2023-03-14 ENCOUNTER — Encounter: Payer: Self-pay | Admitting: Family Medicine

## 2023-03-27 ENCOUNTER — Encounter: Payer: Self-pay | Admitting: Family Medicine

## 2023-03-28 LAB — HM DIABETES EYE EXAM

## 2023-03-28 NOTE — Telephone Encounter (Signed)
 Called and discussed Recommended SNF for mother given needs CCM messaged Asked daughter to confirm patient's ALF had repeated labs   Terisa Starr, MD  [facility] Medicine Teaching Service

## 2023-03-31 ENCOUNTER — Encounter: Payer: Self-pay | Admitting: Family Medicine

## 2023-03-31 DIAGNOSIS — E1159 Type 2 diabetes mellitus with other circulatory complications: Secondary | ICD-10-CM

## 2023-03-31 MED ORDER — INSULIN LISPRO (1 UNIT DIAL) 100 UNIT/ML (KWIKPEN): PEN_INJECTOR | SUBCUTANEOUS | 11 refills | Status: DC

## 2023-04-04 ENCOUNTER — Telehealth: Payer: Self-pay | Admitting: Pharmacist

## 2023-04-04 ENCOUNTER — Encounter: Payer: Self-pay | Admitting: Pharmacist

## 2023-04-04 DIAGNOSIS — E1159 Type 2 diabetes mellitus with other circulatory complications: Secondary | ICD-10-CM

## 2023-04-04 MED ORDER — MOUNJARO 7.5 MG/0.5ML ~~LOC~~ SOAJ: 7.5000 mg | SUBCUTANEOUS | 1 refills | Status: DC

## 2023-04-04 MED ORDER — TRESIBA FLEXTOUCH 100 UNIT/ML ~~LOC~~ SOPN: 40.0000 [IU] | PEN_INJECTOR | Freq: Two times a day (BID) | SUBCUTANEOUS | Status: DC

## 2023-04-04 MED ORDER — INSULIN LISPRO (1 UNIT DIAL) 100 UNIT/ML (KWIKPEN): 40.0000 [IU] | PEN_INJECTOR | Freq: Two times a day (BID) | SUBCUTANEOUS | Status: DC

## 2023-04-04 NOTE — Telephone Encounter (Signed)
 Patient contacted for follow-up of glycemic control.   Determined current diabetes drug regimen:  Mounjaro  (tirzepatice) 5mg  - last dose of 4 was last Tuesday. Tresiba  (insulin  degludec) 45 units BID Humalog  (insulin  lispro) 40-60 units twice daily prior to meals.   Patient reports average glucose readings are now ~ 150mg /dl Lowest glucose reading 111 mg/dl Denies hypoglycemic symptoms.   Patient was denied dose increase due to insurance issues  Also inquired about CGM interest - patient remains uninterested in CGM.    [facility]  Pharmacist shared that a dose of 7.5mg  may have been denied at that store due to them not having a prescription for 5mg  in the system 5mg  dose was filled at [facility] in [place].  Pharmacist made note of this and believe the higher dose will now be OK for processing.   Medication Plan: -Increase dose Mounjaro  from 5mg  weekly to 7.5mg  weekly.  - Decrease Tresiba  (insulin  degludec) 40 units BID - Decrease Humalog  (insulin  lispro) 40-50 units twice daily prior to meals.  New prescription provided for Mounjaro      Plan to decrease both basal and bolus insulin  when increasing Mounjaro .    Attempted to contact patient. - Left message to return call to discuss changes.   Left HIPAA compliant voice mail requesting call back to direct phone: [phone]  Total time with patient call and documentation of interaction: 28 minutes.

## 2023-04-04 NOTE — Assessment & Plan Note (Signed)
 Patient contacted for follow-up of glycemic control.   Determined current diabetes drug regimen:  Mounjaro  (tirzepatice) 5mg  - last dose of 4 was last Tuesday. Tresiba  (insulin  degludec) 45 units BID Humalog  (insulin  lispro) 40-60 units twice daily prior to meals.   Patient reports average glucose readings are now ~ 150mg /dl Lowest glucose reading 111 mg/dl Denies hypoglycemic symptoms.   Patient was denied dose increase due to insurance issues  Also inquired about CGM interest - patient remains uninterested in CGM.    [facility]  [facility]  Pharmacist shared that a dose of 7.5mg  may have been denied at that store due to them not having a prescription for 5mg  in the system 5mg  dose was filled at [facility] in [place].  Pharmacist made note of this and believe the higher dose will now be OK for processing.   Medication Plan: -Increase dose Mounjaro  from 5mg  weekly to 7.5mg  weekly.  - Decrease Tresiba  (insulin  degludec) 40 units BID - Decrease Humalog  (insulin  lispro) 40-50 units twice daily prior to meals.  New prescription provided for Mounjaro 

## 2023-04-05 NOTE — Telephone Encounter (Signed)
 Reviewed and agree with Dr Macky Lower plan.

## 2023-04-12 ENCOUNTER — Encounter: Payer: Self-pay | Admitting: Family Medicine

## 2023-04-13 NOTE — Telephone Encounter (Signed)
Please call and offer visit ASAP. Need renal function checked. Please ask to bring ALL medications to visit. Visit should be this week- can be any provider.  Terisa Starr, MD  Family Medicine Teaching Service

## 2023-05-08 ENCOUNTER — Encounter: Payer: Self-pay | Admitting: Family Medicine

## 2023-05-08 DIAGNOSIS — E1159 Type 2 diabetes mellitus with other circulatory complications: Secondary | ICD-10-CM

## 2023-05-08 DIAGNOSIS — Z8673 Personal history of transient ischemic attack (TIA), and cerebral infarction without residual deficits: Secondary | ICD-10-CM

## 2023-05-09 NOTE — Telephone Encounter (Signed)
 Patient has need for DME. I have ordered  shower chair  . I am routing note for [facility] RN Pool.   Westley Chandler, MD

## 2023-05-20 NOTE — Progress Notes (Signed)
    SUBJECTIVE:   CHIEF COMPLAINT: shower chair, diabetes HPI:   Amber [name] is a 53 y.o.  with history notable for CVA, depression, obesity, and type 2 DM presenting for check up.   Diabetes.  She reports compliance with her medications.  Her glucoses have been improved.  She is down to 20 units of her insulin.  She is really working on portion control in her diet.  She does report some nausea and belching with the increased dose of Mounjaro.  She tolerated the 5 mg dose well.  Her weight is down significantly.  She denies vomiting, diarrhea or dizziness.  She is up-to-date on her eye exam.  She is amenable to urine test today.  [facility]  The patient has a history of stroke.  She has had falls in the past.  No recent falls.  Her gait is unstable due to ataxia and vision impairment.  She has difficulty transferring to the shower.  She currently uses a single prong cane but finds it to be unstable and not helpful. PERTINENT  PMH / PSH/Family/Social History : History of stroke, diabetes, hypertension  Recently moved to [place] is at ALF No longer with Bec--single and lives with Pip (Corgi mix girl)   OBJECTIVE:   BP (!) 158/81   Pulse (!) 111   Ht 5\' 6"  (1.676 m)   Wt 277 lb 3.2 oz (125.7 kg)   LMP 04/19/2021 (Approximate)   SpO2 98%   BMI 44.74 kg/m   Today's weight:  Last Weight  Most recent update: 05/23/2023  9:21 AM    Weight  125.7 kg (277 lb 3.2 oz)            Review of prior weights: [employer]   05/23/23 0921  Weight: 277 lb 3.2 oz (125.7 kg)     Cardiac: Regular rate and rhythm. Normal S1/S2. No murmurs, rubs, or gallops appreciated. Lungs: Clear bilaterally to ascultation.  Psych: Pleasant and appropriate    ASSESSMENT/PLAN:   Assessment & Plan Type 2 diabetes mellitus with other circulatory complication, without long-term current use of insulin (HCC) A1C improved but not at goal Patient working on lifestyle  changes Reduce Mounjaro to 5 mg May need to increase insulin  Follow up 1 month to review CBG  UACR today  History of CVA (cerebrovascular accident) At risk for falls Transfer shower chair and 4 prong cane Hyperlipidemia, unspecified hyperlipidemia type Lipids at next visit  Morbid obesity (HCC) Congratulated on changes! Primary hypertension Not at goal Increase Valsartan hydrochlorothiazide from 80 -25 to 160 mg -25 mg CMA called to cancel Rx  HCM  Declined CRC screening today--maybe at next visit she states   Terisa Starr, MD  Family Medicine Teaching Service  [facility]

## 2023-05-23 ENCOUNTER — Ambulatory Visit (INDEPENDENT_AMBULATORY_CARE_PROVIDER_SITE_OTHER): Payer: 59 | Admitting: Family Medicine

## 2023-05-23 ENCOUNTER — Telehealth: Payer: Self-pay | Admitting: Family Medicine

## 2023-05-23 ENCOUNTER — Encounter: Payer: Self-pay | Admitting: Family Medicine

## 2023-05-23 VITALS — BP 158/81 | HR 111 | Ht 66.0 in | Wt 277.2 lb

## 2023-05-23 DIAGNOSIS — E785 Hyperlipidemia, unspecified: Secondary | ICD-10-CM

## 2023-05-23 DIAGNOSIS — Z8673 Personal history of transient ischemic attack (TIA), and cerebral infarction without residual deficits: Secondary | ICD-10-CM

## 2023-05-23 DIAGNOSIS — E1159 Type 2 diabetes mellitus with other circulatory complications: Secondary | ICD-10-CM

## 2023-05-23 DIAGNOSIS — I10 Essential (primary) hypertension: Secondary | ICD-10-CM

## 2023-05-23 LAB — POCT GLYCOSYLATED HEMOGLOBIN (HGB A1C): HbA1c, POC (controlled diabetic range): 10 % — AB (ref 0.0–7.0)

## 2023-05-23 MED ORDER — TIRZEPATIDE 5 MG/0.5ML ~~LOC~~ SOAJ: 5.0000 mg | SUBCUTANEOUS | 1 refills | Status: DC

## 2023-05-23 NOTE — Assessment & Plan Note (Signed)
 Congratulated on changes!

## 2023-05-23 NOTE — Assessment & Plan Note (Signed)
 At risk for falls Transfer shower chair and 4 prong cane

## 2023-05-23 NOTE — Assessment & Plan Note (Signed)
 Lipids at next visit

## 2023-05-23 NOTE — Assessment & Plan Note (Signed)
 A1C improved but not at goal Patient working on lifestyle changes Reduce Mounjaro to 5 mg May need to increase insulin  Follow up 1 month to review CBG  UACR today

## 2023-05-23 NOTE — Assessment & Plan Note (Signed)
 Not at goal Increase Valsartan hydrochlorothiazide from 80 -25 to 160 mg -25 mg CMA called to cancel Rx

## 2023-05-23 NOTE — Telephone Encounter (Signed)
Message sent to [employer] for processing.  

## 2023-05-23 NOTE — Patient Instructions (Signed)
 It was wonderful to see you today.  Please bring ALL of your medications with you to every visit.   Today we talked about:   Your A1C is improving at 10.0  I sent in Mounjaro 5 mg Please stop the 7.5 mg dose  CONGRATULATIONS on lifestyles changes  I will message with results    Please follow up in 1 months   Thank you for choosing [facility].   Please call [phone] with any questions about today's appointment.  Please be sure to schedule follow up at the front  desk before you leave today.   Terisa Starr, MD  Family Medicine

## 2023-05-23 NOTE — Telephone Encounter (Signed)
 Confirmation received per [employer].

## 2023-05-23 NOTE — Telephone Encounter (Signed)
 Patient has need for DME. I have ordered  cane and shower bench . I am routing note for [facility] RN Pool.   Westley Chandler, MD

## 2023-05-24 ENCOUNTER — Encounter: Payer: Self-pay | Admitting: Family Medicine

## 2023-05-24 NOTE — Telephone Encounter (Signed)
 RN team--the cane that was ordered specified 4 prong. Please see order

## 2023-05-24 NOTE — Telephone Encounter (Signed)
 Community message sent to [employer]. Will await response.   Veronda Prude, RN

## 2023-05-25 LAB — MICROALBUMIN / CREATININE URINE RATIO
Creatinine, Urine: 529.2 mg/dL
Microalb/Creat Ratio: 184 mg/g{creat} — ABNORMAL HIGH (ref 0–29)
Microalbumin, Urine: 973.7 ug/mL

## 2023-05-26 ENCOUNTER — Encounter: Payer: Self-pay | Admitting: Family Medicine

## 2023-05-27 ENCOUNTER — Ambulatory Visit: Payer: Self-pay

## 2023-06-03 ENCOUNTER — Encounter: Payer: Self-pay | Admitting: Family Medicine

## 2023-06-06 MED ORDER — MOUNJARO 2.5 MG/0.5ML ~~LOC~~ SOAJ: 2.5000 mg | SUBCUTANEOUS | 3 refills | Status: DC

## 2023-06-13 ENCOUNTER — Telehealth: Payer: Self-pay

## 2023-06-13 NOTE — Telephone Encounter (Signed)
 Pharmacy Patient Advocate Encounter  Received notification from [facility] that Prior Authorization for [facility] has been APPROVED from 06/13/23 to 06/13/26

## 2023-06-13 NOTE — Telephone Encounter (Signed)
 Pharmacy Patient Advocate Encounter   Received notification from CoverMyMeds that prior authorization for [facility] is required/requested.   Insurance verification completed.   The patient is insured through [facility] .   PA required; PA submitted to above mentioned insurance via CoverMyMeds Key/confirmation #/EOC [id number]. Status is pending

## 2023-06-15 ENCOUNTER — Telehealth: Payer: Self-pay | Admitting: Pharmacist

## 2023-06-15 NOTE — Telephone Encounter (Signed)
 Reviewed and agree with Dr Macky Lower plan.

## 2023-06-15 NOTE — Telephone Encounter (Signed)
 Insurance issue with Amber [name] (tirzepatide) - drug is approved however, the cost of the drug is higher than anticipated co-pay.  Likely, her deductible is cause for elevated cost.  She could also have a cost for 3 month supply being quoted.    Left Message for patient.  Asked to return call to discuss medication access issues.  Plan to discuss deductible amount and option of samples.    Total time with patient call and documentation of interaction: 11 minutes.

## 2023-06-17 ENCOUNTER — Encounter: Payer: Self-pay | Admitting: Family Medicine

## 2023-06-28 ENCOUNTER — Encounter: Payer: Self-pay | Admitting: Family Medicine

## 2023-06-28 IMAGING — CT CT ANGIO HEAD-NECK (W OR W/O PERF)
1 of 8 series · 14 of 47 positions shown · IV contrast (omnipaque)
Comparison: None.

CLINICAL DATA: Right-sided weakness.

EXAM:
CT ANGIOGRAPHY HEAD AND NECK
TECHNIQUE: Multidetector CT imaging of the head and neck was performed using
the standard protocol during bolus administration of intravenous
contrast. Multiplanar CT image reconstructions and MIPs were
obtained to evaluate the vascular anatomy. Carotid stenosis
measurements (when applicable) are obtained utilizing NASCET
criteria, using the distal internal carotid diameter as the
denominator.
CONTRAST:  75mL OMNIPAQUE IOHEXOL 350 MG/ML SOLN

[Series 12: thin · axial · 0.59mm/px · z∈[-272,+46]mm · 14 of 734 slices shown]
[im 49/734  brain]
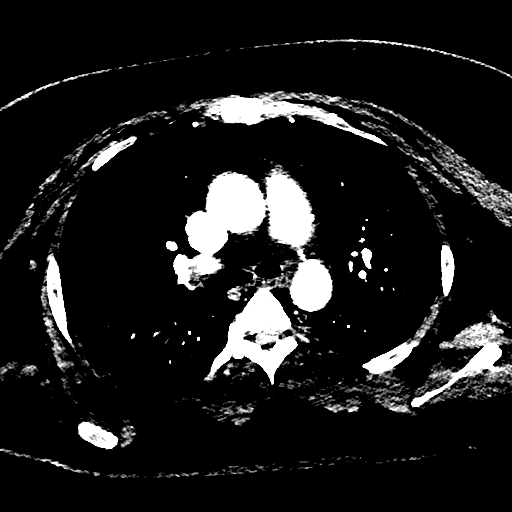
[im 98/734  bone]
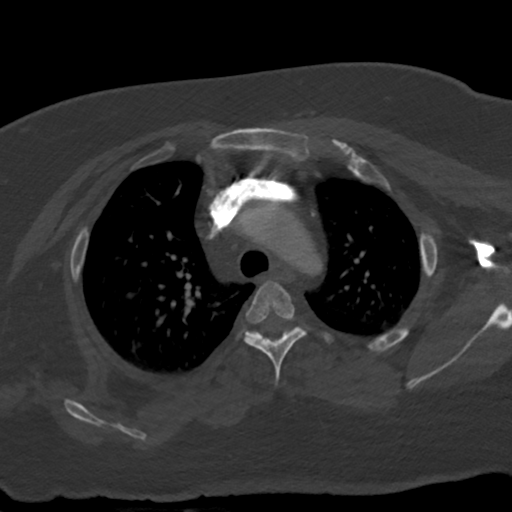
[im 147/734  brain]
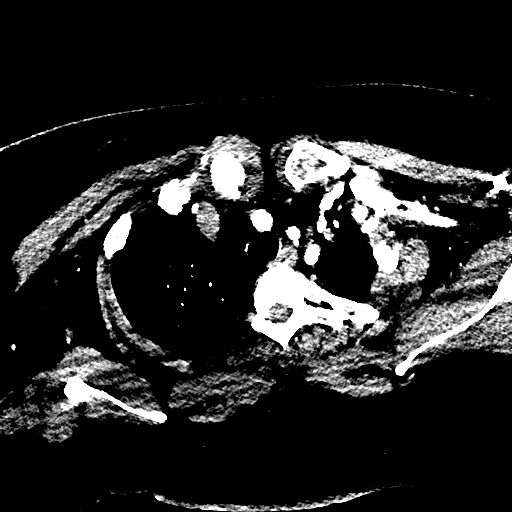
[im 196/734  bone]
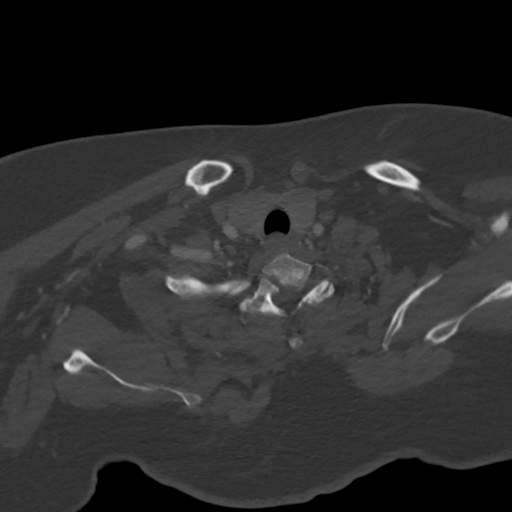
[im 245/734  brain]
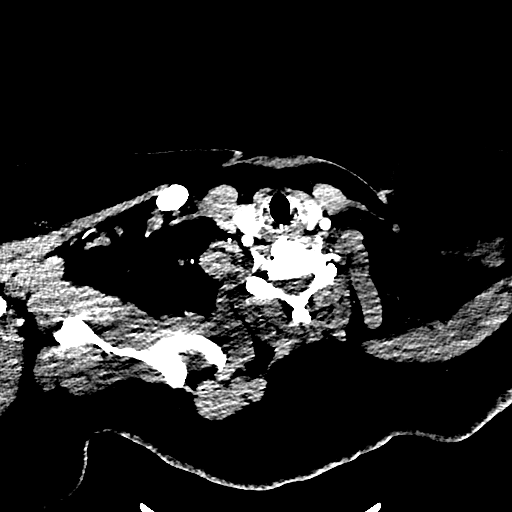
[im 294/734  bone]
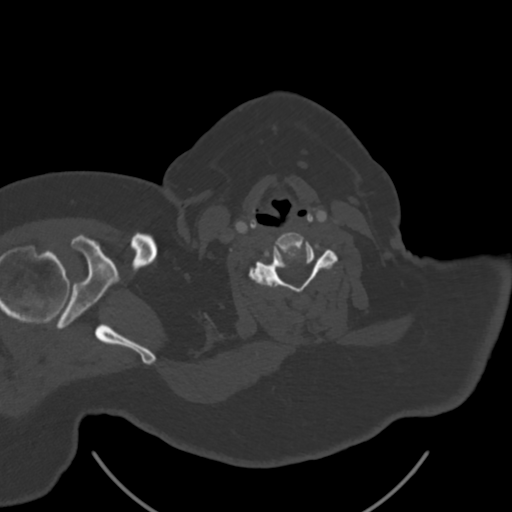
[im 343/734  brain]
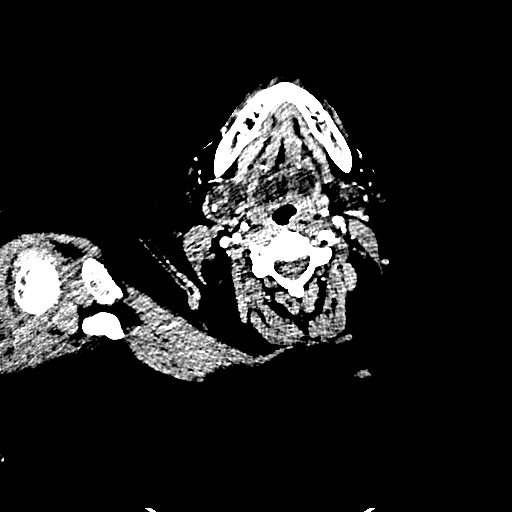
[im 391/734  bone]
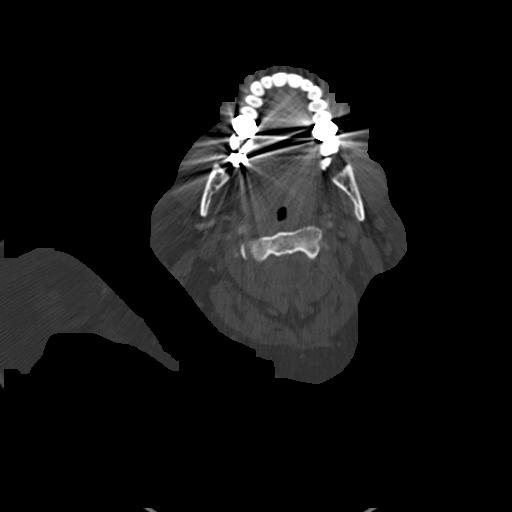
[im 440/734  brain]
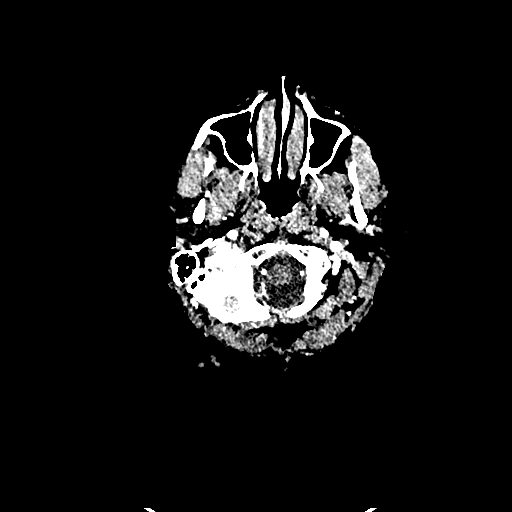
[im 489/734  bone]
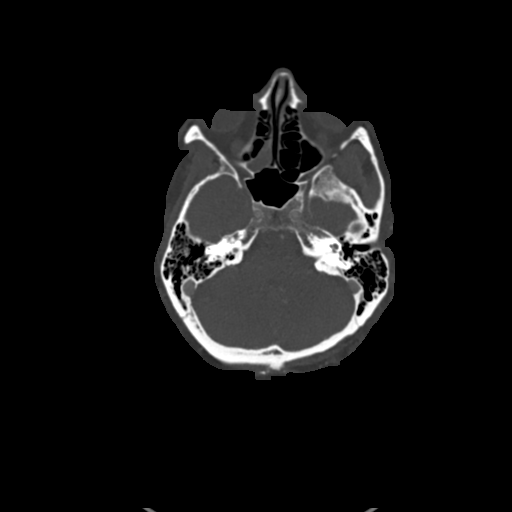
[im 538/734  brain]
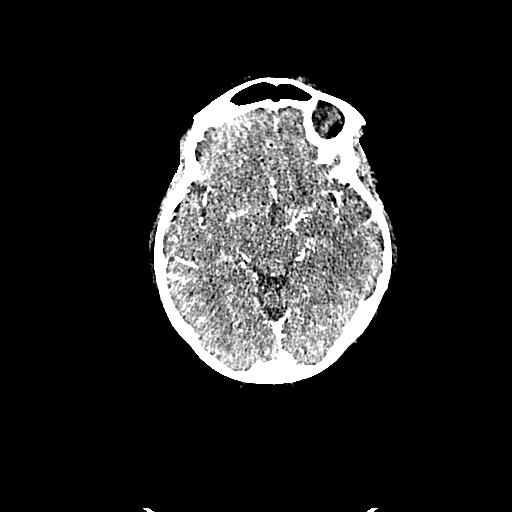
[im 587/734  bone]
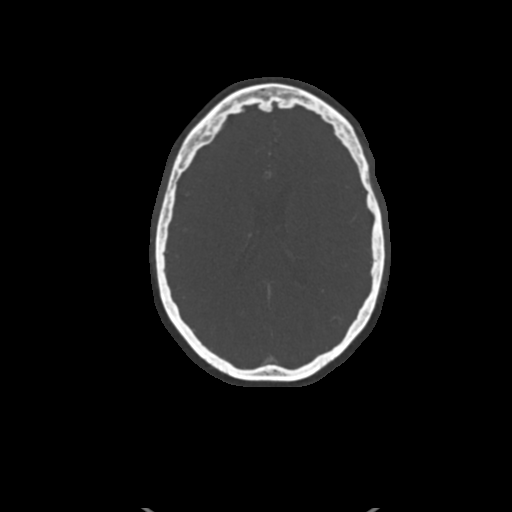
[im 636/734  brain]
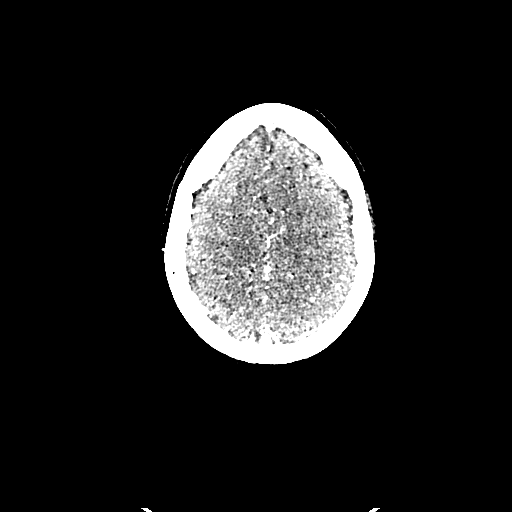
[im 685/734  bone]
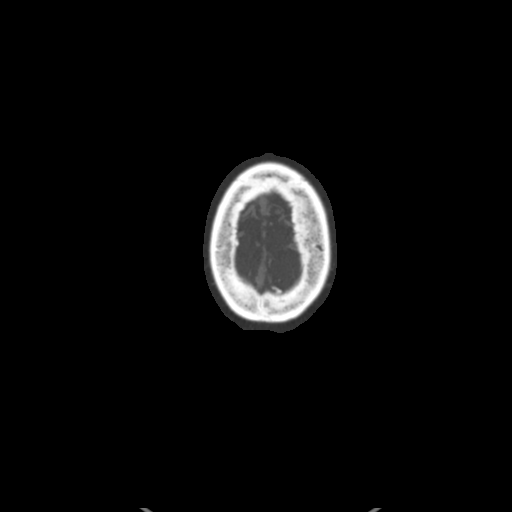

[14 of 47 positions shown; findings below may reference images not displayed]

FINDINGS: CTA NECK FINDINGS

Aortic arch: Normal variant 4 vessel aortic arch with the left
vertebral artery arising from the arch. Widely patent
brachiocephalic and subclavian arteries.

Right carotid system: Patent with a small amount of calcified plaque
in the carotid bulb. No evidence of a significant stenosis or
dissection.

Left carotid system: Patent with a small to moderate amount of
calcified and soft plaque at the carotid bifurcation. No evidence of
a significant stenosis or dissection.

Vertebral arteries: Patent and small bilaterally with the left being
particularly hypoplastic. No evidence of a significant stenosis or
dissection.

Skeleton: Mild-to-moderate disc and moderate facet degeneration in
the cervical spine.

Other neck: No evidence of cervical lymphadenopathy or mass.

Upper chest: Clear lung apices.

Review of the MIP images confirms the above findings

CTA HEAD FINDINGS

Anterior circulation: The internal carotid arteries are patent from
skull base to carotid termini with mild atherosclerotic irregularity
but no significant stenosis. ACAs and MCAs are patent with
mild-to-moderate branch vessel irregularity but no evidence of a
proximal branch occlusion or significant proximal stenosis. No
aneurysm is identified.

Posterior circulation: The intracranial vertebral arteries are
patent with the left ending in PICA. The basilar artery is patent
and congenitally small in caliber with diffuse irregularity as well
as a moderate focal stenosis in its midportion. There are fetal
origins of both PCAs. Both PCAs are patent with diffuse
irregularity. There is a severe proximal right P2 stenosis. No
aneurysm is identified.

Venous sinuses: Patent.

Anatomic variants: Fetal PCAs.

Review of the MIP images confirms the above findings
IMPRESSION: 1. No emergent large vessel occlusion.
2. Intracranial atherosclerosis including severe proximal right P2
and moderate basilar artery stenoses.
3. Cervical carotid atherosclerosis without stenosis.

These results were communicated to Dr. Bojang at [DATE] on
09/24/2020 by text page via the [employer] messaging system.

## 2023-06-29 NOTE — Telephone Encounter (Signed)
 Noted in patient's chart.

## 2023-07-07 ENCOUNTER — Other Ambulatory Visit: Payer: Self-pay | Admitting: *Deleted

## 2023-07-07 ENCOUNTER — Encounter: Payer: Self-pay | Admitting: Family Medicine

## 2023-07-07 DIAGNOSIS — R053 Chronic cough: Secondary | ICD-10-CM

## 2023-07-07 MED ORDER — FLUTICASONE PROPIONATE 50 MCG/ACT NA SUSP: 2.0000 | Freq: Every day | NASAL | 6 refills | Status: AC

## 2023-08-12 ENCOUNTER — Ambulatory Visit: Attending: Family Medicine | Admitting: Physical Therapy

## 2023-08-12 DIAGNOSIS — R2681 Unsteadiness on feet: Secondary | ICD-10-CM | POA: Insufficient documentation

## 2023-08-12 DIAGNOSIS — Z8673 Personal history of transient ischemic attack (TIA), and cerebral infarction without residual deficits: Secondary | ICD-10-CM | POA: Diagnosis not present

## 2023-08-12 DIAGNOSIS — E1159 Type 2 diabetes mellitus with other circulatory complications: Secondary | ICD-10-CM | POA: Diagnosis not present

## 2023-08-12 DIAGNOSIS — R2689 Other abnormalities of gait and mobility: Secondary | ICD-10-CM | POA: Insufficient documentation

## 2023-08-12 DIAGNOSIS — M6281 Muscle weakness (generalized): Secondary | ICD-10-CM | POA: Diagnosis present

## 2023-08-12 DIAGNOSIS — R296 Repeated falls: Secondary | ICD-10-CM | POA: Diagnosis not present

## 2023-08-12 NOTE — Therapy (Signed)
 OUTPATIENT PHYSICAL THERAPY WHEELCHAIR EVALUATION   Patient Name: Amber [name] MRN: [id number] DOB:1971/02/27, 53 y.o., female Today's Date: 08/12/2023  END OF SESSION:  PT End of Session - 08/12/23 0932     Visit Number 1    Number of Visits 1    Date for PT Re-Evaluation 08/12/23    Authorization Type [employer]    PT Start Time 0930    PT Stop Time 1024    PT Time Calculation (min) 54 min    Activity Tolerance Patient tolerated treatment well    Behavior During Therapy WFL for tasks assessed/performed             Past Medical History:  Diagnosis Date   Cerebrovascular disease    Depression    DM2 (diabetes mellitus, type 2) (HCC)    HLD (hyperlipidemia)    HTN (hypertension)    Past Surgical History:  Procedure Laterality Date   right eye surgery Right    had retinal hemorrhage   Patient Active Problem List   Diagnosis Date Noted   Pedal edema 01/25/2023   OSA (obstructive sleep apnea) 03/30/2021   Morbid obesity (HCC) 12/29/2020   History of CVA (cerebrovascular accident) 10/01/2020   HTN (hypertension)    DM2 (diabetes mellitus, type 2) (HCC)    HLD (hyperlipidemia)    Depression     PCP: Azell Boll, MD  REFERRING PROVIDER: Azell Boll, MD  THERAPY DIAG:  Muscle weakness (generalized)  Other abnormalities of gait and mobility  Unsteadiness on feet  Rationale for Evaluation and Treatment Habilitation  SUBJECTIVE:                                                                                                                                                                                           SUBJECTIVE STATEMENT: Pt presents for new power wheelchair evaluation. She presents to the clinic with a SBQC. 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 She reports that she has decreased energy as the day progresses and has frequent falls, majority of her falls are towards the end of the day. She can only walk about 25-30  feet with her [facility] before needing to take a rest break. Pt reports having difficulty reaching up into her cabinets.   PRECAUTIONS: Fall   RED FLAGS: None   WEIGHT BEARING RESTRICTIONS No   OCCUPATION: disability  PLOF:  Independent with gait, Independent with transfers, Requires assistive device for independence, and Needs assistance with ADLs  PATIENT GOALS: to obtain power wheeled mobility to allow for safe and independent mobility and ability to complete MRADLs in  a safe and timely manner           MEDICAL HISTORY:  Primary diagnosis onset: 10/01/2020     Medical Diagnosis with ICD-10 code: History of CVA Z86.73   [] Progressive disease  Relevant future surgeries: N/A    Height: 5'6" Weight: 275 lbs Explain recent changes or trends in weight:  N/A    History:  Past Medical History:  Diagnosis Date   Cerebrovascular disease    Depression    DM2 (diabetes mellitus, type 2) (HCC)    HLD (hyperlipidemia)    HTN (hypertension)        Cardio Status:  Functional Limitations: impaired due to limited functional mobility  [] Intact  [x]  Impaired      Respiratory Status:  Functional Limitations: impaired due to limited functional mobility  [] Intact  [x] Impaired   [] SOB [] COPD [] O2 Dependent ______LPM  [] Ventilator Dependent  Resp equip:                                                     Objective Measure(s):   Orthotics:   [] Amputee:                                                             [] Prosthesis:        HOME ENVIRONMENT:  [] House [] Condo/town home [x] Apartment [] Asst living [] LTCF         [] Own  [x] Rent   [x] Lives alone [] Lives with others -                             Hours without assistance: 24 hours/day  [x] Home is accessible to patient                                 Storage of wheelchair:  [x] In home   [] Other Comments:        COMMUNITY :  TRANSPORTATION:  [x] Car [] Tour manager [] Adapted w/c Lift []  Ambulance [] Other:                      [] Sits in wheelchair during transport   Where is w/c stored during transport? Will be store in trunk of car [] [place] [place]  []  EZ Lock  r   [] Self-Driver       Drive while in  Youth worker yes [x] no   Employment and/or school: N/A Specific requirements pertaining to mobility        Other:  COMMUNICATION:  Verbal Communication  [x] WFL [] receptive [x] WFL [] expressive [] Understandable  [] Difficult to understand  [] non-communicative  Primary Language:_____English________ 2nd:_____________  Communication provided by:[x] Patient [] Family [] Caregiver [] Translator   [] Uses an augmentative communication device     Manufacturer/Model :  MOBILITY/BALANCE:  Sitting Balance  Standing Balance  Transfers  Ambulation   [x] WFL      [] WFL  [x] Independent  []  Independent   [] Uses UE for balance in sitting Comments:  [x] Uses UE/device for stability Comments: uses SBQC or countertop []  Min assist  []  Ambulates independently with       device:___________________      []  Mod assist  []  Able to ambulate ______ feet        safely/functionally/independently   []  Min assist  []  Min assist  []  Max assist  [x]  Non-functional ambulator         History/High risk of falls   []  Mod assist  []  Mod assist  []  Dependent  []  Unable to ambulate   []  Max  assist  []  Max assist  Transfer method:[] 1 person [] 2 person [] sliding board [] squat pivot [] stand pivot [] mechanical patient lift  [] other:   []  Unable  []  Unable    Fall History: # of falls in the past 6 months? Pt falls a couple times a month (so at least 12 falls), falls in afternoon to evenings when fatigued and it takes more effort to pick up her RLE; has to crawl to couch to pull herself back up; no injuries with these falls so far # of "near" falls in the past 6 months? 3 near falls per week (so at least 72 near falls)    CURRENT SEATING / MOBILITY:  Current Mobility Device: [] None [x] Cane/Walker  [] Manual [] Dependent [] Dependent w/ Tilt rScooter  [] Power (type of control):   Manufacturer:  Model:  Serial #:   Size:  Color:  Age:   Purchased by whom: patient  Current condition of mobility base:  N/A  Current seating system:  N/A                                                                  Age of seating system:  N/A  Describe posture in present seating system: N/A   Is the current mobility meeting medical necessity?:  [] Yes [x] No Describe: Patient currently only uses a SBQC for all of her mobility, however as noted above she is having a fall at least 2 times per month and having near falls at least 3 times per week. Her SBQC is not meeting her needs due to her having impaired balance, impaired LE strength, impaired LE sensation, and impaired endurance leading to her falling so frequently. Additionally, she is only able to walk about 25-30 ft at a time with her [facility] which severely limits her functional mobility and ability to complete her MRADLs in the home.                                    Ability to complete Mobility-Related Activities of Daily Living (MRADL's) with Current Mobility Device:   Move room to room  [x] Independent  [] Min [] Mod [] Max assist  [] Unable  Comments: 15 min segments before onset of fatigue  Occasional bladder incontincne due to being unable to get to the bathroom in time  Meal prep  [] Independent  [x] Min [] Mod [] Max assist  [] Unable    Feeding  [x] Independent  [] Min [] Mod [] Max assist  [] Unable  Bathing  [] Independent  [x] Min [] Mod [] Max assist  [] Unable    Grooming  [] Independent  [x] Min [] Mod [] Max assist  [] Unable    UE dressing  [] Independent  [x] Min [] Mod [] Max assist  [] Unable    LE dressing  [] Independent   [x] Min [] Mod [] Max assist  [] Unable    Toileting  [] Independent  [x] Min [] Mod [] Max assist  [] Unable    Bowel Mgt: []  Continent []  Incontinent [x]  Accidents []  Diapers []  Colostomy []  Bowel Program:  Bladder Mgt: []  Continent []  Incontinent  [x]  Accidents []  Diapers []  Urinal []  Intermittent Cath []  Indwelling Cath []  Supra-pubic Cath     Current Mobility Equipment Trialed/ Ruled Out:    Does not meet mobility needs due to:    Mark all boxes that indicate inability to use the specific equipment listed     Meets needs for safe  independent functional  ambulation  / mobility    Risk of  Falling or History of Falls    Enviromental limitations      Cognition    Safety concerns with  physical ability    Decreased / limitations endurance  & strength     Decreased / limitations  motor skills  & coordination    Pain    Pace /  Speed    Cardiac and/or  respiratory condition    Contra - indicated by diagnosis   Cane/Crutches  []   [x]   []   []   [x]   [x]   [x]   []   [x]   []   []    Walker / Rollator  []  NA   []   [x]   []   []   [x]   [x]   [x]   []   [x]   []   []     Manual Wheelchair [id number]:  []  NA  []   []   []   []   [x]   [x]   [x]   []   [x]   []   []    Manual W/C (K0005) with power assist  []  NA  []   []   []   []   [x]   [x]   [x]   []   [x]   []   []    Scooter  []  NA  []   [x]   []   []   [x]   [x]   [x]   []   []   []   []    Power Wheelchair: standard joystick  []  NA  [x]   []   []   []   []   []   []   []   []   []   []    Power Wheelchair: alternative controls  [x]  NA  []   []   []   []   []   []   []   []   []   []   []    Summary:  The least costly alternative for independent functional mobility was found to be:    []  Crutch/Cane  []  Walker []  Manual w/c  []  Manual w/c with power assist   []  Scooter   [x]  Power w/c std joystick   []  Power w/c alternative control        []  Requires dependent care mobility Market researcher for [employer] skills are adequate for safe mobility equipment operation  [x]   Yes []   No  Patient is willing and motivated to use recommended mobility equipment  [x]   Yes []   No       []  Patient is unable to safely operate mobility equipment independently and requires dependent care equipment Comments:            SENSATION and SKIN ISSUES:  Sensation []  Intact  [x]  Impaired []   Absent []  Hyposensate []  Hypersensate  []  Defensiveness  Location(s) of impairment: decreased light touch sensation in RLE as compared to LLE   Pressure Relief Method(s):  []  Lean side to side to offload (without risk of falling)  []   W/C push up (4+ times/hour for 15+ seconds) [x]  Stand up (without risk of falling)    []  Other: (Describe): Effective pressure relief method(s) above can be performed consistently throughout the day: [x] Yes  []  No If not, Why?:  Skin Integrity Risk:       [x]  Low risk           []  Moderate risk            []  High risk  If high risk, explain:   Skin Issues/Skin Integrity  Current skin Issues  []  Yes [x]  No []  Intact  []   Red area   []   Open area  []  Scar tissue  []  At risk from prolonged sitting  Where: History of Skin Issues  []  Yes [x]  No Where : When: Stage: Hx of skin flap surgeries  []  Yes [x]  No Where:  When:  Pain: []  Yes [x]  No   Pain Location(s):  Intensity scale: (0-10) : How does pain interfere with mobility and/or MRADLs? -         MAT EVALUATION:  Neuro-Muscular Status: (Tone, Reflexive, Responses, etc.)     []   Intact   [x]  Spasticity: in BLE, increases more at the end of the day []  Hypotonicity  []  Fluctuating  []  Muscle Spasms  []  Poor Righting Reactions/Poor Equilibrium Reactions  []  Primal Reflex(s):    Comments:            COMMENTS:    POSTURE:     Comments:  Pelvis Anterior/Posterior:  []  Neutral   [x]  Posterior  []  Anterior  []  Fixed - No movement [x]  Tendency away from neutral []  Flexible []  Self-correction []  External correction Obliquity (viewed from front)  [x]  WFL []  R Obliquity []  L Obliquity  []  Fixed - No movement []  Tendency away from neutral []  Flexible []  Self-correction []  External correction Rotation  [x]  WFL []  R anterior []  L anterior  []  Fixed - No movement []  Tendency away from neutral []   Flexible []  Self-correction []  External correction Tonal Influence Pelvis:  [x]  Normal []  Flaccid []  Low tone []  Spasticity []  Dystonia []  Pelvis thrust []  Other:    Trunk Anterior/Posterior:  []  WFL [x]  Thoracic kyphosis []  Lumbar lordosis  []  Fixed - No movement [x]  Tendency away from neutral []  Flexible []  Self-correction []  External correction  [x]  WFL []  Convex to left  []  Convex to right []  S-curve   []  C-curve []  Multiple curves []  Tendency away from neutral []  Flexible []  Self-correction []  External correction Rotation of shoulders and upper trunk:  [x]  Neutral []  Left-anterior []  Right- anterior []  Fixed- no movement []  Tendency away from neutral []  Flexible []  Self correction []  External correction Tonal influence Trunk:  [x]  Normal []  Flaccid []  Low tone []  Spasticity []  Dystonia []  Other:   Head & Neck  [x]  Functional []  Flexed    []  Extended []  Rotated right  []  Rotated left []  Laterally flexed right []  Laterally flexed left []  Cervical hyperextension   [x]  Good head control []  Adequate head control []  Limited head control []  Absent head control Describe tone/movement of head and neck: [facility]     Lower Extremity Measurements: LE ROM:  Active ROM Right 08/12/2023 Left 08/12/2023  Hip  flexion [facility]  Hip extension    Hip abduction    Hip adduction    Knee flexion    Knee extension    Ankle dorsiflexion    Ankle plantarflexion     (Blank rows = not tested)  LE MMT:  MMT Right 08/12/2023 Left 08/12/2023  Hip flexion 3 5  Hip extension    Hip abduction    Hip adduction    Knee flexion 3 5  Knee extension 3 5  Ankle dorsiflexion 3 5  Ankle plantarflexion     (Blank rows = not tested)  Hip positions:  [x]  Neutral   []  Abducted   []  Adducted  []  Subluxed   []  Dislocated   []  Fixed   []  Tendency away from neutral []  Flexible []  Self-correction []  External correction   Hip Windswept:[x]  Neutral  []  Right    []  Left   []  Subluxed   []  Dislocated   []  Fixed   []  Tendency away from neutral []  Flexible []  Self-correction []  External correction  LE Tone: [x]  Normal []  Low tone []  Spasticity []  Flaccid []  Dystonia []  Rocks/Extends at hip []  Thrust into knee extension []  Pushes legs downward into footrest  Foot positioning: ROM Concerns: Dorsiflexed: []  Right   []  Left Plantar flexed: []  Right    []  Left Inversion: []  Right    []  Left Eversion: []  Right    []  Left  LE Edema: [x]  1+ (Barely detectable impression when finger is pressed into skin) []  2+ (slight indentation. 15 seconds to rebound) []  3+ (deeper indentation. 30 seconds to rebound) []  4+ (>30 seconds to rebound)  UE Measurements:  UPPER EXTREMITY ROM:   Active ROM Right 08/12/2023 Left 08/12/2023  Shoulder flexion [facility]  Shoulder abduction [facility]  Shoulder adduction    Elbow flexion [facility] WFL  Elbow extension [facility] WFL  Wrist flexion    Wrist extension    (Blank rows = not tested)  UPPER EXTREMITY MMT:  MMT Right 08/12/2023 Left 08/12/2023  Shoulder flexion 3 5  Shoulder abduction 3 5  Shoulder adduction    Elbow flexion 3 5  Elbow extension 3 5  Wrist flexion    Wrist extension    Pinch strength    Grip strength decreased WFL  (Blank rows = not tested)  Shoulder Posture:  Right Tendency towards Left  []   Functional []    []   Elevation []    [x]   Depression [x]    [x]   Protraction [x]    []   Retraction []    []   Internal rotation []    []   External rotation []    []   Subluxed []     UE Tone: [x]  Normal []  Flaccid []  Low tone []  Spasticity  []  Dystonia []  Other:   UE Edema: []  1+ (Barely detectable impression when finger is pressed into skin) []  2+ (slight indentation. 15 seconds to rebound) []  3+ (deeper indentation. 30 seconds to rebound) []  4+ (>30 seconds to rebound)  Wrist/Hand: Handedness: [x]  Right   []  Left   []  NA: Comments:  Right  Left  []   WNL [x]    [x]   Limitations []    []    Contractures []    []   Fisting []    []   Tremors []    [x]   Weak grasp []    [x]   Poor dexterity []    []   Hand movement non functional []    []   Paralysis []         MOBILITY BASE RECOMMENDATIONS and JUSTIFICATION:  MOBILITY BASE  JUSTIFICATION   Manufacturer:   Pride Model:                 Go Chair MED             Color:  Seat Width:  20" Seat Depth: 18"   []  Manual mobility base (continue below)   []  Scooter/POV  [x]  Power mobility base   Number of hours per day spent in above selected mobility base: 6 hours/day   Typical daily mobility base use Schedule: Patient will utilize her power wheelchair to perform all of her functional mobility in the home. It will allow her to travel between rooms of her home safely and efficiently in order to complete all of her MRADLs putting her at less risk of having a fall. Pt is able to perform stand pivot transfers mod I with her SBQC . Due to impaired endurance and R hemiparesis she is also unable to safely and efficiently propel herself in a manual wheelchair. It will also allow for energy conservation and decrease her risk for falls when she does stand to transfer to complete ADLs.   [x]  is not a safe, functional ambulator  []  limitation prevents from completing a MRADL(s) within a reasonable time frame    [x]  limitation places at high risk of morbidity or mortality secondary to  the attempts to perform a    MRADL(s)  []  limitation prevents accomplishing a MRADL(s) entirely  [x]  provide independent mobility  [x]  equipment is a lifetime medical need  [x]  walker or cane inadequate  [x]  any type manual wheelchair      inadequate  [x]  scooter/POV inadequate      []  requires dependent mobility         POWER MOBILITY      []  Scooter/POV    []  can safely operate   []  can safely transfer   []  has adequate trunk stability   []  cannot functionally propel  manual wheelchair    [x]  Power mobility base    []  non-ambulatory   [x]  cannot  functionally propel manual wheelchair   [x]  cannot functionally and safely      operate scooter/POV  [x]  can safely operate power       wheelchair  [x]  home is accessible  [x]  willing to use power wheelchair     Tilt  []  Powered tilt on powered chair  []  Powered tilt on manual chair  []  Manual tilt on manual chair Comments:  []  change position for pressure      []  elief/cannot weight shift   []  change position against      gravitational force on head and      shoulders   []  decrease pain  []  blood pressure management   []  control autonomic dysreflexia  []  decrease respiratory distress  []  management of spasticity  []  management of low tone  []  facilitate postural control   []  rest periods   []  control edema  []  increase sitting tolerance   []  aid with transfers     Recline   []  Power recline on power chair  []  Manual recline on manual chair  Comments:    []  intermittent catheterization  []  manage spasticity  []  accommodate femur to back angle  []  change position for pressure relief/cannot weight shift rhigh risk of pressure sore development  []  tilt alone does not accomplish     effective pressure relief, maximum pressure relief achieved at -  _______ degrees tilt   _______ degrees recline   []  difficult to transfer to and from bed []  rest periods and sleeping in chair  []  repositioning for transfers  []  bring to full recline for ADL care  []  clothing/diaper changes in chair  []  gravity PEG tube feeding  []  head positioning  []  decrease pain  []  blood pressure management   []  control autonomic dysreflexia  []  decrease respiratory distress  []  user on ventilator     Elevator on mobility base  []  Power wheelchair  []  Scooter  []  increase Indep in transfers   []  increase Indep in ADLs    []  bathroom function and safety  []  kitchen/cooking function and safety  []  shopping  []  raise height for communication at standing level  []  raise height for eye contact which  reduces cervical neck strain and pain  []  drive at raised height for safety and navigating crowds  []  Other:   []  Vertical position system  (anterior tilt)     (Drive locks-out)    []  Stand       (Drive enabled)  []  independent weight bearing  []  decrease joint contractures  []  decrease/manage spasticity  []  decrease/manage spasms  []  pressure distribution away from   scapula, sacrum, coccyx, and ischial tuberosity  []  increase digestion and elimination   []  access to counters and cabinets  []  increase reach  []  increase interaction with others at eye level, reduces neck strain  []  increase performance of       MRADL(s)      Power elevating legrest    []  Center mount (Single) 85-170 degrees       []  Standard (Pair) 100-170 degrees  []  position legs at 90 degrees, not available with std power ELR  []  center mount tucks into chair to decrease turning radius in home, not available with std power ELR  []  provide change in position for LE  []  elevate legs during recline    []  maintain placement of feet on      footplate  []  decrease edema  []  improve circulation  []  actuator needed to elevate legrest  []  actuator needed to articulate legrest preventing knees from flexing  []  Increase ground clearance over      curbs  []   STD (pair) independently                     elevate legrest   POWER WHEELCHAIR CONTROLS      Controls/input device  []  Expandable  []  Non-expandable  []  Proportional  []  Right Hand []  Left Hand  []  Non-proportional/switches/head-array  []  Electrical/proximity         []   Mechanical      Manufacturer:___________________   Type:________________________ []  provides access for controlling wheelchair  []  programming for accurate control  []  progressive disease/changing condition  []  required for alternative drive      controls       []  lacks motor control to operate  proportional drive control  []  unable to understand proportional controls  []  limited  movement/strength  []  extraneous movement / tremors / ataxic / spastic       []  Upgraded electronics controller/harness    []  Single power (tilt or recline)   []  Expandable    []  Non-expandable plus   []  Multi-power (tilt, recline, power legrest, power seat lift, vertical positioning system, stand)  []  allows input device to communicate with drive motors  []  harness provides necessary  connections between the controller, input device, and seat functions     []  needed in order to operate power seat functions through joystick/ input device  []  required for alternative drive controls     []  Enhanced display  []  required to connect all alternative drive controls   []  required for upgraded joystick      (lite-throw, heavy duty, micro)  []  Allows user to see in which mode and drive the wheelchair is set; necessary for alternate controls       []  Upgraded tracking electronics  []  correct tracking when on uneven surfaces makes switch driving more efficient and less fatiguing  []  increase safety when driving  []  increase ability to traverse thresholds    []  Safety / reset / mode switches     Type:    []  Used to change modes and stop the wheelchair when driving     [x]  Mount for joystick / input device/switches  [x]  swing away for access or transfers   [x]  attaches joystick / input device / switches to wheelchair   [x]  provides for consistent access  []  midline for optimal placement    []  Attendant controlled joystick plus     mount  []  safety  []  long distance driving  []  operation of seat functions  []  compliance with transportation regulations    [x]  Battery  [x]  required to power (power assist / scooter/ power wc / other):   []  Power inverter (24V to 12V)  []  required for ventilator / respiratory equipment / other:     CHAIR OPTIONS MANUAL & POWER      Armrests   [x]  adjustable height []  removable  []  swing away []  fixed  [x]  flip back  []  reclining  []  full length pads []  desk []   tube arms []  gel pads  [x]  provide support with elbow at 90    [x]  remove/flip back/swing away for  transfers  [x]  provide support and positioning of upper body    []  allow to come closer to table top  []  remove for access to tables  []  provide support for w/c tray  [x]  change of height/angles for variable activities   []  Elbow support / Elbow stop  []  keep elbow positioned on arm pad  []  keep arms from falling off arm pad  during tilt and/or recline   Upper Extremity Support  []  Arm trough  []   R  []   L  Style:  []  swivel mount []  fixed mount   []  posterior hand support  []   tray  []  full tray  []  joystick cut out  []   R  []   L  Style:  []  decrease gravitational pull on      shoulders  []  provide support to increase UE  function  []  provide hand support in natural    position  []  position flaccid UE  []  decrease subluxation    []  decrease edema       []  manage spasticity   []  provide midline positioning  []  provide work surface  []  placement for AAC/ Computer/ EADL       Hangers/ Legrests   []  ______ degree  []  Elevating []  articulating  []  swing away []  fixed []  lift off  []  heavy duty  []  adjustable knee angle  []  adjustable calf panel   []  longer extension tube              []  provide LE support  []  maintain  placement of feet on      footplate   []  accommodate lower leg length  []  accommodate to hamstring       tightness  []  enable transfers  []  provide change in position for LE's  []  elevate legs during recline    []  decrease edema  []  durability      Foot support   []  footplate []  R []  L []  flip up           []  Depth adjustable   []  angle adjustable  []  foot board/one piece    []  provide foot support  []  accommodate to ankle ROM  []  allow foot to go under wheelchair base  []  enable transfers     []  Shoe holders  []  position foot    []  decrease / manage spasticity  []  control position of LE  []  stability    []  safety     []  Ankle strap/heel      loops   []  support foot on foot support  []  decrease extraneous movement  []  provide input to heel   []  protect foot     []  Amputee adapter []  R  []  L     Style:                  Size:  []  Provide support for stump/residual extremity    []  Transportation tie-down  []  to provide crash tested tie-down brackets    []  Crutch/cane holder    []  O2 holder    []  IV hanger   []  Ventilator tray/mount    []  stabilize accessory on wheelchair       Component  Justification     []  Seat cushion      []  accommodate impaired sensation  []  decubitus ulcers present or history  []  unable to shift weight  []  increase pressure distribution  []  prevent pelvic extension  []  custom required "off-the-shelf"    seat cushion will not accommodate deformity  []  stabilize/promote pelvis alignment  []  stabilize/promote femur alignment  []  accommodate obliquity  []  accommodate multiple deformity  []  incontinent/accidents  []  low maintenance     []  seat mounts                 []  fixed []  removable  []  attach seat platform/cushion to wheelchair frame    []  Seat wedge    []  provide increased aggressiveness of seat shape to decrease sliding  down in the seat  []  accommodate ROM        []  Cover replacement   []  protect back or seat cushion  []  incontinent/accidents    []  Solid seat / insert    []  support cushion to prevent      hammocking  []  allows attachment of cushion to mobility base    []  Lateral pelvic/thigh/hip     support (Guides)     []  decrease abduction  []  accommodate pelvis  []  position upper legs  []  accommodate spasticity  []  removable for transfers     []  Lateral pelvic/thigh      supports mounts  []  fixed   []  swing-away   []  removable  []  mounts lateral pelvic/thigh supports     []  mounts lateral pelvic/thigh supports swing-away or removable for transfers    []  Medial thigh support ([place]mel)  [] decrease adduction  [] accommodate ROM  []  remove for transfers   []  alignment      []  Medial thigh   []   fixed      support mounts      []  swing-away   []  removable  []  mounts medial thigh supports   []  Mounts medial supports swing- away or removable for transfers       Component  Justification   []  Back       []  provide posterior trunk support []  facilitate tone  []  provide lumbar/sacral support []  accommodate deformity  []  support trunk in midline   []  custom required "off-the-shelf" back support will not accommodate deformity   []  provide lateral trunk support []  accommodate or decrease tone            []  Back mounts  []  fixed  []  removable  []  attach back rest/cushion to wheelchair frame   []  Lateral trunk      supports  []  R []  L  []  decrease lateral trunk leaning  []  accommodate asymmetry    []  contour for increased contact  []  safety    []  control of tone    []  Lateral trunk      supports mounts  []  fixed  []  swing-away   []  removable  []  mounts lateral trunk supports     []  Mounts lateral trunk supports swing-away or removable for transfers   []  Anterior chest      strap, vest     []  decrease forward movement of shoulder  []  decrease forward movement of trunk  []  safety/stability  []  added abdominal support  []  trunk alignment  []  assistance with shoulder control   []  decrease shoulder elevation    []  Headrest      []  provide posterior head support  []  provide posterior neck support  []  provide lateral head support  []  provide anterior head support  []  support during tilt and recline  []  improve feeding     []  improve respiration  []  placement of switches  []  safety    []  accommodate ROM   []  accommodate tone  []  improve visual orientation   []  Headrest           []  fixed []  removable []  flip down      Mounting hardware   []  swing-away laterals/switches  []  mount headrest   []  mounts headrest flip down or  removable for transfers  []  mount headrest swing-away laterals   []  mount switches     []  Neck Support    []  decrease neck rotation  []  decrease forward neck  flexion   Pelvic Positioner    []  std hip belt          []  padded hip belt  []  dual pull hip belt  []  four point hip belt  []  stabilize tone  []  decrease falling out of chair  []  prevent excessive extension  []  special pull angle to control      rotation  []  pad for protection over boney   prominence  []  promote comfort    []  Essential needs        bag/pouch   []  medicines []  special food rorthotics []  clothing changes  []  diapers  []  catheter/hygiene []  ostomy supplies   The above equipment has a life- long use expectancy.  Growth and changes in medical and/or functional conditions would be the exceptions.   SUMMARY:  Why mobility device was selected; include why a lower level device is not appropriate: Patient requires use of a power wheelchair in order to perform safe and independent mobility in her home and  in order to perform her MRADLs in a timely manner. 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 Additionally, due to her history of frequent falls (see above), she is unable to ambulate further than 25-30 ft and has poor standing tolerance, therefore requiring power wheeled mobility.   ASSESSMENT:  CLINICAL IMPRESSION: Patient is a 53 y.o. female who was seen today for physical therapy evaluation for a new power wheelchair.    OBJECTIVE IMPAIRMENTS Abnormal gait, cardiopulmonary status limiting activity, decreased activity tolerance, decreased balance, decreased coordination, decreased endurance, decreased mobility, difficulty walking, decreased strength, increased edema, increased muscle spasms, impaired sensation, impaired UE functional use, and postural dysfunction.   ACTIVITY LIMITATIONS carrying, lifting, bending, standing, squatting, stairs, transfers, bathing, toileting,  dressing, reach over head, and hygiene/grooming  PARTICIPATION LIMITATIONS: meal prep, cleaning, laundry, driving, shopping, and community activity  CLINICAL DECISION MAKING: Stable/uncomplicated  EVALUATION COMPLEXITY: High                                   GOALS: One time visit. No goals established.    PLAN: PT FREQUENCY: one time visit    Lorita Rosa, PT Lorita Rosa, PT, DPT, CSRS  08/12/2023, 10:54 AM    I concur with the above findings and recommendations of the therapist:  Physician name printed:         Physician's signature:      Date:

## 2023-08-13 ENCOUNTER — Encounter: Payer: Self-pay | Admitting: Family Medicine

## 2023-08-13 DIAGNOSIS — Z8673 Personal history of transient ischemic attack (TIA), and cerebral infarction without residual deficits: Secondary | ICD-10-CM

## 2023-08-18 ENCOUNTER — Telehealth: Payer: Self-pay | Admitting: *Deleted

## 2023-08-18 NOTE — Telephone Encounter (Signed)
 Pt informed and already picked up letter. Odies Desa Maynard Spears, CMA

## 2023-08-18 NOTE — Addendum Note (Signed)
 Addended by: Bevin Bucks, Zuha Dejonge on: 08/18/2023 02:22 PM   Modules accepted: Orders

## 2023-08-18 NOTE — Telephone Encounter (Signed)
 Please let patient know  Referral has been placed Letter is letters tab--will need printed As medications for reflux, will definitely discuss at upcoming visit Otho Blitz, MD  [facility] Medicine Teaching Service

## 2023-08-24 ENCOUNTER — Telehealth: Payer: Self-pay | Admitting: Family Medicine

## 2023-08-24 NOTE — Telephone Encounter (Signed)
 Hi Risk manager,  Please schedule this patient for follow up with me for specific visit for wheel documentation only  in the next 1-2 months.This is a lengthy visit requiring specific documentation---she also needs a second visit in later July for her diabetes.   Thanks, Otho Blitz, MD  [facility] Medicine Teaching Service

## 2023-08-25 ENCOUNTER — Encounter: Payer: Self-pay | Admitting: Family Medicine

## 2023-08-26 ENCOUNTER — Encounter: Payer: Self-pay | Admitting: Family Medicine

## 2023-08-29 ENCOUNTER — Other Ambulatory Visit: Payer: Self-pay

## 2023-09-05 ENCOUNTER — Telehealth: Payer: Self-pay | Admitting: *Deleted

## 2023-09-05 MED ORDER — INSULIN PEN NEEDLE 29G X 12.7MM MISC: 12 refills | Status: AC

## 2023-09-05 NOTE — Telephone Encounter (Signed)
 Rx request for pen needles. Please advise. Amber [name], CMA

## 2023-09-05 NOTE — Telephone Encounter (Signed)
 Rx sent.

## 2023-09-16 NOTE — Progress Notes (Unsigned)
 SUBJECTIVE:   CHIEF COMPLAINT: power wheelchair HPI:   Amber [name] is a 53 y.o.  with history notable for CVA with residual weakness and falls, type 2 DM not well controlled and HTN presenting for follow up. She is planning a trip to [place] (cruise!) in winter  Diabetes Patient started mounjaro . Tolerating well, feels it helps her. Has stopped her long acting insulin . BG in 200s.  No lows. No polyuria or polydipsia.  CVA No recent falls. Uses walker and/or cane. Lives with roommate who is gone most of day. Has Pip at home.   HTN Did not take medications today. Just got her home cuff back and will check values. No HA, vision changes, CP, dyspnea.   Medical Necessity for Wheelchair  1.  Patient requires and uses a wheelchair to move around in their residence: yes  2.  Patient has quadriplegia or a fixed hip angle or excessive extensor tone of the trunk muscles or a trunk brace or need to rest in a recumbent position two or more times during the day:  no  3.  Patient has a cast, brace or musculoskeletal condition which prevents 90 degree flexion of the knee, or the patient has significant edema of the lower extremity that requires a elevated leg rest, or is a reclining back ordered: no  4. Patient has a need for wheelchair arm height different than that available using non-adjustable wheelchair arms : no  5.  How many hours per day does the patient spend in the wheel chair (1-24): 5  6. Patient has severe weakness of upper extremities due to neurologic, muscular or cardiopulmonary disease / condition: yes due to stroke  7.  Patient is able to operate any type of manual wheelchair: yes---  or does patient have another person available in their home to assist:  correct--no one in home to assist  8. Patient has mobility limitations which cannot be resolved with a cane, crutch or walker:  yes ------------------------------------------------------------------------------------------------------------------- Questions 1, 6, 7 for motorized wheelchair Questions 1 through 5 for wheelchair options/accessories.  Estimated Length of Need (# OF MONTHS): ____99__ 1-99 (99=LIFETIME)  Is patient's condition likely to improve such that they would not require a manual wheelchair: No DIAGNOSIS CODES (ICD-9): Z86.73,  I60,  R29.6_________ _________ _________  Body weight:  Last Weight  Most recent update: 09/19/2023 10:15 AM    Weight  125.8 kg (277 lb 6.4 oz)             Body height:66 inches  PERTINENT  PMH / PSH/Family/Social History : CVA with residual weakness, obesity, type  2 DM, falls, HTN, dyslipidemia   OBJECTIVE:   BP (!) 153/89   Pulse 88   Ht 5' 6 (1.676 m)   Wt 277 lb 6.4 oz (125.8 kg)   LMP 04/19/2021 (Approximate)   SpO2 100%   BMI 44.77 kg/m   Today's weight:  Last Weight  Most recent update: 09/19/2023 10:15 AM    Weight  125.8 kg (277 lb 6.4 oz)            Review of prior weights: [employer]   09/19/23 1015  Weight: 277 lb 6.4 oz (125.8 kg)   RRR Lungs clear bilaterally Reduced grip strength on L as compared to R Proximal weakness of R shoulder as compared to L   ASSESSMENT/PLAN:   Assessment & Plan History of CVA (cerebrovascular accident) Has had multiple falls and has no one at home to help her  Recommend power wheelchair  Primary hypertension Not at goal She did not take medications today Home values---she will call with 2-3 per week Follow up 1 month  OSA (obstructive sleep apnea)  Type 2 diabetes mellitus with other circulatory complication, without long-term current use of insulin  (HCC) A1C above goal Discussed risks--including risk of another stroke Increase Mounjaro  Restart long acting insulin  15 units BID (was on 40 BID) Follow up 1 month Offered a CGM--declined UACR today  Lipids today  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

## 2023-09-19 ENCOUNTER — Telehealth: Payer: Self-pay | Admitting: Family Medicine

## 2023-09-19 ENCOUNTER — Encounter: Payer: Self-pay | Admitting: Family Medicine

## 2023-09-19 ENCOUNTER — Ambulatory Visit (INDEPENDENT_AMBULATORY_CARE_PROVIDER_SITE_OTHER): Admitting: Family Medicine

## 2023-09-19 ENCOUNTER — Other Ambulatory Visit: Payer: Self-pay | Admitting: *Deleted

## 2023-09-19 VITALS — BP 153/89 | HR 88 | Ht 66.0 in | Wt 277.4 lb

## 2023-09-19 DIAGNOSIS — Z91014 Allergy to mammalian meats: Secondary | ICD-10-CM

## 2023-09-19 DIAGNOSIS — G4733 Obstructive sleep apnea (adult) (pediatric): Secondary | ICD-10-CM | POA: Diagnosis not present

## 2023-09-19 DIAGNOSIS — Z8673 Personal history of transient ischemic attack (TIA), and cerebral infarction without residual deficits: Secondary | ICD-10-CM | POA: Diagnosis not present

## 2023-09-19 DIAGNOSIS — I10 Essential (primary) hypertension: Secondary | ICD-10-CM | POA: Diagnosis not present

## 2023-09-19 DIAGNOSIS — E1159 Type 2 diabetes mellitus with other circulatory complications: Secondary | ICD-10-CM | POA: Diagnosis not present

## 2023-09-19 LAB — POCT GLYCOSYLATED HEMOGLOBIN (HGB A1C): HbA1c, POC (controlled diabetic range): 10.5 % — AB (ref 0.0–7.0)

## 2023-09-19 MED ORDER — TIRZEPATIDE-WEIGHT MANAGEMENT 5 MG/0.5ML ~~LOC~~ SOLN
5.0000 mg | SUBCUTANEOUS | 0 refills | Status: DC
Start: 2023-09-19 — End: 2023-09-20

## 2023-09-19 MED ORDER — TRESIBA FLEXTOUCH 100 UNIT/ML ~~LOC~~ SOPN: 20.0000 [IU] | PEN_INJECTOR | Freq: Two times a day (BID) | SUBCUTANEOUS | 3 refills | Status: DC

## 2023-09-19 MED ORDER — EPINEPHRINE 0.3 MG/0.3ML IJ SOAJ: 0.3000 mg | INTRAMUSCULAR | 1 refills | Status: AC | PRN

## 2023-09-19 NOTE — Assessment & Plan Note (Signed)
 Not at goal She did not take medications today Home values---she will call with 2-3 per week Follow up 1 month

## 2023-09-19 NOTE — Patient Instructions (Signed)
 It was wonderful to see you today.  Please bring ALL of your medications with you to every visit.   Today we talked about:  We will check blood work today  I recommend a colonoscopy!   Please restart your long acting insulin --I sent in a refill  You are working SO hard with exercise!! Keep it up Your A1C is still high at 10.5  I want to reduce your risk of Heart attack and stroke  I sent in an increased dose of Ozempic   You will be called by therapy  I will send in notes for your power chair    Please follow up in 3 months   Thank you for choosing [facility].   Please call [phone] with any questions about today's appointment.  Please be sure to schedule follow up at the front  desk before you leave today.   Suzann Daring, MD  Family Medicine

## 2023-09-19 NOTE — Telephone Encounter (Signed)
Reviewed, completed, and signed form.  Note routed to RN team inbasket and placed completed form in RN Wall pocket in the front office.  Shadeed Colberg M Lorne Winkels, MD  

## 2023-09-19 NOTE — Assessment & Plan Note (Signed)
 Has had multiple falls and has no one at home to help her Recommend power wheelchair

## 2023-09-19 NOTE — Telephone Encounter (Signed)
 Per pharmacy vials are on back order, please send in pens. Gloriann Riede Norville, CMA

## 2023-09-19 NOTE — Assessment & Plan Note (Signed)
 A1C above goal Discussed risks--including risk of another stroke Increase Mounjaro  Restart long acting insulin  15 units BID (was on 40 BID) Follow up 1 month Offered a CGM--declined UACR today  Lipids today

## 2023-09-20 ENCOUNTER — Ambulatory Visit: Payer: Self-pay | Admitting: Family Medicine

## 2023-09-20 LAB — LIPID PANEL
Chol/HDL Ratio: 6.6 ratio — ABNORMAL HIGH (ref 0.0–4.4)
Cholesterol, Total: 246 mg/dL — ABNORMAL HIGH (ref 100–199)
HDL: 37 mg/dL — ABNORMAL LOW (ref >39)
LDL Chol Calc (NIH): 146 mg/dL — ABNORMAL HIGH (ref 0–99)
Triglycerides: 344 mg/dL — ABNORMAL HIGH (ref 0–149)
VLDL Cholesterol Cal: 63 mg/dL — ABNORMAL HIGH (ref 5–40)

## 2023-09-20 LAB — MICROALBUMIN / CREATININE URINE RATIO
Creatinine, Urine: 106.3 mg/dL
Microalb/Creat Ratio: 166 mg/g{creat} — ABNORMAL HIGH (ref 0–29)
Microalbumin, Urine: 176.6 ug/mL

## 2023-09-20 MED ORDER — MOUNJARO 5 MG/0.5ML ~~LOC~~ SOAJ: 5.0000 mg | SUBCUTANEOUS | 0 refills | Status: DC

## 2023-09-20 NOTE — Telephone Encounter (Signed)
 Alternative sent

## 2023-09-20 NOTE — Telephone Encounter (Signed)
 6/30 office visit notes printed and attached to [employer] form.   This has been placed in fax pile.

## 2023-09-22 ENCOUNTER — Encounter: Payer: Self-pay | Admitting: Family Medicine

## 2023-09-22 ENCOUNTER — Emergency Department (HOSPITAL_COMMUNITY)
Admission: EM | Admit: 2023-09-22 | Discharge: 2023-09-22 | Disposition: A | Discharge status: Home / Self Care | Attending: Emergency Medicine | Admitting: Emergency Medicine

## 2023-09-22 ENCOUNTER — Other Ambulatory Visit: Payer: Self-pay

## 2023-09-22 ENCOUNTER — Encounter (HOSPITAL_COMMUNITY): Payer: Self-pay | Admitting: *Deleted

## 2023-09-22 DIAGNOSIS — Z7982 Long term (current) use of aspirin: Secondary | ICD-10-CM | POA: Insufficient documentation

## 2023-09-22 DIAGNOSIS — E119 Type 2 diabetes mellitus without complications: Secondary | ICD-10-CM | POA: Insufficient documentation

## 2023-09-22 DIAGNOSIS — Z794 Long term (current) use of insulin: Secondary | ICD-10-CM | POA: Diagnosis not present

## 2023-09-22 DIAGNOSIS — Z79899 Other long term (current) drug therapy: Secondary | ICD-10-CM | POA: Insufficient documentation

## 2023-09-22 DIAGNOSIS — I10 Essential (primary) hypertension: Secondary | ICD-10-CM | POA: Diagnosis not present

## 2023-09-22 NOTE — ED Provider Notes (Signed)
 [place] EMERGENCY DEPARTMENT AT [facility] Provider Note   CSN: [id number] Arrival date & time: 09/22/23  1527     Patient presents with: Hypertension   Amber [name] is a 53 y.o. female.  Patient presents to emergency department with concerns of hypertension.  Past history significant for hypertension, type 2 diabetes, prior CVA presents with concerns of blood pressure last night being severely elevated.  She is currently on medications but has not taken medications last few days.  Took her medication this morning and has had significant improvement in her blood pressure.  Denies any headache, vision changes, chest pain, shortness of breath, or leg swelling.   Hypertension       Prior to Admission medications   Medication Sig Start Date End Date Taking? Authorizing Provider  Accu-Chek Softclix Lancets lancets Use as instructed 11/02/21   Delores Suzann HERO, MD  acetaminophen  (TYLENOL ) 500 MG tablet Take 500 mg by mouth every 6 (six) hours as needed.    [provider]  amLODipine  (NORVASC ) 5 MG tablet Take 1 tablet (5 mg total) by mouth at bedtime. 08/13/22   Delores Suzann HERO, MD  aspirin  EC 81 MG tablet Take 1 tablet (81 mg total) by mouth daily. Swallow whole. 08/13/22   Delores Suzann HERO, MD  atorvastatin  (LIPITOR ) 80 MG tablet Take 1 tablet (80 mg total) by mouth at bedtime. 03/11/23   Delores Suzann HERO, MD  diclofenac  Sodium (VOLTAREN ) 1 % GEL Apply 4 g topically 4 (four) times daily as needed. Patient not taking: Reported on 12/25/2021 06/17/21   Austin Ade, MD  DULoxetine (CYMBALTA) 20 MG capsule Take 60 mg by mouth daily.    [provider]  EPINEPHrine  0.3 mg/0.3 mL IJ SOAJ injection Inject 0.3 mg into the muscle as needed for anaphylaxis. 09/19/23   Delores Suzann HERO, MD  fluticasone  (FLONASE ) 50 MCG/ACT nasal spray Place 2 sprays into both nostrils daily. 07/07/23   Rumball, Alison M, DO  glucose blood (RELION GLUCOSE TEST STRIPS) test strip Use as  instructed 11/02/21   Delores Suzann HERO, MD  insulin  degludec (TRESIBA  FLEXTOUCH) 100 UNIT/ML FlexTouch Pen Inject 20 Units into the skin 2 (two) times daily. 09/19/23   Delores Suzann HERO, MD  insulin  lispro (HUMALOG ) 100 UNIT/ML KwikPen Inject 40-50 Units into the skin 2 (two) times daily before a meal. Use 40-60 units twice daily with meals 04/04/23   McDiarmid, Krystal BIRCH, MD  Insulin  Pen Needle 29G X 12.7MM MISC Use to inject insulin  four times daily. 09/05/23   Rumball, Alison M, DO  naltrexone  (DEPADE) 50 MG tablet Take 1 tablet (50 mg total) by mouth daily. 08/13/22   Delores Suzann HERO, MD  nystatin  (MYCOSTATIN /NYSTOP ) powder Apply 1 Application topically 3 (three) times daily. 07/19/22   Delores Suzann HERO, MD  nystatin -triamcinolone  ointment (MYCOLOG) Apply 1 Application topically 2 (two) times daily. 01/27/22   Delores Suzann HERO, MD  tirzepatide  (MOUNJARO ) 5 MG/0.5ML Pen Inject 5 mg into the skin once a week. 09/20/23   Delores Suzann HERO, MD  valsartan -hydrochlorothiazide  (DIOVAN  HCT) 160-25 MG tablet Take 1 tablet by mouth daily. 11/25/22   Delores Suzann HERO, MD    Allergies: Alpha-gal, Metformin, and Penicillins    Review of Systems  Constitutional:        Hypertension  All other systems reviewed and are negative.   Updated Vital Signs BP 134/80   Pulse (!) 119   Temp 98.5 F (36.9 C) (Oral)   Resp 18  LMP 04/19/2021 (Approximate)   SpO2 95%   Physical Exam Vitals and nursing note reviewed.  Constitutional:      General: She is not in acute distress.    Appearance: She is well-developed.  HENT:     Head: Normocephalic and atraumatic.  Eyes:     Conjunctiva/sclera: Conjunctivae normal.  Cardiovascular:     Rate and Rhythm: Normal rate and regular rhythm.     Heart sounds: No murmur heard. Pulmonary:     Effort: Pulmonary effort is normal. No respiratory distress.     Breath sounds: Normal breath sounds.  Abdominal:     Palpations: Abdomen is soft.     Tenderness: There is no abdominal  tenderness.  Musculoskeletal:        General: No swelling.     Cervical back: Neck supple.  Skin:    General: Skin is warm and dry.     Capillary Refill: Capillary refill takes less than 2 seconds.  Neurological:     Mental Status: She is alert.  Psychiatric:        Mood and Affect: Mood normal.     (all labs ordered are listed, but only abnormal results are displayed) Labs Reviewed - No data to display  EKG: None  Radiology: No results found.   Procedures   Medications Ordered in the ED - No data to display                                  Medical Decision Making  This patient presents to the ED for concern of hypertension.  Differential diagnosis includes asymptomatic hypertension, dehydration, hypertensive urgency, hypertensive emergency, anxiety   Problem List / ED Course:  Patient presents to the emergency department with concerns of hypertension.  Patient has known hypertension and is currently on blood pressure medications which include amlodipine , as well as valsartan  hydrochlorothiazide .  Reports she takes these medications as prescribed and was concerned due to prior stroke.  Denies any feelings of headache, vision changes, chest pain, shortness of breath, or leg swelling. On exam, she is well-appearing.  No abnormal heart or lung sounds.  No lower extremity swelling or edema.  Blood pressure on repeat shows only borderline hypertension at 134/80.  Pulse rate is slightly elevated which I suspect is secondary to patient being somewhat anxious about current ED encounter as well as patient currently trying to manage her mother who is here in the emergency department as well. In the setting, this is a case of asymptomatic hypertension.  Her blood pressure improved after taking her home dose of medications.  I have no acute or focal concerns at this time for hypertensive emergency given lack of any systemic symptoms such as chest pain, shortness of breath, leg swelling,  vision changes or severe headache.  Will discharge home with instructions to continue taking her home medications.  Return precautions discussed.  Otherwise stable for outpatient follow-up and discharged home.   Final diagnoses:  Asymptomatic hypertension    ED Discharge Orders     None          Harlene Petralia A, PA-C 09/22/23 2155    Francesca Elsie CROME, MD 09/22/23 [phone]

## 2023-09-22 NOTE — Discharge Instructions (Signed)
 You were seen in the emergency department today for concerns of elevated blood pressure.  Her blood pressure with us  is thankfully reassuring.  With your history of your prior stroke, if you ever develop severe hypertension and have other symptoms along with that such as severe worst headache of your life, chest pain, shortness of breath, leg swelling, please return to the emergency department immediately.

## 2023-09-22 NOTE — ED Triage Notes (Signed)
 Pt has not been taking her BP meds for the past few days and states that her BP has been elevated today so she took the medication (last pm and this am) No CP or sob with this

## 2023-09-22 NOTE — ED Triage Notes (Signed)
 PT arrives via POV. Pt reports her blood pressure has been running high yesterday and today. Pt reports just feeling a little sluggish. Denies any other associated symptoms.

## 2023-09-28 ENCOUNTER — Encounter: Payer: Self-pay | Admitting: Family Medicine

## 2023-10-11 ENCOUNTER — Telehealth: Payer: Self-pay

## 2023-10-11 ENCOUNTER — Encounter: Payer: Self-pay | Admitting: Family Medicine

## 2023-10-11 NOTE — Telephone Encounter (Signed)
 Amber [name] calls nurse line requesting to speak with Dr. Delores.   She reports she is patients therapist and she would like to discuss patients case.   Advised Dr. Delores is working our hospital service, however will forward her the message.   [phone].

## 2023-10-11 NOTE — Telephone Encounter (Signed)
 Called and spoke with Brad per patient request

## 2023-10-14 DIAGNOSIS — Z8673 Personal history of transient ischemic attack (TIA), and cerebral infarction without residual deficits: Secondary | ICD-10-CM | POA: Diagnosis not present

## 2023-10-14 DIAGNOSIS — E1159 Type 2 diabetes mellitus with other circulatory complications: Secondary | ICD-10-CM | POA: Diagnosis not present

## 2023-10-14 DIAGNOSIS — R296 Repeated falls: Secondary | ICD-10-CM | POA: Diagnosis not present

## 2023-10-14 DIAGNOSIS — G8191 Hemiplegia, unspecified affecting right dominant side: Secondary | ICD-10-CM | POA: Diagnosis not present

## 2023-10-14 MED ORDER — FAMOTIDINE 20 MG PO TABS: 20.0000 mg | ORAL_TABLET | Freq: Two times a day (BID) | ORAL | 3 refills | Status: AC

## 2023-10-14 MED ORDER — FAMOTIDINE 20 MG PO TABS: 20.0000 mg | ORAL_TABLET | Freq: Two times a day (BID) | ORAL | 3 refills | Status: DC

## 2023-10-14 NOTE — Addendum Note (Signed)
 Addended by: Orris Perin C on: 10/14/2023 02:46 PM   Modules accepted: Orders

## 2023-10-22 ENCOUNTER — Encounter: Payer: Self-pay | Admitting: Family Medicine

## 2023-10-26 ENCOUNTER — Encounter: Payer: Self-pay | Admitting: Family Medicine

## 2023-11-14 DIAGNOSIS — G8191 Hemiplegia, unspecified affecting right dominant side: Secondary | ICD-10-CM | POA: Diagnosis not present

## 2023-11-14 DIAGNOSIS — R296 Repeated falls: Secondary | ICD-10-CM | POA: Diagnosis not present

## 2023-11-14 DIAGNOSIS — E1159 Type 2 diabetes mellitus with other circulatory complications: Secondary | ICD-10-CM | POA: Diagnosis not present

## 2023-11-14 DIAGNOSIS — Z8673 Personal history of transient ischemic attack (TIA), and cerebral infarction without residual deficits: Secondary | ICD-10-CM | POA: Diagnosis not present

## 2023-11-22 ENCOUNTER — Encounter: Payer: Self-pay | Admitting: Family Medicine

## 2023-11-22 DIAGNOSIS — E1159 Type 2 diabetes mellitus with other circulatory complications: Secondary | ICD-10-CM

## 2023-11-23 MED ORDER — INSULIN LISPRO (1 UNIT DIAL) 100 UNIT/ML (KWIKPEN): 40.0000 [IU] | PEN_INJECTOR | Freq: Two times a day (BID) | SUBCUTANEOUS | 3 refills | Status: DC

## 2023-11-25 ENCOUNTER — Encounter: Payer: Self-pay | Admitting: Family Medicine

## 2023-11-25 DIAGNOSIS — E1159 Type 2 diabetes mellitus with other circulatory complications: Secondary | ICD-10-CM

## 2023-11-29 MED ORDER — NOVOLOG FLEXPEN 100 UNIT/ML ~~LOC~~ SOPN
PEN_INJECTOR | SUBCUTANEOUS | 11 refills | Status: AC
Start: 2023-11-29 — End: ?

## 2023-11-29 NOTE — Addendum Note (Signed)
 Addended by: DELORES, Tabitha Tupper on: 11/29/2023 11:35 AM   Modules accepted: Orders

## 2023-11-29 NOTE — Telephone Encounter (Signed)
 Attempted to call patient--Red Team- can you please call pharmacy and see if they have Rx for Humalog ? I sent last week.   Patient should not be on both Humalog  and Novolog .  Suzann Daring, MD  [facility] Medicine Teaching Service

## 2023-11-30 ENCOUNTER — Telehealth: Payer: Self-pay | Admitting: Family Medicine

## 2023-11-30 MED ORDER — COVID-19 MRNA VAC-TRIS(PFIZER) 30 MCG/0.3ML IM SUSY: 0.3000 mL | PREFILLED_SYRINGE | Freq: Once | INTRAMUSCULAR | 0 refills | Status: AC

## 2023-11-30 NOTE — Telephone Encounter (Signed)
 COVID vaccine to pharmacy

## 2023-12-05 ENCOUNTER — Emergency Department (HOSPITAL_COMMUNITY)

## 2023-12-05 ENCOUNTER — Other Ambulatory Visit: Payer: Self-pay

## 2023-12-05 ENCOUNTER — Emergency Department (HOSPITAL_COMMUNITY)
Admission: EM | Admit: 2023-12-05 | Discharge: 2023-12-06 | Disposition: A | Discharge status: Home / Self Care | Attending: Emergency Medicine | Admitting: Emergency Medicine

## 2023-12-05 ENCOUNTER — Encounter (HOSPITAL_COMMUNITY): Payer: Self-pay

## 2023-12-05 DIAGNOSIS — R42 Dizziness and giddiness: Secondary | ICD-10-CM | POA: Diagnosis not present

## 2023-12-05 DIAGNOSIS — I10 Essential (primary) hypertension: Secondary | ICD-10-CM | POA: Diagnosis not present

## 2023-12-05 DIAGNOSIS — Z8673 Personal history of transient ischemic attack (TIA), and cerebral infarction without residual deficits: Secondary | ICD-10-CM | POA: Diagnosis not present

## 2023-12-05 DIAGNOSIS — R29818 Other symptoms and signs involving the nervous system: Secondary | ICD-10-CM | POA: Diagnosis not present

## 2023-12-05 DIAGNOSIS — N289 Disorder of kidney and ureter, unspecified: Secondary | ICD-10-CM | POA: Diagnosis not present

## 2023-12-05 DIAGNOSIS — Z7982 Long term (current) use of aspirin: Secondary | ICD-10-CM | POA: Diagnosis not present

## 2023-12-05 DIAGNOSIS — I6523 Occlusion and stenosis of bilateral carotid arteries: Secondary | ICD-10-CM | POA: Diagnosis not present

## 2023-12-05 DIAGNOSIS — D72829 Elevated white blood cell count, unspecified: Secondary | ICD-10-CM | POA: Diagnosis not present

## 2023-12-05 DIAGNOSIS — R519 Headache, unspecified: Secondary | ICD-10-CM | POA: Insufficient documentation

## 2023-12-05 DIAGNOSIS — E119 Type 2 diabetes mellitus without complications: Secondary | ICD-10-CM | POA: Diagnosis not present

## 2023-12-05 DIAGNOSIS — R531 Weakness: Secondary | ICD-10-CM | POA: Diagnosis not present

## 2023-12-05 DIAGNOSIS — Z79899 Other long term (current) drug therapy: Secondary | ICD-10-CM | POA: Insufficient documentation

## 2023-12-05 DIAGNOSIS — Z794 Long term (current) use of insulin: Secondary | ICD-10-CM | POA: Insufficient documentation

## 2023-12-05 DIAGNOSIS — N390 Urinary tract infection, site not specified: Secondary | ICD-10-CM | POA: Diagnosis not present

## 2023-12-05 HISTORY — DX: Cerebral infarction, unspecified: I63.9

## 2023-12-05 LAB — CBC WITH DIFFERENTIAL/PLATELET
Abs Immature Granulocytes: 0.05 K/uL (ref 0.00–0.07)
Basophils Absolute: 0.1 K/uL (ref 0.0–0.1)
Basophils Relative: 1 %
Eosinophils Absolute: 0.3 K/uL (ref 0.0–0.5)
Eosinophils Relative: 2 %
HCT: 43.9 % (ref 36.0–46.0)
Hemoglobin: 13.8 g/dL (ref 12.0–15.0)
Immature Granulocytes: 0 %
Lymphocytes Relative: 23 %
Lymphs Abs: 3 K/uL (ref 0.7–4.0)
MCH: 26.1 pg (ref 26.0–34.0)
MCHC: 31.4 g/dL (ref 30.0–36.0)
MCV: 83 fL (ref 80.0–100.0)
Monocytes Absolute: 0.6 K/uL (ref 0.1–1.0)
Monocytes Relative: 4 %
Neutro Abs: 9.4 K/uL — ABNORMAL HIGH (ref 1.7–7.7)
Neutrophils Relative %: 70 %
Platelets: 328 K/uL (ref 150–400)
RBC: 5.29 MIL/uL — ABNORMAL HIGH (ref 3.87–5.11)
RDW: 14.5 % (ref 11.5–15.5)
WBC: 13.3 K/uL — ABNORMAL HIGH (ref 4.0–10.5)
nRBC: 0 % (ref 0.0–0.2)

## 2023-12-05 LAB — COMPREHENSIVE METABOLIC PANEL WITH GFR
ALT: 12 U/L (ref 0–44)
AST: 13 U/L — ABNORMAL LOW (ref 15–41)
Albumin: 3.6 g/dL (ref 3.5–5.0)
Alkaline Phosphatase: 92 U/L (ref 38–126)
Anion gap: 12 (ref 5–15)
BUN: 11 mg/dL (ref 6–20)
CO2: 23 mmol/L (ref 22–32)
Calcium: 8.9 mg/dL (ref 8.9–10.3)
Chloride: 98 mmol/L (ref 98–111)
Creatinine, Ser: 1.16 mg/dL — ABNORMAL HIGH (ref 0.44–1.00)
GFR, Estimated: 56 mL/min — ABNORMAL LOW (ref >60)
Glucose, Bld: 308 mg/dL — ABNORMAL HIGH (ref 70–99)
Potassium: 4.3 mmol/L (ref 3.5–5.1)
Sodium: 133 mmol/L — ABNORMAL LOW (ref 135–145)
Total Bilirubin: 0.8 mg/dL (ref 0.0–1.2)
Total Protein: 6.9 g/dL (ref 6.5–8.1)

## 2023-12-05 LAB — I-STAT CHEM 8, ED
BUN: 11 mg/dL (ref 6–20)
Calcium, Ion: 1.08 mmol/L — ABNORMAL LOW (ref 1.15–1.40)
Chloride: 101 mmol/L (ref 98–111)
Creatinine, Ser: 1.2 mg/dL — ABNORMAL HIGH (ref 0.44–1.00)
Glucose, Bld: 305 mg/dL — ABNORMAL HIGH (ref 70–99)
HCT: 43 % (ref 36.0–46.0)
Hemoglobin: 14.6 g/dL (ref 12.0–15.0)
Potassium: 4.7 mmol/L (ref 3.5–5.1)
Sodium: 136 mmol/L (ref 135–145)
TCO2: 24 mmol/L (ref 22–32)

## 2023-12-05 NOTE — ED Provider Triage Note (Signed)
 Emergency Medicine Provider Triage Evaluation Note  Amber [name] , a 53 y.o. female  was evaluated in triage.  Pt complains of dizziness. Patient with prior left pons stroke and right sided weakness presents to the ED with concerns of dizziness and increased right sided weakness. LKW around 9:30PM. She reports currently being on a blood thinner and takes as prescribed. No reported recent injury or trauma as far as she can recall. No LOC. Denies nausea, vomiting, chest pain, or SOB.  Review of Systems  Positive: As above Negative: As above  Physical Exam  BP (!) 147/78 (BP Location: Left Arm)   Pulse (!) 106   Temp 98.1 F (36.7 C)   Resp 16   Ht 5' 6 (1.676 m)   Wt 125.6 kg   LMP 04/19/2021 (Approximate)   SpO2 97%   BMI 44.71 kg/m  Gen:   Awake, no distress   Resp:  Normal effort  MSK:   Moves extremities without difficulty  Other:  Right sided deficits persist with 3/5 strength in RU and RL compared to LU and LL. Unclear if this is baseline for patient.  Medical Decision Making  Medically screening exam initiated at 6:20 PM.  Appropriate orders placed.  NYOMI HOWSER was informed that the remainder of the evaluation will be completed by another provider, this initial triage assessment does not replace that evaluation, and the importance of remaining in the ED until their evaluation is complete.     Deshon Hsiao A, PA-C 12/05/23 1820

## 2023-12-05 NOTE — ED Notes (Signed)
 Pt called for CT and no answer.

## 2023-12-05 NOTE — ED Triage Notes (Signed)
 Pt woke up this AM with a HA and dizziness that has persisted all day today. Pt LKW was when she went to bed last night at 21:30. Pt does not have any unilateral weakness, aphasia, dysarthria. Pt has a hx of CVA 3 years ago and her deficit is being off-balance and a slight R sided weakness.

## 2023-12-06 ENCOUNTER — Emergency Department (HOSPITAL_COMMUNITY)

## 2023-12-06 DIAGNOSIS — R9082 White matter disease, unspecified: Secondary | ICD-10-CM | POA: Diagnosis not present

## 2023-12-06 DIAGNOSIS — I69351 Hemiplegia and hemiparesis following cerebral infarction affecting right dominant side: Secondary | ICD-10-CM | POA: Diagnosis not present

## 2023-12-06 DIAGNOSIS — R531 Weakness: Secondary | ICD-10-CM | POA: Diagnosis not present

## 2023-12-06 DIAGNOSIS — R42 Dizziness and giddiness: Secondary | ICD-10-CM | POA: Diagnosis not present

## 2023-12-06 LAB — URINALYSIS, ROUTINE W REFLEX MICROSCOPIC
Bilirubin Urine: NEGATIVE
Glucose, UA: 50 mg/dL — AB
Hgb urine dipstick: NEGATIVE
Ketones, ur: NEGATIVE mg/dL
Nitrite: NEGATIVE
Protein, ur: 30 mg/dL — AB
Specific Gravity, Urine: 1.024 (ref 1.005–1.030)
WBC, UA: >50 WBC/hpf (ref 0–5)
pH: 5 (ref 5.0–8.0)

## 2023-12-06 MED ORDER — LORAZEPAM 1 MG PO TABS
1.0000 mg | ORAL_TABLET | Freq: Once | ORAL | Status: AC
  Administered 2023-12-06: 1 mg via ORAL
  Filled 2023-12-06: qty 1

## 2023-12-06 MED ORDER — MECLIZINE HCL 25 MG PO TABS
25.0000 mg | ORAL_TABLET | Freq: Once | ORAL | Status: AC
  Administered 2023-12-06: 25 mg via ORAL
  Filled 2023-12-06: qty 1

## 2023-12-06 MED ORDER — CEPHALEXIN 500 MG PO CAPS: 500.0000 mg | ORAL_CAPSULE | Freq: Four times a day (QID) | ORAL | 0 refills | Status: AC

## 2023-12-06 MED ORDER — MECLIZINE HCL 25 MG PO TABS: 25.0000 mg | ORAL_TABLET | Freq: Three times a day (TID) | ORAL | 0 refills | Status: DC | PRN

## 2023-12-06 MED ORDER — CEPHALEXIN 500 MG PO CAPS: 500.0000 mg | ORAL_CAPSULE | Freq: Four times a day (QID) | ORAL | 0 refills | Status: DC

## 2023-12-06 MED ORDER — CEFTRIAXONE SODIUM 1 G IJ SOLR
1.0000 g | Freq: Once | INTRAMUSCULAR | Status: AC
  Administered 2023-12-06: 1 g via INTRAVENOUS
  Filled 2023-12-06: qty 10

## 2023-12-06 MED ORDER — LACTATED RINGERS IV BOLUS
1000.0000 mL | Freq: Once | INTRAVENOUS | Status: AC
  Administered 2023-12-06: 1000 mL via INTRAVENOUS

## 2023-12-06 MED ORDER — MECLIZINE HCL 25 MG PO TABS: 25.0000 mg | ORAL_TABLET | Freq: Three times a day (TID) | ORAL | 0 refills | Status: AC | PRN

## 2023-12-06 NOTE — ED Notes (Signed)
 Pt ambulatory upon discharge, in no acute distress.

## 2023-12-06 NOTE — ED Provider Notes (Signed)
 I received the patient in signout.  This is a 53 year old with chronic right sided weakness presenting to the emergency department today with worsening weakness and dizziness.  She did have an MRI that did not show any new strokes.  Neurology consultation was pending at the time of signout.  I did discuss the patient's case with Dr. Voncile who reviewed the patient's imaging.  He recommended checking a urinalysis and giving the patient IV fluids as he suspects this is likely an exacerbation of her chronic symptoms.  These are ordered.  I will reevaluate for ultimate disposition.  The patient's symptoms have improved we will consider discharge.  Will also give her meclizine  in addition to the Ativan  she has received to see if this helps.  If she is still very symptomatic may require SNF placement for rehabilitation. Physical Exam  BP (!) 116/57   Pulse 80   Temp 98.4 F (36.9 C)   Resp 16   Ht 5' 6 (1.676 m)   Wt 125.6 kg   LMP 04/19/2021 (Approximate)   SpO2 96%   BMI 44.71 kg/m   Physical Exam General: No acute distress, ambulatory with steady gait after meclizine   Procedures  Procedures  ED Course / MDM    Medical Decision Making Amount and/or Complexity of Data Reviewed Labs: ordered. Radiology: ordered.  Risk Prescription drug management.   The patient's urinalysis does have findings concerning for urinary tract infection.  The patient is given Rocephin  as well as IV fluids here.  She did have significant improvement in her symptoms with the meclizine .  She is ambulatory with no more dizziness here.  I think she is stable for discharge.  She is discharged with return precautions.       Ula Prentice SAUNDERS, MD 12/06/23 [phone]

## 2023-12-06 NOTE — Inpatient Diabetes Management (Signed)
 Inpatient Diabetes Program Recommendations  AACE/ADA: New Consensus Statement on Inpatient Glycemic Control (2015)  Target Ranges:  Prepandial:   less than 140 mg/dL      Peak postprandial:   less than 180 mg/dL (1-2 hours)      Critically ill patients:  140 - 180 mg/dL   Lab Results  Component Value Date   GLUCAP 133 (H) 10/15/2020   HGBA1C 10.5 (A) 09/19/2023    Review of Glycemic Control  Latest Reference Range & Units 12/05/23 18:30  Glucose 70 - 99 mg/dL 691 (H)   Diabetes history: DM 2 Outpatient Diabetes medications: Novolog  40-50 units bid before meals, Tresiba  20 units bid, Mounjaro  5 mg Weekly, CBG meter Current orders for Inpatient glycemic control:  None being evaluated in ED awaiting Neurology consult  Glucose 308 on presentation  Inpatient Diabetes Program Recommendations:    -   CBGs Q4 if npo tid + hs if diet ordered -   A1c -   Consider Lantus  10 units bid (1/2 home dose) -   Novolog  0-15 units Q4 if NPO (tid + hs scale if diet ordered and swallow screen passed)  Thanks,  Clotilda Bull RN, MSN, BC-ADM Inpatient Diabetes Coordinator Team Pager [phone] (8a-5p)

## 2023-12-06 NOTE — ED Provider Notes (Signed)
 [place] EMERGENCY DEPARTMENT AT [facility] Provider Note   CSN: [id number] Arrival date & time: 12/05/23  1759     Patient presents with: Dizziness   Amber [name] is a 53 y.o. female.   The history is provided by the patient.  Dizziness  She has history of hypertension, diabetes, hyperlipidemia, stroke and comes in with concern of a new stroke.  Prior stroke was in the pons and left her with residual right-sided weakness and tendency to fall to the right.  2 nights ago, she was well when she went to bed but woke up with a severe left-sided headache and noted that she had some increased weakness on the right side of her body and she was more unsteady with a tendency to fall to the right.  She denies nausea or vomiting.    Prior to Admission medications   Medication Sig Start Date End Date Taking? Authorizing Provider  Accu-Chek Softclix Lancets lancets Use as instructed 11/02/21   Delores Suzann HERO, MD  acetaminophen  (TYLENOL ) 500 MG tablet Take 500 mg by mouth every 6 (six) hours as needed.    [provider]  amLODipine  (NORVASC ) 5 MG tablet Take 1 tablet (5 mg total) by mouth at bedtime. 08/13/22   Delores Suzann HERO, MD  aspirin  EC 81 MG tablet Take 1 tablet (81 mg total) by mouth daily. Swallow whole. 08/13/22   Delores Suzann HERO, MD  atorvastatin  (LIPITOR ) 80 MG tablet Take 1 tablet (80 mg total) by mouth at bedtime. 03/11/23   Delores Suzann HERO, MD  diclofenac  Sodium (VOLTAREN ) 1 % GEL Apply 4 g topically 4 (four) times daily as needed. Patient not taking: Reported on 12/25/2021 06/17/21   Austin Ade, MD  DULoxetine (CYMBALTA) 20 MG capsule Take 60 mg by mouth daily.    [provider]  EPINEPHrine  0.3 mg/0.3 mL IJ SOAJ injection Inject 0.3 mg into the muscle as needed for anaphylaxis. 09/19/23   Delores Suzann HERO, MD  famotidine  (PEPCID ) 20 MG tablet Take 1 tablet (20 mg total) by mouth 2 (two) times daily. 10/14/23   Delores Suzann HERO, MD  fluticasone   (FLONASE ) 50 MCG/ACT nasal spray Place 2 sprays into both nostrils daily. 07/07/23   Rumball, Alison M, DO  glucose blood (RELION GLUCOSE TEST STRIPS) test strip Use as instructed 11/02/21   Delores Suzann HERO, MD  insulin  aspart (NOVOLOG  FLEXPEN) 100 UNIT/ML FlexPen Inject 40-50 units twice daily with meals 11/29/23   Delores Suzann HERO, MD  insulin  degludec (TRESIBA  FLEXTOUCH) 100 UNIT/ML FlexTouch Pen Inject 20 Units into the skin 2 (two) times daily. 09/19/23   Delores Suzann HERO, MD  Insulin  Pen Needle 29G X 12.7MM MISC Use to inject insulin  four times daily. 09/05/23   Rumball, Alison M, DO  naltrexone  (DEPADE) 50 MG tablet Take 1 tablet (50 mg total) by mouth daily. 08/13/22   Delores Suzann HERO, MD  nystatin  (MYCOSTATIN /NYSTOP ) powder Apply 1 Application topically 3 (three) times daily. 07/19/22   Delores Suzann HERO, MD  nystatin -triamcinolone  ointment (MYCOLOG) Apply 1 Application topically 2 (two) times daily. 01/27/22   Delores Suzann HERO, MD  tirzepatide  (MOUNJARO ) 5 MG/0.5ML Pen Inject 5 mg into the skin once a week. 09/20/23   Delores Suzann HERO, MD  valsartan -hydrochlorothiazide  (DIOVAN  HCT) 160-25 MG tablet Take 1 tablet by mouth daily. 11/25/22   Delores Suzann HERO, MD    Allergies: Alpha-gal, Metformin, and Penicillins    Review of Systems  Neurological:  Positive for dizziness.  All other systems reviewed and are negative.   Updated Vital Signs BP 116/74   Pulse 88   Temp 98.4 F (36.9 C)   Resp 16   Ht 5' 6 (1.676 m)   Wt 125.6 kg   LMP 04/19/2021 (Approximate)   SpO2 97%   BMI 44.71 kg/m   Physical Exam Vitals and nursing note reviewed.   53 year old female, resting comfortably and in no acute distress. Vital signs are normal. Oxygen saturation is 97%, which is normal. Head is normocephalic and atraumatic. PERRLA, EOMI. There is no nystagmus. Neck is nontender and supple without adenopathy.  There are no carotid bruits. Lungs are clear without rales, wheezes, or rhonchi. Chest is  nontender. Heart has regular rate and rhythm without murmur. Extremities have no cyanosis or edema, full range of motion is present. Skin is warm and dry without rash. Neurologic: Awake and alert and oriented, speech is normal.  There is a questionable mild right central facial droop present.  Tongue protrudes in the midline.  There is slight weakness of the right arm compared with the left (she is right-handed), but she did have a residual right-sided weakness from her prior stroke.  There is very slight right pronator drift.  She is moderately ataxic on the right with finger-to-nose testing, minimally ataxic on the left.  On Romberg testing, she consistently tends to fall to the right.  (all labs ordered are listed, but only abnormal results are displayed) Labs Reviewed  CBC WITH DIFFERENTIAL/PLATELET - Abnormal; Notable for the following components:      Result Value   WBC 13.3 (*)    RBC 5.29 (*)    Neutro Abs 9.4 (*)    All other components within normal limits  COMPREHENSIVE METABOLIC PANEL WITH GFR - Abnormal; Notable for the following components:   Sodium 133 (*)    Glucose, Bld 308 (*)    Creatinine, Ser 1.16 (*)    AST 13 (*)    GFR, Estimated 56 (*)    All other components within normal limits  I-STAT CHEM 8, ED - Abnormal; Notable for the following components:   Creatinine, Ser 1.20 (*)    Glucose, Bld 305 (*)    Calcium , Ion 1.08 (*)    All other components within normal limits    Radiology: MR BRAIN WO CONTRAST Result Date: 12/06/2023 EXAM: MRI BRAIN WITHOUT CONTRAST 12/06/2023 06:19:19 AM TECHNIQUE: Multiplanar multisequence MRI of the head/brain was performed without the administration of intravenous contrast. COMPARISON: 09/24/2000 CLINICAL HISTORY: 53 y.o. female with prior left pons stroke and right sided weakness presents to the ED with concerns of dizziness and increased right sided weakness. LKW around 9:30PM. FINDINGS: BRAIN AND VENTRICLES: No acute infarct. No  intracranial hemorrhage. No mass. No midline shift. No hydrocephalus. The sella is unremarkable. Normal flow voids. Chronic lacunar infarct present within the left pons. Mild subcortical cerebral white matter disease present. ORBITS: No acute abnormality. SINUSES AND MASTOIDS: No acute abnormality. BONES AND SOFT TISSUES: Normal marrow signal. No acute soft tissue abnormality. IMPRESSION: 1. No acute intracranial abnormality. 2. Chronic lacunar infarct in the left pons. 3. Mild subcortical cerebral white matter disease. Electronically signed by: Evalene Coho MD 12/06/2023 06:32 AM EDT RP Workstation: [id number]   CT Head Wo Contrast Result Date: 12/05/2023 CLINICAL DATA:  Neuro deficit, acute, stroke suspected EXAM: CT HEAD WITHOUT CONTRAST TECHNIQUE: Contiguous axial images were obtained from the base of the skull through the vertex without intravenous contrast. RADIATION DOSE REDUCTION:  This exam was performed according to the departmental dose-optimization program which includes automated exposure control, adjustment of the mA and/or kV according to patient size and/or use of iterative reconstruction technique. COMPARISON:  None Available. FINDINGS: Brain: No evidence of large-territorial acute infarction. No parenchymal hemorrhage. No mass lesion. No extra-axial collection. No mass effect or midline shift. No hydrocephalus. Basilar cisterns are patent. Vascular: No hyperdense vessel. Atherosclerotic calcifications are present within the cavernous internal carotid arteries. Skull: No acute fracture or focal lesion. Sinuses/Orbits: Paranasal sinuses and mastoid air cells are clear. Bilateral lens replacement. Otherwise the orbits are unremarkable. Other: None. IMPRESSION: No acute intracranial abnormality. Electronically Signed   By: Morgane  Naveau M.D.   On: 12/05/2023 19:20     Procedures   Medications Ordered in the ED - No data to display                                  Medical Decision  Making Amount and/or Complexity of Data Reviewed Radiology: ordered.  Risk Prescription drug management.   New left-sided headache and right-sided weakness and ataxia concerning for new stroke.  I have reviewed her laboratory tests, my interpretation is mild renal insufficiency which is new compared with 01/25/2023, mild leukocytosis which is nonspecific.  CT of head shows no acute intracranial process.  I have independently viewed the images, and agree with radiologist's interpretation.  I have ordered MRI of the brain to rule out posterior circulation stroke.  I have due to her past records and note hospitalization on 09/24/2020 for pontine stroke with right-sided weakness and ataxia.  MRI shows old lacunar pontine infarct without any acute changes.  I have independently viewed the images, and agree with the radiologist's interpretation.  However, patient clearly has a change in her neurologic status.  I am requesting neurology consult.  Case is signed out to Dr. Ula     Final diagnoses:  Dizziness  Renal insufficiency    ED Discharge Orders     None          Raford Lenis, MD 12/06/23 [phone]

## 2023-12-06 NOTE — Discharge Instructions (Signed)
 Your workup today was reassuring overall.  It does appear that you have a urinary tract infection.  Please take the antibiotic as prescribed.  Take the meclizine  as needed for dizziness.  Follow-up with your doctor and return to the emergency department for worsening symptoms.

## 2023-12-09 ENCOUNTER — Telehealth: Payer: Self-pay | Admitting: Family Medicine

## 2023-12-09 NOTE — Telephone Encounter (Signed)
 Hi Risk manager,  Please schedule this patient for follow up with me for diabetes in the next 1 months.  Thanks, Otho Blitz, MD  [facility] Medicine Teaching Service

## 2023-12-11 ENCOUNTER — Encounter: Payer: Self-pay | Admitting: Family Medicine

## 2023-12-11 DIAGNOSIS — I10 Essential (primary) hypertension: Secondary | ICD-10-CM

## 2023-12-11 DIAGNOSIS — E785 Hyperlipidemia, unspecified: Secondary | ICD-10-CM

## 2023-12-12 MED ORDER — ATORVASTATIN CALCIUM 80 MG PO TABS
80.0000 mg | ORAL_TABLET | Freq: Every day | ORAL | 3 refills | Status: AC
Start: 2023-12-12 — End: ?

## 2023-12-12 MED ORDER — VALSARTAN-HYDROCHLOROTHIAZIDE 160-25 MG PO TABS
1.0000 | ORAL_TABLET | Freq: Every day | ORAL | 3 refills | Status: DC
Start: 2023-12-12 — End: 2024-01-23

## 2023-12-13 NOTE — Telephone Encounter (Signed)
 Rx sent.

## 2023-12-14 DIAGNOSIS — H26492 Other secondary cataract, left eye: Secondary | ICD-10-CM | POA: Diagnosis not present

## 2023-12-15 DIAGNOSIS — R296 Repeated falls: Secondary | ICD-10-CM | POA: Diagnosis not present

## 2023-12-15 DIAGNOSIS — G8191 Hemiplegia, unspecified affecting right dominant side: Secondary | ICD-10-CM | POA: Diagnosis not present

## 2023-12-15 DIAGNOSIS — E1159 Type 2 diabetes mellitus with other circulatory complications: Secondary | ICD-10-CM | POA: Diagnosis not present

## 2023-12-15 DIAGNOSIS — Z8673 Personal history of transient ischemic attack (TIA), and cerebral infarction without residual deficits: Secondary | ICD-10-CM | POA: Diagnosis not present

## 2023-12-26 ENCOUNTER — Encounter: Payer: Self-pay | Admitting: Family Medicine

## 2023-12-27 ENCOUNTER — Ambulatory Visit (INDEPENDENT_AMBULATORY_CARE_PROVIDER_SITE_OTHER)

## 2023-12-27 DIAGNOSIS — Z23 Encounter for immunization: Secondary | ICD-10-CM

## 2023-12-28 ENCOUNTER — Telehealth: Payer: Self-pay | Admitting: Pharmacist

## 2023-12-28 NOTE — Telephone Encounter (Signed)
 Patient contacted for evaluation of diabetes control and medication access in patient seen last by PCP 6/20205.    Chart Review revealed ED visit X2   7/25 and 9/25  Patient contacted and patient reports she NOW has all medications and is taking her  Mounjaro  (tirzepatide ), Novolog  (insulin  aspart) and Tresiba  (insulin  degludec) and notes glucose values in the high 100s fasting.   Also reports taking statin.   Next visit PCP, Dr. Delores 11/3 Suggest Direct LDL Repeat UACR and consideration of SGLT2 in addition to current medications if repeat A1C <9  Total time with patient call and documentation of interaction: 12 minutes.

## 2023-12-28 NOTE — Telephone Encounter (Signed)
 Reviewed and agree with Dr Rennis plan.

## 2023-12-29 ENCOUNTER — Encounter: Payer: Self-pay | Admitting: Family Medicine

## 2023-12-29 NOTE — Progress Notes (Signed)
 Patient requests flu vaccine and covid vaccine during mothers apt.  See admin for details.    Will forward to PCP to sign.

## 2024-01-02 NOTE — Progress Notes (Signed)
 Amber [name]                                          MRN: [id number]   01/02/2024   The VBCI Quality Team Specialist reviewed this patient medical record for the purposes of chart review for care gap closure. The following were reviewed: chart review for care gap closure-glycemic status assessment.    VBCI Quality Team

## 2024-01-04 ENCOUNTER — Encounter: Payer: Self-pay | Admitting: Family Medicine

## 2024-01-08 ENCOUNTER — Encounter: Payer: Self-pay | Admitting: Family Medicine

## 2024-01-10 ENCOUNTER — Encounter: Payer: Self-pay | Admitting: Family Medicine

## 2024-01-14 DIAGNOSIS — R296 Repeated falls: Secondary | ICD-10-CM | POA: Diagnosis not present

## 2024-01-14 DIAGNOSIS — E1159 Type 2 diabetes mellitus with other circulatory complications: Secondary | ICD-10-CM | POA: Diagnosis not present

## 2024-01-14 DIAGNOSIS — G8191 Hemiplegia, unspecified affecting right dominant side: Secondary | ICD-10-CM | POA: Diagnosis not present

## 2024-01-14 DIAGNOSIS — Z8673 Personal history of transient ischemic attack (TIA), and cerebral infarction without residual deficits: Secondary | ICD-10-CM | POA: Diagnosis not present

## 2024-01-19 NOTE — Progress Notes (Signed)
 The patient presented today with her mother for a visit. Her mother had to be transported to the Emergency Department. The patient went with her mother to the ED. As such, Kim's visit was not completed. Medications refilled, A1C reviewed. She will follow up this month for a full follow up.   Suzann Daring, MD  Family Medicine Teaching Service

## 2024-01-19 NOTE — Patient Instructions (Addendum)
 It was wonderful to see you today.  Please bring ALL of your medications with you to every visit.   Today we talked about:  STOP your Mounjaro  5 mg START Mounjaro  7.5 mg  I sent in your refills  Please return for a full visit   Please call to schedule your colonoscopy  Phone: [phone] I recommend you undergo a mammogram.   You can call to schedule an appointment by calling [phone].   Directions [address] [place], [place] [address]  Please let me know if you have questions. I will send you a letter or call you with results.     Thank you for choosing [facility].   Please call [phone] with any questions about today's appointment.  Please be sure to schedule follow up at the front  desk before you leave today.   Suzann Daring, MD  Family Medicine

## 2024-01-22 ENCOUNTER — Encounter: Payer: Self-pay | Admitting: Family Medicine

## 2024-01-23 ENCOUNTER — Encounter: Payer: Self-pay | Admitting: Family Medicine

## 2024-01-23 ENCOUNTER — Ambulatory Visit (INDEPENDENT_AMBULATORY_CARE_PROVIDER_SITE_OTHER): Admitting: Family Medicine

## 2024-01-23 VITALS — BP 126/86 | HR 100 | Ht 66.0 in | Wt 263.6 lb

## 2024-01-23 DIAGNOSIS — Z8673 Personal history of transient ischemic attack (TIA), and cerebral infarction without residual deficits: Secondary | ICD-10-CM

## 2024-01-23 DIAGNOSIS — I10 Essential (primary) hypertension: Secondary | ICD-10-CM

## 2024-01-23 DIAGNOSIS — E1159 Type 2 diabetes mellitus with other circulatory complications: Secondary | ICD-10-CM | POA: Diagnosis not present

## 2024-01-23 DIAGNOSIS — E785 Hyperlipidemia, unspecified: Secondary | ICD-10-CM

## 2024-01-23 DIAGNOSIS — Z1231 Encounter for screening mammogram for malignant neoplasm of breast: Secondary | ICD-10-CM

## 2024-01-23 LAB — POCT GLYCOSYLATED HEMOGLOBIN (HGB A1C): HbA1c, POC (controlled diabetic range): 9.4 % — AB (ref 0.0–7.0)

## 2024-01-23 MED ORDER — VALSARTAN-HYDROCHLOROTHIAZIDE 160-25 MG PO TABS: 1.0000 | ORAL_TABLET | Freq: Every day | ORAL | 3 refills | Status: DC

## 2024-01-23 MED ORDER — AMLODIPINE BESYLATE 5 MG PO TABS: 5.0000 mg | ORAL_TABLET | Freq: Every day | ORAL | 3 refills | Status: AC

## 2024-01-23 MED ORDER — INSULIN LISPRO (1 UNIT DIAL) 100 UNIT/ML (KWIKPEN): 40.0000 [IU] | PEN_INJECTOR | Freq: Three times a day (TID) | SUBCUTANEOUS | 11 refills | Status: AC

## 2024-01-23 MED ORDER — MOUNJARO 7.5 MG/0.5ML ~~LOC~~ SOAJ: 7.5000 mg | SUBCUTANEOUS | 1 refills | Status: DC

## 2024-01-23 MED ORDER — TRESIBA FLEXTOUCH 100 UNIT/ML ~~LOC~~ SOPN: 80.0000 [IU] | PEN_INJECTOR | Freq: Every day | SUBCUTANEOUS | 3 refills | Status: DC

## 2024-01-25 ENCOUNTER — Encounter: Payer: Self-pay | Admitting: Family Medicine

## 2024-01-27 NOTE — Telephone Encounter (Signed)
Called patient and discussed.   Terisa Starr, MD  Family Medicine Teaching Service

## 2024-01-28 ENCOUNTER — Encounter: Payer: Self-pay | Admitting: Family Medicine

## 2024-02-08 ENCOUNTER — Encounter: Payer: Self-pay | Admitting: Family Medicine

## 2024-02-08 MED ORDER — BASAGLAR KWIKPEN 100 UNIT/ML ~~LOC~~ SOPN: 40.0000 [IU] | PEN_INJECTOR | Freq: Two times a day (BID) | SUBCUTANEOUS | 3 refills | Status: DC

## 2024-02-13 ENCOUNTER — Other Ambulatory Visit: Payer: Self-pay | Admitting: Family Medicine

## 2024-02-13 MED ORDER — INSULIN GLARGINE 100 UNIT/ML SOLOSTAR PEN: 40.0000 [IU] | PEN_INJECTOR | Freq: Two times a day (BID) | SUBCUTANEOUS | 3 refills | Status: AC

## 2024-02-14 ENCOUNTER — Ambulatory Visit

## 2024-02-15 NOTE — Progress Notes (Signed)
 Amber [name]                                          MRN: [id number]   02/15/2024   The VBCI Quality Team Specialist reviewed this patient medical record for the purposes of chart review for care gap closure. The following were reviewed: chart review for care gap closure-glycemic status assessment.    VBCI Quality Team

## 2024-02-16 ENCOUNTER — Encounter: Payer: Self-pay | Admitting: Family Medicine

## 2024-02-22 ENCOUNTER — Encounter: Payer: Self-pay | Admitting: Family Medicine

## 2024-02-22 NOTE — Telephone Encounter (Signed)
 Please call patient and schedule visit to check in on diabetes and BP. Okay to be virtual.  Suzann Daring, MD  [facility] Medicine Teaching Service

## 2024-02-27 NOTE — Progress Notes (Signed)
 Amber [name]                                          MRN: [id number]   02/27/2024   The VBCI Quality Team Specialist reviewed this patient medical record for the purposes of chart review for care gap closure. The following were reviewed: chart review for care gap closure-glycemic status assessment.    VBCI Quality Team

## 2024-02-29 ENCOUNTER — Encounter: Payer: Self-pay | Admitting: Family Medicine

## 2024-02-29 DIAGNOSIS — I10 Essential (primary) hypertension: Secondary | ICD-10-CM

## 2024-03-02 ENCOUNTER — Encounter: Payer: Self-pay | Admitting: Family Medicine

## 2024-03-02 MED ORDER — VALSARTAN-HYDROCHLOROTHIAZIDE 160-25 MG PO TABS: 1.0000 | ORAL_TABLET | Freq: Every day | ORAL | 3 refills | Status: DC

## 2024-03-14 ENCOUNTER — Encounter: Payer: Self-pay | Admitting: Family Medicine

## 2024-03-14 DIAGNOSIS — I10 Essential (primary) hypertension: Secondary | ICD-10-CM

## 2024-03-14 MED ORDER — VALSARTAN-HYDROCHLOROTHIAZIDE 160-25 MG PO TABS: 1.0000 | ORAL_TABLET | Freq: Every day | ORAL | 3 refills | Status: AC

## 2024-03-27 ENCOUNTER — Ambulatory Visit
Admission: RE | Admit: 2024-03-27 | Discharge: 2024-03-27 | Disposition: A | Source: Ambulatory Visit | Attending: Family Medicine

## 2024-03-27 DIAGNOSIS — Z1231 Encounter for screening mammogram for malignant neoplasm of breast: Secondary | ICD-10-CM

## 2024-03-29 ENCOUNTER — Ambulatory Visit: Payer: Self-pay | Admitting: Family Medicine

## 2024-04-06 ENCOUNTER — Encounter: Payer: Self-pay | Admitting: Family Medicine

## 2024-04-10 ENCOUNTER — Encounter: Payer: Self-pay | Admitting: Family Medicine

## 2024-04-12 NOTE — Assessment & Plan Note (Signed)
 Discussed medications.

## 2024-04-12 NOTE — Assessment & Plan Note (Signed)
LDL today 

## 2024-04-12 NOTE — Assessment & Plan Note (Addendum)
 Discussed ESA Letter written by Psychiatry

## 2024-04-12 NOTE — Assessment & Plan Note (Signed)
 A1c today still elevated but CONGRATULATED on weight!  Discussed long term risks with diabetes Increased mounjaro  to 10 mg Continue insulin  at current doses, does not have log today Discussed CGM Follow up 1 month to reviewe BG and change insulin 

## 2024-04-12 NOTE — Progress Notes (Unsigned)
" ° ° °  SUBJECTIVE:   CHIEF COMPLAINT: Follow up, ESA, diabetes HPI:   Amber [name] is a 54 y.o.  with history notable for type 2 diabetes with hyperglycemia on insulin  complicated by prior CVA presenting for follow up.   Discussed the use of AI scribe software for clinical note transcription with the patient, who gave verbal consent to proceed.  History of Present Illness Glycemic control and diabetes management - Weight loss of 13 pounds since last visit with dietary changes and Mounjaro  - Hemoglobin A1c remains elevated at 9.9% - Uses Humalog  with meals a couple of times daily, primarily with largest meal - Uses Lantus  40 units twice daily - One hypoglycemic episode three weeks ago, treated with juice - Current medications include Mounjaro , naltrexone , Cymbalta 60 mg, and recently started Rexulti without significant side effects - No leg swelling, no recent falls, and no other significant medication side effects  Hypertension - Treated with amlodipine  and valsartan  HCT - Blood pressure readings remain stable  Psychological stress and coping - Increased stress related to mother's cognitive decline and impulsive behavior; considering guardianship for mother with Alzheimer's disease - Manages anxiety and food cravings with writing, playing the violin, and tapping techniques, which are helpful for emotional eating - Pip is doing well, roommate moving out      PERTINENT  PMH / PSH/Family/Social History : type 2 DM, depression, stroke in past with frequent falls due to disequlibrium   OBJECTIVE:   BP 136/73   Pulse (!) 105   Ht 5' 6 (1.676 m)   Wt 250 lb 6.4 oz (113.6 kg)   LMP 04/19/2021   SpO2 100%   BMI 40.42 kg/m   Today's weight:  Last Weight  Most recent update: 04/13/2024  9:11 AM    Weight  113.6 kg (250 lb 6.4 oz)            Review of prior weights: Filed Weights   04/13/24 0910  Weight: 250 lb 6.4 oz (113.6 kg)     Cardiac: Regular rate and rhythm.  Normal S1/S2. No murmurs, rubs, or gallops appreciated. Lungs: Clear bilaterally to ascultation.     ASSESSMENT/PLAN:   Assessment & Plan Uncontrolled diabetes mellitus with hyperglycemia, with long-term current use of insulin  (HCC) A1c today still elevated but CONGRATULATED on weight!  Discussed long term risks with diabetes Increased mounjaro  to 10 mg Continue insulin  at current doses, does not have log today Discussed CGM Follow up 1 month to reviewe BG and change insulin   History of CVA (cerebrovascular accident) Hyperlipidemia, unspecified hyperlipidemia type LDL today Primary hypertension BMP today  Moderate episode of recurrent major depressive disorder (HCC) Discussed ESA Letter written by Psychiatry  Need for hepatitis B screening test Triple screening ordered  Other social stressor VBCI referral for assistance with guardianship  Screening for malignant neoplasm of colon   HCM  Referral to GI for colonoscopy  Foot exam today   Suzann Daring, MD  Family Medicine Teaching Service  [facility]   "

## 2024-04-12 NOTE — Assessment & Plan Note (Addendum)
 BMP today

## 2024-04-13 ENCOUNTER — Ambulatory Visit: Admitting: Family Medicine

## 2024-04-13 ENCOUNTER — Encounter: Payer: Self-pay | Admitting: Family Medicine

## 2024-04-13 VITALS — BP 136/73 | HR 105 | Ht 66.0 in | Wt 250.4 lb

## 2024-04-13 DIAGNOSIS — Z8673 Personal history of transient ischemic attack (TIA), and cerebral infarction without residual deficits: Secondary | ICD-10-CM

## 2024-04-13 DIAGNOSIS — E1165 Type 2 diabetes mellitus with hyperglycemia: Secondary | ICD-10-CM

## 2024-04-13 DIAGNOSIS — E785 Hyperlipidemia, unspecified: Secondary | ICD-10-CM

## 2024-04-13 DIAGNOSIS — F331 Major depressive disorder, recurrent, moderate: Secondary | ICD-10-CM

## 2024-04-13 DIAGNOSIS — Z1211 Encounter for screening for malignant neoplasm of colon: Secondary | ICD-10-CM

## 2024-04-13 DIAGNOSIS — Z659 Problem related to unspecified psychosocial circumstances: Secondary | ICD-10-CM

## 2024-04-13 DIAGNOSIS — I10 Essential (primary) hypertension: Secondary | ICD-10-CM

## 2024-04-13 DIAGNOSIS — Z1159 Encounter for screening for other viral diseases: Secondary | ICD-10-CM | POA: Diagnosis not present

## 2024-04-13 DIAGNOSIS — Z794 Long term (current) use of insulin: Secondary | ICD-10-CM | POA: Diagnosis not present

## 2024-04-13 LAB — POCT GLYCOSYLATED HEMOGLOBIN (HGB A1C): HbA1c, POC (controlled diabetic range): 9.9 % — AB (ref 0.0–7.0)

## 2024-04-13 MED ORDER — MOUNJARO 10 MG/0.5ML ~~LOC~~ SOAJ: 10.0000 mg | SUBCUTANEOUS | 1 refills | Status: AC

## 2024-04-13 NOTE — Patient Instructions (Signed)
 It was wonderful to see you today.  Please bring ALL of your medications with you to every visit.   Today we talked about:    You are doing great with your lifestyle changes - Increase Mounjaro  to 10 mg--I sent this to the pharmacy - I recommend a continuous glucose monitor--I think you would like it - Follow up  in 1 month and bring your sugar log   I will message or call you with blood work   Please follow up in 1 months   Thank you for choosing [facility].   Please call [phone] with any questions about today's appointment.  Please be sure to schedule follow up at the front  desk before you leave today.   Suzann Daring, MD  Family Medicine

## 2024-04-13 NOTE — Progress Notes (Signed)
 Amber [name]                                          MRN: [id number]   04/13/2024   The VBCI Quality Team Specialist reviewed this patient medical record for the purposes of chart review for care gap closure. The following were reviewed: chart review for care gap closure-glycemic status assessment.    VBCI Quality Team

## 2024-04-14 ENCOUNTER — Encounter: Payer: Self-pay | Admitting: Family Medicine

## 2024-04-14 DIAGNOSIS — E1165 Type 2 diabetes mellitus with hyperglycemia: Secondary | ICD-10-CM

## 2024-04-14 LAB — CBC WITH DIFFERENTIAL/PLATELET
Basophils Absolute: 0.1 10*3/uL (ref 0.0–0.2)
Basos: 1 %
EOS (ABSOLUTE): 0.2 10*3/uL (ref 0.0–0.4)
Eos: 2 %
Hematocrit: 46.6 % (ref 34.0–46.6)
Hemoglobin: 14.8 g/dL (ref 11.1–15.9)
Immature Grans (Abs): 0 10*3/uL (ref 0.0–0.1)
Immature Granulocytes: 0 %
Lymphocytes Absolute: 2.6 10*3/uL (ref 0.7–3.1)
Lymphs: 27 %
MCH: 25.4 pg — ABNORMAL LOW (ref 26.6–33.0)
MCHC: 31.8 g/dL (ref 31.5–35.7)
MCV: 80 fL (ref 79–97)
Monocytes Absolute: 0.5 10*3/uL (ref 0.1–0.9)
Monocytes: 5 %
Neutrophils Absolute: 6.3 10*3/uL (ref 1.4–7.0)
Neutrophils: 65 %
Platelets: 367 10*3/uL (ref 150–450)
RBC: 5.82 x10E6/uL — ABNORMAL HIGH (ref 3.77–5.28)
RDW: 14.2 % (ref 11.7–15.4)
WBC: 9.8 10*3/uL (ref 3.4–10.8)

## 2024-04-14 LAB — BASIC METABOLIC PANEL WITH GFR
BUN/Creatinine Ratio: 24 — ABNORMAL HIGH (ref 9–23)
BUN: 20 mg/dL (ref 6–24)
CO2: 24 mmol/L (ref 20–29)
Calcium: 9.8 mg/dL (ref 8.7–10.2)
Chloride: 97 mmol/L (ref 96–106)
Creatinine, Ser: 0.85 mg/dL (ref 0.57–1.00)
Glucose: 318 mg/dL — ABNORMAL HIGH (ref 70–99)
Potassium: 4.1 mmol/L (ref 3.5–5.2)
Sodium: 138 mmol/L (ref 134–144)
eGFR: 81 mL/min/{1.73_m2}

## 2024-04-14 LAB — HEPATITIS B SURFACE ANTIGEN: Hepatitis B Surface Ag: NEGATIVE

## 2024-04-14 LAB — HEPATITIS B SURFACE ANTIBODY, QUANTITATIVE: Hepatitis B Surf Ab Quant: <3.5 m[IU]/mL — ABNORMAL LOW

## 2024-04-14 LAB — LDL CHOLESTEROL, DIRECT: LDL Direct: 124 mg/dL — ABNORMAL HIGH (ref 0–99)

## 2024-04-14 LAB — HEPATITIS B CORE ANTIBODY, TOTAL: Hep B Core Total Ab: NEGATIVE

## 2024-04-17 ENCOUNTER — Ambulatory Visit: Payer: Self-pay | Admitting: Family Medicine

## 2024-04-17 ENCOUNTER — Other Ambulatory Visit (HOSPITAL_COMMUNITY): Payer: Self-pay

## 2024-04-17 ENCOUNTER — Telehealth: Payer: Self-pay | Admitting: Pharmacist

## 2024-04-17 MED ORDER — FREESTYLE LIBRE 3 PLUS SENSOR MISC: 2 refills | Status: DC

## 2024-04-17 MED ORDER — DEXCOM G7 15 DAY SENSOR MISC: 1.0000 | 11 refills | Status: AC

## 2024-04-17 NOTE — Telephone Encounter (Signed)
 Attempted to contact patient for follow-up of $$$ of CGM sensors.   Left HIPAA compliant voice mail sharing that alternative device sent to her pharmacy. Assistance of coverage determination by Lavern Ku, CPhT.  Asked her to come to scheduled appointment in 3 days.  App download prior to appointment suggested - Dexcom G7 app  Total time with patient call and documentation of interaction: 12 minutes.

## 2024-04-19 ENCOUNTER — Encounter: Payer: Self-pay | Admitting: Pharmacist

## 2024-04-19 NOTE — Progress Notes (Signed)
 Pharmacy Quality Measure Review  This patient is appearing on the insurance-providing list for being at risk of failing the adherence measure for Statin Use in Persons with Diabetes (SUPD) medications this calendar year.  Per review of chart and payor information, patient has filled a medication indicated for diabetes this calendar year and currently filling a statin prescription.   Medication: atorvastatin  80 mg Last fill date: 03/13/24 for 90 day supply

## 2024-04-20 ENCOUNTER — Telehealth: Payer: Self-pay | Admitting: *Deleted

## 2024-04-20 ENCOUNTER — Encounter: Payer: Self-pay | Admitting: Pharmacist

## 2024-04-20 ENCOUNTER — Ambulatory Visit: Admitting: Pharmacist

## 2024-04-20 VITALS — BP 110/65 | HR 107 | Wt 248.2 lb

## 2024-04-20 DIAGNOSIS — E1165 Type 2 diabetes mellitus with hyperglycemia: Secondary | ICD-10-CM | POA: Diagnosis not present

## 2024-04-20 DIAGNOSIS — E785 Hyperlipidemia, unspecified: Secondary | ICD-10-CM | POA: Diagnosis not present

## 2024-04-20 DIAGNOSIS — Z794 Long term (current) use of insulin: Secondary | ICD-10-CM

## 2024-04-20 MED ORDER — EZETIMIBE 10 MG PO TABS: 10.0000 mg | ORAL_TABLET | Freq: Every day | ORAL | 3 refills | Status: AC

## 2024-04-20 NOTE — Progress Notes (Unsigned)
 Complex Care Management Note Care Guide Note  04/20/2024 Name: Amber [name] MRN: [id number] DOB: [birth year]   Complex Care Management Outreach Attempts: An unsuccessful telephone outreach was attempted today to offer the patient information about available complex care management services.  Follow Up Plan:  Additional outreach attempts will be made to offer the patient complex care management information and services.   Encounter Outcome:  No Answer  Harlene Satterfield  [facility]  [facility] Guide  Direct Dial : [phone]  Fax [phone]

## 2024-04-20 NOTE — Assessment & Plan Note (Signed)
 Diabetes longstanding currently uncontrolled with last A1c 9.9. Patient is able to verbalize appropriate hypoglycemia management plan. Medication adherence appears okay.  - Continued basal insulin  Lantus  (insulin  glargine) 40 units BID.  - Continued rapid insulin  Humalog  (insulin  lispro) 40 units TID.  - Continued GLP-1 Mounjaro  (tirzepatide ) 10 mg .  - Encouraged patient on her weight loss progress and lifestyle modification - Initiated Dexcom G7 in office today (Sample provided) - Patient educated on purpose, proper use, and potential adverse effects.  - Extensively discussed pathophysiology of diabetes, recommended lifestyle interventions, dietary effects on glucose control.  - Counseled on s/sx of and management of hypoglycemia.

## 2024-04-20 NOTE — Assessment & Plan Note (Signed)
-   Primary prevention in patient with diabetes. Last LDL 124 mg/dl is not at goal of <29 mg/dL. High intensity statin indicated.  - Continued atorvastatin  80 mg daily.  - Started ezetimibe  10 mg daily

## 2024-04-20 NOTE — Progress Notes (Signed)
 "   S:     Chief Complaint  Patient presents with   Medication Management    Diabetes, Dexcom G7   54 y.o. female who presents for diabetes evaluation, education, and management. Patient arrives in good spirits and presents with assistance with  cane.   Patient was referred and last seen by Primary Care Provider, Amber [name], on 04/13/2024.  At last visit, increased Mounjaro  (tirzepatide ) to 10 mg weekly.   PMH is significant for Diabetes, hypertension, hyperlipidemia, depression.   Current diabetes medications include: Lantus  (insulin  glargine) 40 units BID, Humalog  (insulin  lispro) 40 units TID, Mounjaro  (tirzepatide ) 10 mg weekly. Current hypertension medications include: amlodipine  5 mg daily, valsartan -hydrochlorothiazide  160-25 mg daily. Current hyperlipidemia medications include: atorvastatin  80 mg daily  Patient reports adherence to taking all medications as prescribed.   Patient reported dietary habits: Eats 2-3 meals/day Protein limited due to alpha-gal. Tofu, fish, eggs   O:   Review of Systems  All other systems reviewed and are negative.   Physical Exam Constitutional:      Appearance: Normal appearance.  Neurological:     Mental Status: She is alert.  Psychiatric:        Mood and Affect: Mood normal.        Behavior: Behavior normal.        Thought Content: Thought content normal.        Judgment: Judgment normal.     Lab Results  Component Value Date   HGBA1C 9.9 (A) 04/13/2024   Vitals:   04/20/24 0944  BP: 110/65  Pulse: (!) 107  SpO2: 98%    Lipid Panel     Component Value Date/Time   CHOL 246 (H) 09/19/2023 1057   TRIG 344 (H) 09/19/2023 1057   HDL 37 (L) 09/19/2023 1057   CHOLHDL 6.6 (H) 09/19/2023 1057   CHOLHDL 7.1 09/25/2020 0348   VLDL 75 (H) 09/25/2020 0348   LDLCALC 146 (H) 09/19/2023 1057   LDLDIRECT 124 (H) 04/13/2024 0932    Clinical Atherosclerotic Cardiovascular Disease (ASCVD): No  The ASCVD Risk score (Arnett DK, et  al., 2019) failed to calculate for the following reasons:   Risk score cannot be calculated because patient has a medical history suggesting prior/existing ASCVD   * - Cholesterol units were assumed  Lab Results  Component Value Date   CHOL 246 (H) 09/19/2023   HDL 37 (L) 09/19/2023   LDLCALC 146 (H) 09/19/2023   LDLDIRECT 124 (H) 04/13/2024   TRIG 344 (H) 09/19/2023   CHOLHDL 6.6 (H) 09/19/2023    Lab Results  Component Value Date   CREATININE 0.85 04/13/2024   BUN 20 04/13/2024   NA 138 04/13/2024   K 4.1 04/13/2024   CL 97 04/13/2024   CO2 24 04/13/2024    Medications Reviewed Today     Reviewed by Isrrael Fluckiger G, RPH-CPP (Pharmacist) on 04/20/24 at [phone]  Med List Status: <None>   Medication Order Taking? Sig Documenting Provider Last Dose Status Informant  Accu-Chek Softclix Lancets lancets [id number]  Use as instructed [name] Suzann HERO, MD  Active   acetaminophen  (TYLENOL ) 500 MG tablet [id number] Yes Take 500 mg by mouth every 6 (six) hours as needed. [provider]  Active            Med Note MAUDIE, MAUDE KANDICE Kitchens May 25, 2021  9:50 AM) Only using PRN  amLODipine  (NORVASC ) 5 MG tablet [id number] Yes Take 1 tablet (5 mg total) by mouth at bedtime.  [name] Suzann HERO, MD  Active   aspirin  EC 81 MG tablet [id number] Yes Take 1 tablet (81 mg total) by mouth daily. Swallow whole. [name] Suzann HERO, MD  Active   atorvastatin  (LIPITOR ) 80 MG tablet [id number] Yes Take 1 tablet (80 mg total) by mouth at bedtime. [name] Suzann HERO, MD  Active   brexpiprazole (REXULTI) 1 MG TABS tablet [id number] Yes Take by mouth daily. [provider]  Active   cephALEXin  (KEFLEX ) 500 MG capsule [id number] Yes Take 1 capsule (500 mg total) by mouth 4 (four) times daily. Ula Prentice SAUNDERS, MD  Active   Continuous Glucose Sensor [facility] G7 15 DAY SENSOR) [place] [id number]  1 Device by Does not apply route as directed. Apply new sensor every 15 days. Rumball, Alison M, DO  Active   diclofenac  Sodium  (VOLTAREN ) 1 % GEL [id number]  Apply 4 g topically 4 (four) times daily as needed.  Patient not taking: Reported on 04/20/2024   Austin Ade, MD  Active   DULoxetine (CYMBALTA) 20 MG capsule [id number] Yes Take 60 mg by mouth daily. [provider]  Active   EPINEPHrine  0.3 mg/0.3 mL IJ SOAJ injection [id number] Yes Inject 0.3 mg into the muscle as needed for anaphylaxis. [name] Suzann HERO, MD  Active   famotidine  (PEPCID ) 20 MG tablet [id number] Yes Take 1 tablet (20 mg total) by mouth 2 (two) times daily. [name] Suzann HERO, MD  Active   fluticasone  (FLONASE ) 50 MCG/ACT nasal spray [id number] Yes Place 2 sprays into both nostrils daily. Rumball, Alison M, DO  Active   glucose blood (RELION GLUCOSE TEST STRIPS) test strip [id number]  Use as instructed [name] Suzann HERO, MD  Active   insulin  aspart (NOVOLOG  FLEXPEN) 100 UNIT/ML FlexPen [id number] Yes Inject 40-50 units twice daily with meals [name] Suzann HERO, MD  Active   insulin  glargine (LANTUS ) 100 UNIT/ML Solostar Pen [id number] Yes Inject 40 Units into the skin 2 (two) times daily. [name] Suzann HERO, MD  Active   insulin  lispro (HUMALOG ) 100 UNIT/ML KwikPen [id number] Yes Inject 40 Units into the skin 3 (three) times daily with meals. Inject 15 minutes before meal [name] Suzann HERO, MD  Active   Insulin  Pen Needle 29G X 12.7MM MISC [id number]  Use to inject insulin  four times daily. Rumball, Alison M, DO  Active   meclizine  (ANTIVERT ) 25 MG tablet [id number] Yes Take 1 tablet (25 mg total) by mouth 3 (three) times daily as needed for dizziness. Ula Prentice SAUNDERS, MD  Active   naltrexone  (DEPADE) 50 MG tablet [id number] Yes Take 1 tablet (50 mg total) by mouth daily. [name] Suzann HERO, MD  Active   nystatin  (MYCOSTATIN /NYSTOP ) powder [id number] Yes Apply 1 Application topically 3 (three) times daily.  Patient taking differently: Apply 1 Application topically daily as needed.   [name] Suzann HERO, MD  Active   nystatin -triamcinolone  ointment [facility]) [id number] Yes Apply  1 Application topically 2 (two) times daily.  Patient taking differently: Apply 1 Application topically daily as needed.   [name] Suzann HERO, MD  Active   tirzepatide  (MOUNJARO ) 10 MG/0.5ML Pen [id number] Yes Inject 10 mg into the skin once a week. [name] Suzann HERO, MD  Active   valsartan -hydrochlorothiazide  (DIOVAN  HCT) 160-25 MG tablet [id number] Yes Take 1 tablet by mouth daily. Donzetta Rollene BRAVO, MD  Active                A/P: Diabetes longstanding currently uncontrolled with last A1c 9.9. Patient is able to verbalize appropriate hypoglycemia management  plan. Medication adherence appears okay.  - Continued basal insulin  Lantus  (insulin  glargine) 40 units BID.  - Continued rapid insulin  Humalog  (insulin  lispro) 40 units TID.  - Continued GLP-1 Mounjaro  (tirzepatide ) 10 mg .  - Encouraged patient on her weight loss progress and lifestyle modification - Initiated Dexcom G7 in office today (Sample provided) - Patient educated on purpose, proper use, and potential adverse effects.  - Extensively discussed pathophysiology of diabetes, recommended lifestyle interventions, dietary effects on glucose control.  - Counseled on s/sx of and management of hypoglycemia.    ASCVD risk - Primary prevention in patient with diabetes. Last LDL 124 mg/dl is not at goal of <29 mg/dL. High intensity statin indicated.  - Continued atorvastatin  80 mg daily.  - Started ezetimibe  10 mg daily  Hypertension longstanding currently well controlled office Blood Pressure 110/65. Blood pressure goal of <130/80 mmHg. Medication adherence good.  - Continued amlodipine  5 mg daily, valsartan -hydrochlorothiazide  160-25 mg daily.  Written patient instructions provided. Patient verbalized understanding of treatment plan.  Total time in face to face counseling 37 minutes.    Follow-up:  Pharmacist visit TBD 4-6 weeks to help with CGM review, education and diabetes medication adjustment PCP clinic visit  05/08/2024 Patient seen with Sabra Schuller, PharmD Candidate - PY2 student    "

## 2024-04-23 ENCOUNTER — Encounter: Payer: Self-pay | Admitting: Family Medicine

## 2024-04-23 NOTE — Progress Notes (Signed)
 Reviewed and agree with Dr Rennis plan.

## 2024-04-30 ENCOUNTER — Ambulatory Visit

## 2024-05-08 ENCOUNTER — Ambulatory Visit: Admitting: Family Medicine

## 2024-05-15 ENCOUNTER — Telehealth: Admitting: Licensed Clinical Social Worker
